# Patient Record
Sex: Female | Born: 1935
Health system: Southern US, Community
[De-identification: ages and names within clinical notes are randomized; demographics above are authoritative.]

## PROBLEM LIST (undated history)

## (undated) DIAGNOSIS — Z8619 Personal history of other infectious and parasitic diseases: Secondary | ICD-10-CM

## (undated) DIAGNOSIS — E785 Hyperlipidemia, unspecified: Secondary | ICD-10-CM

## (undated) DIAGNOSIS — C9110 Chronic lymphocytic leukemia of B-cell type not having achieved remission: Secondary | ICD-10-CM

## (undated) DIAGNOSIS — I10 Essential (primary) hypertension: Secondary | ICD-10-CM

## (undated) DIAGNOSIS — Z860100 Personal history of colon polyps, unspecified: Secondary | ICD-10-CM

## (undated) DIAGNOSIS — B0229 Other postherpetic nervous system involvement: Secondary | ICD-10-CM

## (undated) DIAGNOSIS — C73 Malignant neoplasm of thyroid gland: Secondary | ICD-10-CM

## (undated) DIAGNOSIS — B029 Zoster without complications: Secondary | ICD-10-CM

## (undated) HISTORY — DX: Hyperlipidemia, unspecified: E78.5

## (undated) HISTORY — PX: CHOLECYSTECTOMY: SHX55

## (undated) HISTORY — PX: HEMORRHOID SURGERY: SHX153

## (undated) HISTORY — PX: APPENDECTOMY: SHX54

## (undated) HISTORY — PX: ABDOMINAL HYSTERECTOMY: SHX81

## (undated) HISTORY — DX: Personal history of other infectious and parasitic diseases: Z86.19

## (undated) HISTORY — DX: Chronic lymphocytic leukemia of B-cell type not having achieved remission: C91.10

## (undated) HISTORY — PX: BREAST SURGERY: SHX581

## (undated) HISTORY — DX: Personal history of colonic polyps: Z86.010

## (undated) HISTORY — DX: Zoster without complications: B02.9

## (undated) HISTORY — DX: Essential (primary) hypertension: I10

## (undated) HISTORY — DX: Other postherpetic nervous system involvement: B02.29

## (undated) HISTORY — DX: Malignant neoplasm of thyroid gland: C73

## (undated) HISTORY — DX: Personal history of colon polyps, unspecified: Z86.0100

---

## 1996-12-28 DIAGNOSIS — C73 Malignant neoplasm of thyroid gland: Secondary | ICD-10-CM

## 1996-12-28 HISTORY — DX: Malignant neoplasm of thyroid gland: C73

## 1996-12-28 HISTORY — PX: TOTAL THYROIDECTOMY: SHX2547

## 2006-07-01 ENCOUNTER — Ambulatory Visit: Payer: Self-pay | Admitting: Gastroenterology

## 2006-10-11 ENCOUNTER — Ambulatory Visit: Payer: Self-pay | Admitting: Unknown Physician Specialty

## 2006-10-19 ENCOUNTER — Ambulatory Visit: Payer: Self-pay | Admitting: Unknown Physician Specialty

## 2008-05-14 ENCOUNTER — Observation Stay: Payer: Self-pay | Admitting: Internal Medicine

## 2008-05-14 ENCOUNTER — Other Ambulatory Visit: Payer: Self-pay

## 2008-05-18 ENCOUNTER — Ambulatory Visit: Payer: Self-pay | Admitting: Cardiovascular Disease

## 2008-05-18 HISTORY — PX: CARDIAC CATHETERIZATION: SHX172

## 2009-08-19 ENCOUNTER — Ambulatory Visit: Payer: Self-pay | Admitting: Gastroenterology

## 2009-11-18 LAB — HM COLONOSCOPY

## 2010-02-08 ENCOUNTER — Emergency Department: Payer: Self-pay | Admitting: Emergency Medicine

## 2013-05-18 ENCOUNTER — Ambulatory Visit (INDEPENDENT_AMBULATORY_CARE_PROVIDER_SITE_OTHER): Payer: Medicare Other | Admitting: General Surgery

## 2013-05-18 ENCOUNTER — Encounter: Payer: Self-pay | Admitting: General Surgery

## 2013-05-18 VITALS — BP 140/73 | HR 70 | Resp 14 | Ht 62.0 in | Wt 174.0 lb

## 2013-05-18 DIAGNOSIS — R928 Other abnormal and inconclusive findings on diagnostic imaging of breast: Secondary | ICD-10-CM

## 2013-05-18 NOTE — Progress Notes (Signed)
Patient ID: Lisa Figueroa, female   DOB: 1932/12/02, 79 y.o.   MRN: [id number]  Chief Complaint  Patient presents with  . Breast Problem    abnormal mammogram    HPI Lisa Figueroa is a 79 y.o. female.  Patient her today for breast evaluation referred by Dr Lacie Scotts.  Denies family history of breast cancer. Patient does self breast checks and gets regular mammograms. She has had prior cysts that were aspirated as well as excised several years ago. The most recent mammogram was done on 05/12/13 with a birad category 4.  HPI  Past Medical History  Diagnosis Date  . Cancer 1998    THYROID    Past Surgical History  Procedure Laterality Date  . Cholecystectomy    . Abdominal hysterectomy    . Hemorrhoid surgery      History reviewed. No pertinent family history.  Social History History  Substance Use Topics  . Smoking status: Never Smoker   . Smokeless tobacco: Not on file  . Alcohol Use: Yes     Comment: wine    Allergies  Allergen Reactions  . Prednisone     crys and can't sleep    Current Outpatient Prescriptions  Medication Sig Dispense Refill  . Cholecalciferol (HM VITAMIN D3) 2000 UNITS CAPS Take 1 capsule by mouth daily.      Marland Kitchen levothyroxine (SYNTHROID, LEVOTHROID) 125 MCG tablet Take 1 tablet by mouth daily.      Marland Kitchen losartan (COZAAR) 100 MG tablet Take 1 tablet by mouth daily.      . simvastatin (ZOCOR) 20 MG tablet Take 1 tablet by mouth daily.       No current facility-administered medications for this visit.    Review of Systems Review of Systems  Constitutional: Negative.   Respiratory: Negative.   Cardiovascular: Negative.     Blood pressure 140/73, pulse 70, resp. rate 14, height 5\' 2"  (1.575 m), weight 174 lb (78.926 kg).  Physical Exam Physical Exam  Constitutional: She appears well-developed and well-nourished.  Eyes: Conjunctivae are normal. No scleral icterus.  Neck: Trachea normal. No mass and no thyromegaly present.  Cardiovascular:  Normal rate, regular rhythm, normal heart sounds and normal pulses.   No murmur heard. Pulmonary/Chest: Effort normal and breath sounds normal. Right breast exhibits no inverted nipple, no mass, no nipple discharge, no skin change and no tenderness. Left breast exhibits no inverted nipple, no mass, no nipple discharge, no skin change and no tenderness. Breasts are symmetrical.  Abdominal: Soft. Normal appearance and bowel sounds are normal. There is no hepatosplenomegaly. There is no tenderness. No hernia.  Lymphadenopathy:    She has no cervical adenopathy.    She has no axillary adenopathy.    Data Reviewed Mammogram and ultrasound reviewed. Bilateral axillary nodes were identified with some of them showing mild increase prominence from before. None of the nodes are palpable. Breast tissue appears unremarkable. Assessment    Would like some additional opinions from other Radiologists and decision on a short term follow up or proceed with a biopsy.      Plan    Follow up to be announced within a week.         Greta Doom F 05/18/2013, 3:19 PM

## 2013-05-18 NOTE — Patient Instructions (Addendum)
The patient will be contacted by our office once Dr. Evette Cristal has gotten a chance to review findings with radiologists.

## 2013-05-28 ENCOUNTER — Ambulatory Visit: Payer: Self-pay | Admitting: Oncology

## 2013-05-31 ENCOUNTER — Ambulatory Visit: Payer: Self-pay | Admitting: Ophthalmology

## 2013-05-31 LAB — CREATININE, SERUM
Creatinine: 0.84 mg/dL (ref 0.60–1.30)
EGFR (African American): >60
EGFR (Non-African Amer.): >60

## 2013-06-01 ENCOUNTER — Ambulatory Visit (INDEPENDENT_AMBULATORY_CARE_PROVIDER_SITE_OTHER): Payer: Medicare Other | Admitting: General Surgery

## 2013-06-01 ENCOUNTER — Encounter: Payer: Self-pay | Admitting: General Surgery

## 2013-06-01 VITALS — BP 130/72 | HR 84 | Resp 12 | Ht 62.0 in | Wt 172.0 lb

## 2013-06-01 DIAGNOSIS — R2231 Localized swelling, mass and lump, right upper limb: Secondary | ICD-10-CM

## 2013-06-01 DIAGNOSIS — N63 Unspecified lump in unspecified breast: Secondary | ICD-10-CM

## 2013-06-01 DIAGNOSIS — R2230 Localized swelling, mass and lump, unspecified upper limb: Secondary | ICD-10-CM | POA: Insufficient documentation

## 2013-06-01 NOTE — Patient Instructions (Signed)
Pt will be advised when pathology available.

## 2013-06-01 NOTE — Progress Notes (Signed)
Patient ID: Gabrille Kilbride, female   DOB: 06-24-1932, 79 y.o.   MRN: [id number]  Chief Complaint  Patient presents with  . Other    FNA or core biopsy lymph node axilla    HPI Twanda Stakes is a 79 y.o. female who presents for a right breast biopsy.  HPI  Past Medical History  Diagnosis Date  . Cancer 1998    THYROID    Past Surgical History  Procedure Laterality Date  . Cholecystectomy    . Abdominal hysterectomy    . Hemorrhoid surgery    . Total thyroidectomy  1998  . Appendectomy    . Breast surgery      cyst excision    Family History  Problem Relation Age of Onset  . Cancer Brother     prostate and bone  . Cancer Maternal Aunt     breast  . Cancer Cousin     breast - maternal side    Social History History  Substance Use Topics  . Smoking status: Never Smoker   . Smokeless tobacco: Not on file  . Alcohol Use: Yes     Comment: wine    Allergies  Allergen Reactions  . Prednisone     crys and can't sleep    Current Outpatient Prescriptions  Medication Sig Dispense Refill  . Cholecalciferol (HM VITAMIN D3) 2000 UNITS CAPS Take 1 capsule by mouth daily.      Marland Kitchen levothyroxine (SYNTHROID, LEVOTHROID) 125 MCG tablet Take 1 tablet by mouth daily.      Marland Kitchen losartan (COZAAR) 100 MG tablet Take 1 tablet by mouth daily.      . simvastatin (ZOCOR) 20 MG tablet Take 1 tablet by mouth daily.       No current facility-administered medications for this visit.    Review of Systems Review of Systems  Constitutional: Negative.   Respiratory: Negative.   Cardiovascular: Negative.     Blood pressure 130/72, pulse 84, resp. rate 12, height 5\' 2"  (1.575 m), weight 172 lb (78.019 kg).  Physical Exam Physical Exam  Data Reviewed After further review of her mammogram and [place] deciced on biopsy of a node in right axillary tail area .  Assessment    Axillary adenopathy     Plan    Core biopsy completed.        SANKAR,SEEPLAPUTHUR G 06/01/2013, 8:15  PM

## 2013-06-06 ENCOUNTER — Telehealth: Payer: Self-pay | Admitting: *Deleted

## 2013-06-06 ENCOUNTER — Telehealth: Payer: Self-pay | Admitting: General Surgery

## 2013-06-06 DIAGNOSIS — C9110 Chronic lymphocytic leukemia of B-cell type not having achieved remission: Secondary | ICD-10-CM

## 2013-06-06 NOTE — Telephone Encounter (Signed)
Path report from rt axillary node suggested possible CLL. CBC done in March showed 49% lymphocytes. This report forwarded to Pathologist. Pt advised of the findings. She is agreeable to seen oncologist-her husband had seen Dr. Doylene Canning. Appt to be made with him.  Copies of path report, labs and Office notes to Dr. Doylene Canning and Dr. Lacie Scotts.

## 2013-06-06 NOTE — Telephone Encounter (Signed)
Patient has been scheduled for an appointment with Dr. Doylene Canning at the [facility]) on 06-09-13 at 9 am. She is aware of date, time, and instructions.

## 2013-06-07 ENCOUNTER — Encounter: Payer: Self-pay | Admitting: General Surgery

## 2013-06-08 LAB — PATHOLOGY

## 2013-06-09 ENCOUNTER — Ambulatory Visit: Payer: Self-pay | Admitting: Oncology

## 2013-06-09 LAB — COMPREHENSIVE METABOLIC PANEL
Alkaline Phosphatase: 72 U/L (ref 50–136)
Anion Gap: 7 (ref 7–16)
BUN: 14 mg/dL (ref 7–18)
Co2: 32 mmol/L (ref 21–32)
Creatinine: 0.74 mg/dL (ref 0.60–1.30)
EGFR (African American): >60
EGFR (Non-African Amer.): >60
Glucose: 90 mg/dL (ref 65–99)
Osmolality: 276 (ref 275–301)
SGOT(AST): 17 U/L (ref 15–37)
Sodium: 138 mmol/L (ref 136–145)

## 2013-06-09 LAB — CBC CANCER CENTER
Basophil #: 0.1 x10 3/mm (ref 0.0–0.1)
Eosinophil #: 0.1 x10 3/mm (ref 0.0–0.7)
Eosinophil %: 0.9 %
HCT: 41.6 % (ref 35.0–47.0)
Lymphocyte %: 54 %
MCHC: 32.4 g/dL (ref 32.0–36.0)
Monocyte #: 0.3 x10 3/mm (ref 0.2–0.9)
Neutrophil #: 4.4 x10 3/mm (ref 1.4–6.5)
Platelet: 178 x10 3/mm (ref 150–440)
RDW: 14.1 % (ref 11.5–14.5)

## 2013-06-27 ENCOUNTER — Inpatient Hospital Stay: Payer: Self-pay | Admitting: Student

## 2013-06-27 ENCOUNTER — Ambulatory Visit: Payer: Self-pay | Admitting: Oncology

## 2013-06-27 LAB — URINALYSIS, COMPLETE
Bacteria: NONE SEEN
Bilirubin,UR: NEGATIVE
Glucose,UR: NEGATIVE mg/dL
Ketone: NEGATIVE
Leukocyte Esterase: NEGATIVE
Nitrite: NEGATIVE
Ph: 6
Protein: 30
RBC,UR: 4 /HPF
Specific Gravity: 1.014
Squamous Epithelial: NONE SEEN
WBC UR: <1 /HPF

## 2013-06-27 LAB — COMPREHENSIVE METABOLIC PANEL
Alkaline Phosphatase: 83 U/L (ref 50–136)
Anion Gap: 5 — ABNORMAL LOW (ref 7–16)
Calcium, Total: 9.1 mg/dL (ref 8.5–10.1)
Co2: 29 mmol/L (ref 21–32)
Creatinine: 0.78 mg/dL (ref 0.60–1.30)
EGFR (African American): >60
EGFR (Non-African Amer.): >60
Glucose: 110 mg/dL — ABNORMAL HIGH (ref 65–99)
Osmolality: 269 (ref 275–301)
Potassium: 3.9 mmol/L (ref 3.5–5.1)
SGOT(AST): 27 U/L (ref 15–37)
Sodium: 134 mmol/L — ABNORMAL LOW (ref 136–145)
Total Protein: 7.5 g/dL (ref 6.4–8.2)

## 2013-06-27 LAB — TSH: Thyroid Stimulating Horm: 0.984 u[IU]/mL

## 2013-06-27 LAB — CBC
HCT: 42.1 %
HGB: 13.9 g/dL
MCH: 28.2 pg
MCHC: 32.9 g/dL
MCV: 86 fL
Platelet: 174 x10 3/mm 3
RBC: 4.92 X10 6/mm 3
RDW: 13.9 %
WBC: 22.8 x10 3/mm 3 — ABNORMAL HIGH

## 2013-06-27 LAB — PROTIME-INR
INR: 1
Prothrombin Time: 13.8 s

## 2013-06-28 LAB — CBC WITH DIFFERENTIAL/PLATELET
Basophil #: 0 10*3/uL (ref 0.0–0.1)
Eosinophil #: 0 10*3/uL (ref 0.0–0.7)
Eosinophil %: 0.5 %
HCT: 33.8 % — ABNORMAL LOW (ref 35.0–47.0)
Lymphocyte %: 39.6 %
MCH: 29.3 pg (ref 26.0–34.0)
MCHC: 34.1 g/dL (ref 32.0–36.0)
MCV: 86 fL (ref 80–100)
Monocyte #: 0.4 x10 3/mm (ref 0.2–0.9)
Monocyte %: 5.4 %
Platelet: 128 10*3/uL — ABNORMAL LOW (ref 150–440)
RBC: 3.93 10*6/uL (ref 3.80–5.20)
RDW: 14.1 % (ref 11.5–14.5)
WBC: 7.4 10*3/uL (ref 3.6–11.0)

## 2013-06-28 LAB — BASIC METABOLIC PANEL
Anion Gap: 3 — ABNORMAL LOW (ref 7–16)
BUN: 14 mg/dL (ref 7–18)
Chloride: 104 mmol/L (ref 98–107)
Co2: 31 mmol/L (ref 21–32)
Creatinine: 0.9 mg/dL (ref 0.60–1.30)
EGFR (Non-African Amer.): >60
Osmolality: 276 (ref 275–301)
Potassium: 4.4 mmol/L (ref 3.5–5.1)
Sodium: 138 mmol/L (ref 136–145)

## 2013-07-02 LAB — PLATELET COUNT: Platelet: 140 10*3/uL — ABNORMAL LOW (ref 150–440)

## 2013-07-04 ENCOUNTER — Encounter: Payer: Self-pay | Admitting: Internal Medicine

## 2013-07-04 ENCOUNTER — Ambulatory Visit: Payer: Self-pay | Admitting: Oncology

## 2013-07-10 LAB — URINALYSIS, COMPLETE
Bacteria: NONE SEEN
Bilirubin,UR: NEGATIVE
Glucose,UR: NEGATIVE mg/dL (ref 0–75)
Ketone: NEGATIVE
Leukocyte Esterase: NEGATIVE
Nitrite: NEGATIVE
Ph: 7 (ref 4.5–8.0)
Squamous Epithelial: 1

## 2013-07-28 ENCOUNTER — Encounter: Payer: Self-pay | Admitting: Internal Medicine

## 2013-09-05 ENCOUNTER — Ambulatory Visit: Payer: Self-pay | Admitting: Oncology

## 2013-09-07 ENCOUNTER — Ambulatory Visit: Payer: Self-pay | Admitting: Oncology

## 2013-09-07 LAB — CBC CANCER CENTER
Basophil #: 0.1 x10 3/mm (ref 0.0–0.1)
Basophil %: 0.8 %
Eosinophil #: 0.2 x10 3/mm (ref 0.0–0.7)
Eosinophil %: 1.3 %
HCT: 43.3 % (ref 35.0–47.0)
HGB: 14.2 g/dL (ref 12.0–16.0)
Lymphocyte #: 5.6 x10 3/mm — ABNORMAL HIGH (ref 1.0–3.6)
Lymphocyte %: 49.2 %
Monocyte #: 0.5 x10 3/mm (ref 0.2–0.9)
Neutrophil %: 44.3 %
Platelet: 221 x10 3/mm (ref 150–440)
WBC: 11.4 x10 3/mm — ABNORMAL HIGH (ref 3.6–11.0)

## 2013-09-07 LAB — COMPREHENSIVE METABOLIC PANEL
Albumin: 3.9 g/dL (ref 3.4–5.0)
Anion Gap: 7 (ref 7–16)
BUN: 9 mg/dL (ref 7–18)
Calcium, Total: 9.4 mg/dL (ref 8.5–10.1)
Creatinine: 0.78 mg/dL (ref 0.60–1.30)
EGFR (African American): >60
EGFR (Non-African Amer.): >60
Glucose: 83 mg/dL (ref 65–99)
Osmolality: 266 (ref 275–301)
Potassium: 3.9 mmol/L (ref 3.5–5.1)
SGOT(AST): 20 U/L (ref 15–37)
SGPT (ALT): 14 U/L (ref 12–78)
Sodium: 134 mmol/L — ABNORMAL LOW (ref 136–145)
Total Protein: 7.8 g/dL (ref 6.4–8.2)

## 2013-09-27 ENCOUNTER — Ambulatory Visit: Payer: Self-pay | Admitting: Oncology

## 2014-01-12 DIAGNOSIS — J209 Acute bronchitis, unspecified: Secondary | ICD-10-CM | POA: Diagnosis not present

## 2014-01-12 DIAGNOSIS — J449 Chronic obstructive pulmonary disease, unspecified: Secondary | ICD-10-CM | POA: Diagnosis not present

## 2014-01-12 DIAGNOSIS — J019 Acute sinusitis, unspecified: Secondary | ICD-10-CM | POA: Diagnosis not present

## 2014-01-19 DIAGNOSIS — E7800 Pure hypercholesterolemia, unspecified: Secondary | ICD-10-CM | POA: Diagnosis not present

## 2014-01-19 DIAGNOSIS — E785 Hyperlipidemia, unspecified: Secondary | ICD-10-CM | POA: Diagnosis not present

## 2014-01-19 DIAGNOSIS — E559 Vitamin D deficiency, unspecified: Secondary | ICD-10-CM | POA: Diagnosis not present

## 2014-01-19 DIAGNOSIS — R5381 Other malaise: Secondary | ICD-10-CM | POA: Diagnosis not present

## 2014-01-19 DIAGNOSIS — M810 Age-related osteoporosis without current pathological fracture: Secondary | ICD-10-CM | POA: Diagnosis not present

## 2014-01-19 DIAGNOSIS — I10 Essential (primary) hypertension: Secondary | ICD-10-CM | POA: Diagnosis not present

## 2014-01-19 DIAGNOSIS — J449 Chronic obstructive pulmonary disease, unspecified: Secondary | ICD-10-CM | POA: Diagnosis not present

## 2014-01-19 DIAGNOSIS — R5383 Other fatigue: Secondary | ICD-10-CM | POA: Diagnosis not present

## 2014-01-19 LAB — TSH: TSH: 2.79 u[IU]/mL (ref <=5.90)

## 2014-01-22 DIAGNOSIS — R918 Other nonspecific abnormal finding of lung field: Secondary | ICD-10-CM | POA: Diagnosis not present

## 2014-01-22 DIAGNOSIS — M949 Disorder of cartilage, unspecified: Secondary | ICD-10-CM | POA: Diagnosis not present

## 2014-01-22 DIAGNOSIS — M4000 Postural kyphosis, site unspecified: Secondary | ICD-10-CM | POA: Diagnosis not present

## 2014-01-22 DIAGNOSIS — M899 Disorder of bone, unspecified: Secondary | ICD-10-CM | POA: Diagnosis not present

## 2014-01-23 DIAGNOSIS — J209 Acute bronchitis, unspecified: Secondary | ICD-10-CM | POA: Diagnosis not present

## 2014-01-23 DIAGNOSIS — J449 Chronic obstructive pulmonary disease, unspecified: Secondary | ICD-10-CM | POA: Diagnosis not present

## 2014-01-30 DIAGNOSIS — I719 Aortic aneurysm of unspecified site, without rupture: Secondary | ICD-10-CM | POA: Diagnosis not present

## 2014-01-30 DIAGNOSIS — B029 Zoster without complications: Secondary | ICD-10-CM | POA: Diagnosis not present

## 2014-01-30 DIAGNOSIS — I739 Peripheral vascular disease, unspecified: Secondary | ICD-10-CM | POA: Diagnosis not present

## 2014-01-30 DIAGNOSIS — N289 Disorder of kidney and ureter, unspecified: Secondary | ICD-10-CM | POA: Diagnosis not present

## 2014-02-05 DIAGNOSIS — I719 Aortic aneurysm of unspecified site, without rupture: Secondary | ICD-10-CM | POA: Diagnosis not present

## 2014-02-05 DIAGNOSIS — B029 Zoster without complications: Secondary | ICD-10-CM | POA: Diagnosis not present

## 2014-02-05 DIAGNOSIS — I739 Peripheral vascular disease, unspecified: Secondary | ICD-10-CM | POA: Diagnosis not present

## 2014-02-05 DIAGNOSIS — E785 Hyperlipidemia, unspecified: Secondary | ICD-10-CM | POA: Diagnosis not present

## 2014-02-05 DIAGNOSIS — I10 Essential (primary) hypertension: Secondary | ICD-10-CM | POA: Diagnosis not present

## 2014-02-05 DIAGNOSIS — J449 Chronic obstructive pulmonary disease, unspecified: Secondary | ICD-10-CM | POA: Diagnosis not present

## 2014-02-07 ENCOUNTER — Inpatient Hospital Stay: Payer: Self-pay | Admitting: Internal Medicine

## 2014-02-07 DIAGNOSIS — B372 Candidiasis of skin and nail: Secondary | ICD-10-CM | POA: Diagnosis present

## 2014-02-07 DIAGNOSIS — E785 Hyperlipidemia, unspecified: Secondary | ICD-10-CM | POA: Diagnosis present

## 2014-02-07 DIAGNOSIS — E890 Postprocedural hypothyroidism: Secondary | ICD-10-CM | POA: Diagnosis present

## 2014-02-07 DIAGNOSIS — I10 Essential (primary) hypertension: Secondary | ICD-10-CM | POA: Diagnosis present

## 2014-02-07 DIAGNOSIS — C9110 Chronic lymphocytic leukemia of B-cell type not having achieved remission: Secondary | ICD-10-CM | POA: Diagnosis present

## 2014-02-07 DIAGNOSIS — B0229 Other postherpetic nervous system involvement: Secondary | ICD-10-CM | POA: Diagnosis present

## 2014-02-07 DIAGNOSIS — Z886 Allergy status to analgesic agent status: Secondary | ICD-10-CM | POA: Diagnosis not present

## 2014-02-07 DIAGNOSIS — L0390 Cellulitis, unspecified: Secondary | ICD-10-CM | POA: Diagnosis not present

## 2014-02-07 DIAGNOSIS — L02219 Cutaneous abscess of trunk, unspecified: Secondary | ICD-10-CM | POA: Diagnosis not present

## 2014-02-07 DIAGNOSIS — L0291 Cutaneous abscess, unspecified: Secondary | ICD-10-CM | POA: Diagnosis not present

## 2014-02-07 DIAGNOSIS — Z85850 Personal history of malignant neoplasm of thyroid: Secondary | ICD-10-CM | POA: Diagnosis not present

## 2014-02-07 DIAGNOSIS — B029 Zoster without complications: Secondary | ICD-10-CM | POA: Diagnosis not present

## 2014-02-07 DIAGNOSIS — E039 Hypothyroidism, unspecified: Secondary | ICD-10-CM | POA: Diagnosis not present

## 2014-02-07 DIAGNOSIS — K5900 Constipation, unspecified: Secondary | ICD-10-CM | POA: Diagnosis not present

## 2014-02-07 LAB — CBC WITH DIFFERENTIAL/PLATELET
Basophil: 1 %
COMMENT - H1-COM2: NORMAL
Comment - H1-Com1: NORMAL
Eosinophil: 2 %
HCT: 42.2 % (ref 35.0–47.0)
HGB: 14.5 g/dL (ref 12.0–16.0)
LYMPHS PCT: 28 %
MCH: 29.3 pg (ref 26.0–34.0)
MCHC: 34.2 g/dL (ref 32.0–36.0)
MCV: 86 fL (ref 80–100)
Monocytes: 7 %
PLATELETS: 159 10*3/uL (ref 150–440)
RBC: 4.93 10*6/uL (ref 3.80–5.20)
RDW: 13.6 % (ref 11.5–14.5)
Segmented Neutrophils: 57 %
VARIANT LYMPHOCYTE - H1-RLYMPH: 5 %
WBC: 10.7 10*3/uL (ref 3.6–11.0)

## 2014-02-07 LAB — COMPREHENSIVE METABOLIC PANEL
ALBUMIN: 3.5 g/dL (ref 3.4–5.0)
ALK PHOS: 94 U/L
ALT: 11 U/L — AB (ref 12–78)
Anion Gap: 5 — ABNORMAL LOW (ref 7–16)
BILIRUBIN TOTAL: 0.5 mg/dL (ref 0.2–1.0)
BUN: 13 mg/dL (ref 7–18)
CO2: 31 mmol/L (ref 21–32)
Calcium, Total: 9 mg/dL (ref 8.5–10.1)
Chloride: 93 mmol/L — ABNORMAL LOW (ref 98–107)
Creatinine: 0.88 mg/dL (ref 0.60–1.30)
EGFR (African American): >60
EGFR (Non-African Amer.): >60
Glucose: 97 mg/dL (ref 65–99)
Osmolality: 259 (ref 275–301)
Potassium: 4.1 mmol/L (ref 3.5–5.1)
SGOT(AST): 13 U/L — ABNORMAL LOW (ref 15–37)
SODIUM: 129 mmol/L — AB (ref 136–145)
TOTAL PROTEIN: 7.6 g/dL (ref 6.4–8.2)

## 2014-02-08 DIAGNOSIS — I10 Essential (primary) hypertension: Secondary | ICD-10-CM | POA: Diagnosis not present

## 2014-02-08 DIAGNOSIS — L0291 Cutaneous abscess, unspecified: Secondary | ICD-10-CM | POA: Diagnosis not present

## 2014-02-08 DIAGNOSIS — C9110 Chronic lymphocytic leukemia of B-cell type not having achieved remission: Secondary | ICD-10-CM | POA: Diagnosis not present

## 2014-02-08 DIAGNOSIS — B029 Zoster without complications: Secondary | ICD-10-CM | POA: Diagnosis not present

## 2014-02-08 DIAGNOSIS — L02219 Cutaneous abscess of trunk, unspecified: Secondary | ICD-10-CM | POA: Diagnosis not present

## 2014-02-08 LAB — CBC WITH DIFFERENTIAL/PLATELET
Basophil: 1 %
EOS PCT: 1 %
HCT: 41.3 % (ref 35.0–47.0)
HGB: 14 g/dL (ref 12.0–16.0)
LYMPHS PCT: 28 %
MCH: 29 pg (ref 26.0–34.0)
MCHC: 33.9 g/dL (ref 32.0–36.0)
MCV: 86 fL (ref 80–100)
Monocytes: 8 %
Platelet: 164 10*3/uL (ref 150–440)
RBC: 4.83 10*6/uL (ref 3.80–5.20)
RDW: 13.6 % (ref 11.5–14.5)
Segmented Neutrophils: 50 %
Variant Lymphocyte - H1-Rlymph: 12 %
WBC: 13.5 10*3/uL — ABNORMAL HIGH (ref 3.6–11.0)

## 2014-02-08 LAB — BASIC METABOLIC PANEL
Anion Gap: 6 — ABNORMAL LOW (ref 7–16)
BUN: 12 mg/dL (ref 4–21)
BUN: 7 mg/dL (ref 7–18)
CALCIUM: 8.7 mg/dL (ref 8.5–10.1)
CHLORIDE: 93 mmol/L — AB (ref 98–107)
Co2: 30 mmol/L (ref 21–32)
Creatinine: 0.69 mg/dL (ref 0.60–1.30)
Creatinine: 0.7 mg/dL (ref <=1.1)
EGFR (Non-African Amer.): >60
GLUCOSE: 89 mg/dL
Glucose: 75 mg/dL (ref 65–99)
Osmolality: 256 (ref 275–301)
Potassium: 3.9 mmol/L (ref 3.4–5.3)
Potassium: 4 mmol/L (ref 3.5–5.1)
SODIUM: 139 mmol/L (ref 137–147)
Sodium: 129 mmol/L — ABNORMAL LOW (ref 136–145)

## 2014-02-08 LAB — CBC AND DIFFERENTIAL
HEMATOCRIT: 41 % (ref 36–46)
Hemoglobin: 13.2 g/dL (ref 12.0–16.0)
Platelets: 175 10*3/uL (ref 150–399)
WBC: 10.8 10^3/mL

## 2014-02-08 LAB — HEPATIC FUNCTION PANEL
ALK PHOS: 67 U/L (ref 25–125)
ALT: 14 U/L (ref 7–35)
AST: 13 U/L (ref 13–35)
BILIRUBIN, TOTAL: 0.6 mg/dL

## 2014-02-09 DIAGNOSIS — L0291 Cutaneous abscess, unspecified: Secondary | ICD-10-CM | POA: Diagnosis not present

## 2014-02-09 DIAGNOSIS — I10 Essential (primary) hypertension: Secondary | ICD-10-CM | POA: Diagnosis not present

## 2014-02-09 DIAGNOSIS — C9110 Chronic lymphocytic leukemia of B-cell type not having achieved remission: Secondary | ICD-10-CM | POA: Diagnosis not present

## 2014-02-09 DIAGNOSIS — B029 Zoster without complications: Secondary | ICD-10-CM | POA: Diagnosis not present

## 2014-02-09 LAB — CBC WITH DIFFERENTIAL/PLATELET
COMMENT - H1-COM2: NORMAL
EOS PCT: 8 %
HCT: 42.2 % (ref 35.0–47.0)
HGB: 14 g/dL (ref 12.0–16.0)
LYMPHS PCT: 35 %
MCH: 28.5 pg (ref 26.0–34.0)
MCHC: 33.2 g/dL (ref 32.0–36.0)
MCV: 86 fL (ref 80–100)
Monocytes: 9 %
Platelet: 163 10*3/uL (ref 150–440)
RBC: 4.92 10*6/uL (ref 3.80–5.20)
RDW: 13.9 % (ref 11.5–14.5)
SEGMENTED NEUTROPHILS: 41 %
VARIANT LYMPHOCYTE - H1-RLYMPH: 7 %
WBC: 12.1 10*3/uL — AB (ref 3.6–11.0)

## 2014-02-09 LAB — BASIC METABOLIC PANEL
ANION GAP: 3 — AB (ref 7–16)
BUN: 11 mg/dL (ref 7–18)
Calcium, Total: 8.7 mg/dL (ref 8.5–10.1)
Chloride: 95 mmol/L — ABNORMAL LOW (ref 98–107)
Co2: 31 mmol/L (ref 21–32)
Creatinine: 0.81 mg/dL (ref 0.60–1.30)
EGFR (African American): >60
EGFR (Non-African Amer.): >60
Glucose: 82 mg/dL (ref 65–99)
OSMOLALITY: 257 (ref 275–301)
POTASSIUM: 3.8 mmol/L (ref 3.5–5.1)
SODIUM: 129 mmol/L — AB (ref 136–145)

## 2014-02-10 DIAGNOSIS — B029 Zoster without complications: Secondary | ICD-10-CM | POA: Diagnosis not present

## 2014-02-10 DIAGNOSIS — E039 Hypothyroidism, unspecified: Secondary | ICD-10-CM | POA: Diagnosis not present

## 2014-02-10 DIAGNOSIS — C9110 Chronic lymphocytic leukemia of B-cell type not having achieved remission: Secondary | ICD-10-CM | POA: Diagnosis not present

## 2014-02-10 DIAGNOSIS — L0291 Cutaneous abscess, unspecified: Secondary | ICD-10-CM | POA: Diagnosis not present

## 2014-02-10 DIAGNOSIS — I10 Essential (primary) hypertension: Secondary | ICD-10-CM | POA: Diagnosis not present

## 2014-02-11 DIAGNOSIS — I10 Essential (primary) hypertension: Secondary | ICD-10-CM | POA: Diagnosis not present

## 2014-02-11 DIAGNOSIS — E039 Hypothyroidism, unspecified: Secondary | ICD-10-CM | POA: Diagnosis not present

## 2014-02-11 DIAGNOSIS — L0291 Cutaneous abscess, unspecified: Secondary | ICD-10-CM | POA: Diagnosis not present

## 2014-02-11 DIAGNOSIS — C9110 Chronic lymphocytic leukemia of B-cell type not having achieved remission: Secondary | ICD-10-CM | POA: Diagnosis not present

## 2014-02-11 DIAGNOSIS — B029 Zoster without complications: Secondary | ICD-10-CM | POA: Diagnosis not present

## 2014-02-11 LAB — BASIC METABOLIC PANEL
Anion Gap: 5 — ABNORMAL LOW (ref 7–16)
BUN: 8 mg/dL (ref 7–18)
CALCIUM: 8.3 mg/dL — AB (ref 8.5–10.1)
CHLORIDE: 97 mmol/L — AB (ref 98–107)
Co2: 31 mmol/L (ref 21–32)
Creatinine: 0.73 mg/dL (ref 0.60–1.30)
EGFR (African American): >60
GLUCOSE: 83 mg/dL (ref 65–99)
OSMOLALITY: 264 (ref 275–301)
Potassium: 3.5 mmol/L (ref 3.5–5.1)
SODIUM: 133 mmol/L — AB (ref 136–145)

## 2014-02-12 DIAGNOSIS — L02219 Cutaneous abscess of trunk, unspecified: Secondary | ICD-10-CM | POA: Diagnosis not present

## 2014-02-12 DIAGNOSIS — L0291 Cutaneous abscess, unspecified: Secondary | ICD-10-CM | POA: Diagnosis not present

## 2014-02-12 DIAGNOSIS — I10 Essential (primary) hypertension: Secondary | ICD-10-CM | POA: Diagnosis not present

## 2014-02-12 DIAGNOSIS — C9110 Chronic lymphocytic leukemia of B-cell type not having achieved remission: Secondary | ICD-10-CM | POA: Diagnosis not present

## 2014-02-12 DIAGNOSIS — E039 Hypothyroidism, unspecified: Secondary | ICD-10-CM | POA: Diagnosis not present

## 2014-02-12 DIAGNOSIS — B029 Zoster without complications: Secondary | ICD-10-CM | POA: Diagnosis not present

## 2014-02-13 DIAGNOSIS — L0291 Cutaneous abscess, unspecified: Secondary | ICD-10-CM | POA: Diagnosis not present

## 2014-02-13 DIAGNOSIS — B029 Zoster without complications: Secondary | ICD-10-CM | POA: Diagnosis not present

## 2014-02-13 DIAGNOSIS — C9110 Chronic lymphocytic leukemia of B-cell type not having achieved remission: Secondary | ICD-10-CM | POA: Diagnosis not present

## 2014-02-13 DIAGNOSIS — I10 Essential (primary) hypertension: Secondary | ICD-10-CM | POA: Diagnosis not present

## 2014-02-13 DIAGNOSIS — E039 Hypothyroidism, unspecified: Secondary | ICD-10-CM | POA: Diagnosis not present

## 2014-02-14 DIAGNOSIS — B029 Zoster without complications: Secondary | ICD-10-CM | POA: Diagnosis not present

## 2014-02-14 DIAGNOSIS — C9110 Chronic lymphocytic leukemia of B-cell type not having achieved remission: Secondary | ICD-10-CM | POA: Diagnosis not present

## 2014-02-14 DIAGNOSIS — L0291 Cutaneous abscess, unspecified: Secondary | ICD-10-CM | POA: Diagnosis not present

## 2014-02-14 DIAGNOSIS — E039 Hypothyroidism, unspecified: Secondary | ICD-10-CM | POA: Diagnosis not present

## 2014-02-14 DIAGNOSIS — I10 Essential (primary) hypertension: Secondary | ICD-10-CM | POA: Diagnosis not present

## 2014-02-14 LAB — CBC WITH DIFFERENTIAL/PLATELET
Basophil: 2 %
Comment - H1-Com1: NORMAL
EOS PCT: 3 %
HCT: 40 % (ref 35.0–47.0)
HGB: 13.3 g/dL (ref 12.0–16.0)
Lymphocytes: 59 %
MCH: 29.2 pg (ref 26.0–34.0)
MCHC: 33.2 g/dL (ref 32.0–36.0)
MCV: 88 fL (ref 80–100)
Monocytes: 6 %
Platelet: 202 10*3/uL (ref 150–440)
RBC: 4.55 10*6/uL (ref 3.80–5.20)
RDW: 14.1 % (ref 11.5–14.5)
SEGMENTED NEUTROPHILS: 25 %
VARIANT LYMPHOCYTE - H1-RLYMPH: 5 %
WBC: 14.8 10*3/uL — ABNORMAL HIGH (ref 3.6–11.0)

## 2014-02-14 LAB — BASIC METABOLIC PANEL
Anion Gap: 3 — ABNORMAL LOW (ref 7–16)
BUN: 10 mg/dL (ref 7–18)
CHLORIDE: 98 mmol/L (ref 98–107)
CO2: 33 mmol/L — AB (ref 21–32)
Calcium, Total: 9.1 mg/dL (ref 8.5–10.1)
Creatinine: 0.84 mg/dL (ref 0.60–1.30)
EGFR (Non-African Amer.): >60
GLUCOSE: 84 mg/dL (ref 65–99)
Osmolality: 266 (ref 275–301)
Potassium: 4.1 mmol/L (ref 3.5–5.1)
Sodium: 134 mmol/L — ABNORMAL LOW (ref 136–145)

## 2014-02-16 DIAGNOSIS — B029 Zoster without complications: Secondary | ICD-10-CM | POA: Diagnosis not present

## 2014-02-16 DIAGNOSIS — E785 Hyperlipidemia, unspecified: Secondary | ICD-10-CM | POA: Diagnosis not present

## 2014-02-16 DIAGNOSIS — L03319 Cellulitis of trunk, unspecified: Secondary | ICD-10-CM | POA: Diagnosis not present

## 2014-02-16 DIAGNOSIS — L02219 Cutaneous abscess of trunk, unspecified: Secondary | ICD-10-CM | POA: Diagnosis not present

## 2014-02-16 DIAGNOSIS — I10 Essential (primary) hypertension: Secondary | ICD-10-CM | POA: Diagnosis not present

## 2014-02-16 DIAGNOSIS — C9110 Chronic lymphocytic leukemia of B-cell type not having achieved remission: Secondary | ICD-10-CM | POA: Diagnosis not present

## 2014-02-20 DIAGNOSIS — E785 Hyperlipidemia, unspecified: Secondary | ICD-10-CM | POA: Diagnosis not present

## 2014-02-20 DIAGNOSIS — B029 Zoster without complications: Secondary | ICD-10-CM | POA: Diagnosis not present

## 2014-02-20 DIAGNOSIS — L03319 Cellulitis of trunk, unspecified: Secondary | ICD-10-CM | POA: Diagnosis not present

## 2014-02-20 DIAGNOSIS — I10 Essential (primary) hypertension: Secondary | ICD-10-CM | POA: Diagnosis not present

## 2014-02-20 DIAGNOSIS — C9110 Chronic lymphocytic leukemia of B-cell type not having achieved remission: Secondary | ICD-10-CM | POA: Diagnosis not present

## 2014-02-20 DIAGNOSIS — L02219 Cutaneous abscess of trunk, unspecified: Secondary | ICD-10-CM | POA: Diagnosis not present

## 2014-02-23 DIAGNOSIS — E785 Hyperlipidemia, unspecified: Secondary | ICD-10-CM | POA: Diagnosis not present

## 2014-02-23 DIAGNOSIS — C9110 Chronic lymphocytic leukemia of B-cell type not having achieved remission: Secondary | ICD-10-CM | POA: Diagnosis not present

## 2014-02-23 DIAGNOSIS — B029 Zoster without complications: Secondary | ICD-10-CM | POA: Diagnosis not present

## 2014-02-23 DIAGNOSIS — M129 Arthropathy, unspecified: Secondary | ICD-10-CM | POA: Diagnosis not present

## 2014-02-23 DIAGNOSIS — L02219 Cutaneous abscess of trunk, unspecified: Secondary | ICD-10-CM | POA: Diagnosis not present

## 2014-02-23 DIAGNOSIS — J209 Acute bronchitis, unspecified: Secondary | ICD-10-CM | POA: Diagnosis not present

## 2014-02-23 DIAGNOSIS — I10 Essential (primary) hypertension: Secondary | ICD-10-CM | POA: Diagnosis not present

## 2014-02-23 DIAGNOSIS — M810 Age-related osteoporosis without current pathological fracture: Secondary | ICD-10-CM | POA: Diagnosis not present

## 2014-02-26 DIAGNOSIS — I10 Essential (primary) hypertension: Secondary | ICD-10-CM | POA: Diagnosis not present

## 2014-02-26 DIAGNOSIS — B029 Zoster without complications: Secondary | ICD-10-CM | POA: Diagnosis not present

## 2014-02-26 DIAGNOSIS — L02219 Cutaneous abscess of trunk, unspecified: Secondary | ICD-10-CM | POA: Diagnosis not present

## 2014-02-26 DIAGNOSIS — E785 Hyperlipidemia, unspecified: Secondary | ICD-10-CM | POA: Diagnosis not present

## 2014-02-26 DIAGNOSIS — C9110 Chronic lymphocytic leukemia of B-cell type not having achieved remission: Secondary | ICD-10-CM | POA: Diagnosis not present

## 2014-03-01 DIAGNOSIS — B029 Zoster without complications: Secondary | ICD-10-CM | POA: Diagnosis not present

## 2014-03-06 DIAGNOSIS — C9110 Chronic lymphocytic leukemia of B-cell type not having achieved remission: Secondary | ICD-10-CM | POA: Diagnosis not present

## 2014-03-06 DIAGNOSIS — I10 Essential (primary) hypertension: Secondary | ICD-10-CM | POA: Diagnosis not present

## 2014-03-06 DIAGNOSIS — B029 Zoster without complications: Secondary | ICD-10-CM | POA: Diagnosis not present

## 2014-03-06 DIAGNOSIS — E785 Hyperlipidemia, unspecified: Secondary | ICD-10-CM | POA: Diagnosis not present

## 2014-03-06 DIAGNOSIS — L02219 Cutaneous abscess of trunk, unspecified: Secondary | ICD-10-CM | POA: Diagnosis not present

## 2014-03-09 DIAGNOSIS — K5900 Constipation, unspecified: Secondary | ICD-10-CM | POA: Diagnosis not present

## 2014-03-09 DIAGNOSIS — Z0000 Encounter for general adult medical examination without abnormal findings: Secondary | ICD-10-CM | POA: Diagnosis not present

## 2014-03-09 DIAGNOSIS — F4321 Adjustment disorder with depressed mood: Secondary | ICD-10-CM | POA: Diagnosis not present

## 2014-03-09 DIAGNOSIS — M129 Arthropathy, unspecified: Secondary | ICD-10-CM | POA: Diagnosis not present

## 2014-03-09 DIAGNOSIS — B029 Zoster without complications: Secondary | ICD-10-CM | POA: Diagnosis not present

## 2014-03-09 DIAGNOSIS — M810 Age-related osteoporosis without current pathological fracture: Secondary | ICD-10-CM | POA: Diagnosis not present

## 2014-03-14 DIAGNOSIS — C9110 Chronic lymphocytic leukemia of B-cell type not having achieved remission: Secondary | ICD-10-CM | POA: Diagnosis not present

## 2014-03-14 DIAGNOSIS — I10 Essential (primary) hypertension: Secondary | ICD-10-CM | POA: Diagnosis not present

## 2014-03-14 DIAGNOSIS — E785 Hyperlipidemia, unspecified: Secondary | ICD-10-CM | POA: Diagnosis not present

## 2014-03-14 DIAGNOSIS — B029 Zoster without complications: Secondary | ICD-10-CM | POA: Diagnosis not present

## 2014-03-14 DIAGNOSIS — L02219 Cutaneous abscess of trunk, unspecified: Secondary | ICD-10-CM | POA: Diagnosis not present

## 2014-03-20 ENCOUNTER — Ambulatory Visit: Payer: Self-pay | Admitting: Oncology

## 2014-03-20 DIAGNOSIS — C9110 Chronic lymphocytic leukemia of B-cell type not having achieved remission: Secondary | ICD-10-CM | POA: Diagnosis not present

## 2014-03-20 DIAGNOSIS — Z85850 Personal history of malignant neoplasm of thyroid: Secondary | ICD-10-CM | POA: Diagnosis not present

## 2014-03-20 DIAGNOSIS — Z79899 Other long term (current) drug therapy: Secondary | ICD-10-CM | POA: Diagnosis not present

## 2014-03-20 DIAGNOSIS — R599 Enlarged lymph nodes, unspecified: Secondary | ICD-10-CM | POA: Diagnosis not present

## 2014-03-20 DIAGNOSIS — F411 Generalized anxiety disorder: Secondary | ICD-10-CM | POA: Diagnosis not present

## 2014-03-20 DIAGNOSIS — Z803 Family history of malignant neoplasm of breast: Secondary | ICD-10-CM | POA: Diagnosis not present

## 2014-03-20 DIAGNOSIS — B029 Zoster without complications: Secondary | ICD-10-CM | POA: Diagnosis not present

## 2014-03-21 DIAGNOSIS — C9110 Chronic lymphocytic leukemia of B-cell type not having achieved remission: Secondary | ICD-10-CM | POA: Diagnosis not present

## 2014-03-21 LAB — CBC CANCER CENTER
BASOS ABS: 0.1 x10 3/mm (ref 0.0–0.1)
Basophil %: 0.5 %
EOS PCT: 2.1 %
Eosinophil #: 0.3 x10 3/mm (ref 0.0–0.7)
HCT: 43 % (ref 35.0–47.0)
HGB: 13.6 g/dL (ref 12.0–16.0)
Lymphocyte #: 7.2 x10 3/mm — ABNORMAL HIGH (ref 1.0–3.6)
Lymphocyte %: 60.9 %
MCH: 28 pg (ref 26.0–34.0)
MCHC: 31.7 g/dL — ABNORMAL LOW (ref 32.0–36.0)
MCV: 88 fL (ref 80–100)
Monocyte #: 0.5 x10 3/mm (ref 0.2–0.9)
Monocyte %: 4.2 %
Neutrophil #: 3.8 x10 3/mm (ref 1.4–6.5)
Neutrophil %: 32.3 %
Platelet: 168 x10 3/mm (ref 150–440)
RBC: 4.87 10*6/uL (ref 3.80–5.20)
RDW: 15.4 % — ABNORMAL HIGH (ref 11.5–14.5)
WBC: 11.8 x10 3/mm — ABNORMAL HIGH (ref 3.6–11.0)

## 2014-03-21 LAB — COMPREHENSIVE METABOLIC PANEL
ALBUMIN: 3.9 g/dL (ref 3.4–5.0)
Alkaline Phosphatase: 70 U/L
Anion Gap: 5 — ABNORMAL LOW (ref 7–16)
BUN: 11 mg/dL (ref 7–18)
Bilirubin,Total: 0.5 mg/dL (ref 0.2–1.0)
Calcium, Total: 9.2 mg/dL (ref 8.5–10.1)
Chloride: 96 mmol/L — ABNORMAL LOW (ref 98–107)
Co2: 33 mmol/L — ABNORMAL HIGH (ref 21–32)
Creatinine: 0.92 mg/dL (ref 0.60–1.30)
GFR CALC NON AF AMER: 59 — AB
GLUCOSE: 81 mg/dL (ref 65–99)
Osmolality: 267 (ref 275–301)
Potassium: 4.2 mmol/L (ref 3.5–5.1)
SGOT(AST): 15 U/L (ref 15–37)
SGPT (ALT): 12 U/L (ref 12–78)
Sodium: 134 mmol/L — ABNORMAL LOW (ref 136–145)
Total Protein: 7.5 g/dL (ref 6.4–8.2)

## 2014-03-23 DIAGNOSIS — B029 Zoster without complications: Secondary | ICD-10-CM | POA: Diagnosis not present

## 2014-03-23 DIAGNOSIS — N318 Other neuromuscular dysfunction of bladder: Secondary | ICD-10-CM | POA: Diagnosis not present

## 2014-03-23 DIAGNOSIS — F4321 Adjustment disorder with depressed mood: Secondary | ICD-10-CM | POA: Diagnosis not present

## 2014-03-23 DIAGNOSIS — K5900 Constipation, unspecified: Secondary | ICD-10-CM | POA: Diagnosis not present

## 2014-03-23 DIAGNOSIS — I10 Essential (primary) hypertension: Secondary | ICD-10-CM | POA: Diagnosis not present

## 2014-03-26 DIAGNOSIS — I10 Essential (primary) hypertension: Secondary | ICD-10-CM | POA: Diagnosis not present

## 2014-03-26 DIAGNOSIS — E785 Hyperlipidemia, unspecified: Secondary | ICD-10-CM | POA: Diagnosis not present

## 2014-03-26 DIAGNOSIS — L02219 Cutaneous abscess of trunk, unspecified: Secondary | ICD-10-CM | POA: Diagnosis not present

## 2014-03-26 DIAGNOSIS — C9110 Chronic lymphocytic leukemia of B-cell type not having achieved remission: Secondary | ICD-10-CM | POA: Diagnosis not present

## 2014-03-26 DIAGNOSIS — B029 Zoster without complications: Secondary | ICD-10-CM | POA: Diagnosis not present

## 2014-03-28 ENCOUNTER — Ambulatory Visit: Payer: Self-pay | Admitting: Oncology

## 2014-03-28 DIAGNOSIS — F411 Generalized anxiety disorder: Secondary | ICD-10-CM | POA: Diagnosis not present

## 2014-03-28 DIAGNOSIS — R5383 Other fatigue: Secondary | ICD-10-CM | POA: Diagnosis not present

## 2014-03-28 DIAGNOSIS — B0229 Other postherpetic nervous system involvement: Secondary | ICD-10-CM | POA: Diagnosis not present

## 2014-03-28 DIAGNOSIS — Z803 Family history of malignant neoplasm of breast: Secondary | ICD-10-CM | POA: Diagnosis not present

## 2014-03-28 DIAGNOSIS — C9110 Chronic lymphocytic leukemia of B-cell type not having achieved remission: Secondary | ICD-10-CM | POA: Diagnosis not present

## 2014-03-28 DIAGNOSIS — R5381 Other malaise: Secondary | ICD-10-CM | POA: Diagnosis not present

## 2014-03-28 DIAGNOSIS — Z79899 Other long term (current) drug therapy: Secondary | ICD-10-CM | POA: Diagnosis not present

## 2014-03-28 DIAGNOSIS — Z85850 Personal history of malignant neoplasm of thyroid: Secondary | ICD-10-CM | POA: Diagnosis not present

## 2014-03-28 DIAGNOSIS — E0789 Other specified disorders of thyroid: Secondary | ICD-10-CM | POA: Diagnosis not present

## 2014-03-28 DIAGNOSIS — Z90710 Acquired absence of both cervix and uterus: Secondary | ICD-10-CM | POA: Diagnosis not present

## 2014-03-28 DIAGNOSIS — M2550 Pain in unspecified joint: Secondary | ICD-10-CM | POA: Diagnosis not present

## 2014-03-28 DIAGNOSIS — R071 Chest pain on breathing: Secondary | ICD-10-CM | POA: Diagnosis not present

## 2014-03-30 DIAGNOSIS — B029 Zoster without complications: Secondary | ICD-10-CM | POA: Diagnosis not present

## 2014-04-02 DIAGNOSIS — F4321 Adjustment disorder with depressed mood: Secondary | ICD-10-CM | POA: Diagnosis not present

## 2014-04-02 DIAGNOSIS — M129 Arthropathy, unspecified: Secondary | ICD-10-CM | POA: Diagnosis not present

## 2014-04-02 DIAGNOSIS — N644 Mastodynia: Secondary | ICD-10-CM | POA: Diagnosis not present

## 2014-04-02 DIAGNOSIS — B029 Zoster without complications: Secondary | ICD-10-CM | POA: Diagnosis not present

## 2014-04-05 DIAGNOSIS — B0229 Other postherpetic nervous system involvement: Secondary | ICD-10-CM | POA: Diagnosis not present

## 2014-04-13 DIAGNOSIS — E785 Hyperlipidemia, unspecified: Secondary | ICD-10-CM | POA: Diagnosis not present

## 2014-04-13 DIAGNOSIS — I10 Essential (primary) hypertension: Secondary | ICD-10-CM | POA: Diagnosis not present

## 2014-04-13 DIAGNOSIS — B029 Zoster without complications: Secondary | ICD-10-CM | POA: Diagnosis not present

## 2014-04-13 DIAGNOSIS — L03319 Cellulitis of trunk, unspecified: Secondary | ICD-10-CM | POA: Diagnosis not present

## 2014-04-13 DIAGNOSIS — C9110 Chronic lymphocytic leukemia of B-cell type not having achieved remission: Secondary | ICD-10-CM | POA: Diagnosis not present

## 2014-04-13 DIAGNOSIS — L02219 Cutaneous abscess of trunk, unspecified: Secondary | ICD-10-CM | POA: Diagnosis not present

## 2014-04-17 DIAGNOSIS — C9110 Chronic lymphocytic leukemia of B-cell type not having achieved remission: Secondary | ICD-10-CM | POA: Diagnosis not present

## 2014-04-17 DIAGNOSIS — B0229 Other postherpetic nervous system involvement: Secondary | ICD-10-CM | POA: Diagnosis not present

## 2014-04-17 LAB — COMPREHENSIVE METABOLIC PANEL WITH GFR
Albumin: 3.8 g/dL
Alkaline Phosphatase: 67 U/L
Anion Gap: 6 — ABNORMAL LOW
BUN: 12 mg/dL
Bilirubin,Total: 0.6 mg/dL
Calcium, Total: 9.3 mg/dL
Chloride: 101 mmol/L
Co2: 32 mmol/L
Creatinine: 0.73 mg/dL
EGFR (African American): >60
EGFR (Non-African Amer.): >60
Glucose: 89 mg/dL
Osmolality: 277
Potassium: 3.9 mmol/L
SGOT(AST): 13 U/L — ABNORMAL LOW
SGPT (ALT): 14 U/L
Sodium: 139 mmol/L
Total Protein: 7.3 g/dL

## 2014-04-17 LAB — CBC CANCER CENTER
Basophil #: 0.1 x10 3/mm (ref 0.0–0.1)
Basophil %: 1 %
Eosinophil #: 0.2 x10 3/mm (ref 0.0–0.7)
Eosinophil %: 1.5 %
HCT: 40.8 % (ref 35.0–47.0)
HGB: 13.2 g/dL (ref 12.0–16.0)
LYMPHS PCT: 57.4 %
Lymphocyte #: 6.2 x10 3/mm — ABNORMAL HIGH (ref 1.0–3.6)
MCH: 28.4 pg (ref 26.0–34.0)
MCHC: 32.3 g/dL (ref 32.0–36.0)
MCV: 88 fL (ref 80–100)
MONO ABS: 0.5 x10 3/mm (ref 0.2–0.9)
Monocyte %: 4.4 %
Neutrophil #: 3.9 x10 3/mm (ref 1.4–6.5)
Neutrophil %: 35.7 %
Platelet: 175 x10 3/mm (ref 150–440)
RBC: 4.65 10*6/uL (ref 3.80–5.20)
RDW: 15.2 % — ABNORMAL HIGH (ref 11.5–14.5)
WBC: 10.8 x10 3/mm (ref 3.6–11.0)

## 2014-04-20 DIAGNOSIS — L821 Other seborrheic keratosis: Secondary | ICD-10-CM | POA: Diagnosis not present

## 2014-04-20 DIAGNOSIS — D0430 Carcinoma in situ of skin of unspecified part of face: Secondary | ICD-10-CM | POA: Diagnosis not present

## 2014-04-20 DIAGNOSIS — D485 Neoplasm of uncertain behavior of skin: Secondary | ICD-10-CM | POA: Diagnosis not present

## 2014-04-20 DIAGNOSIS — D0439 Carcinoma in situ of skin of other parts of face: Secondary | ICD-10-CM | POA: Diagnosis not present

## 2014-04-27 ENCOUNTER — Ambulatory Visit: Payer: Self-pay | Admitting: Oncology

## 2014-05-14 DIAGNOSIS — Z1231 Encounter for screening mammogram for malignant neoplasm of breast: Secondary | ICD-10-CM | POA: Diagnosis not present

## 2014-05-24 DIAGNOSIS — D0439 Carcinoma in situ of skin of other parts of face: Secondary | ICD-10-CM | POA: Diagnosis not present

## 2014-05-24 DIAGNOSIS — D0430 Carcinoma in situ of skin of unspecified part of face: Secondary | ICD-10-CM | POA: Diagnosis not present

## 2014-05-24 DIAGNOSIS — C44320 Squamous cell carcinoma of skin of unspecified parts of face: Secondary | ICD-10-CM | POA: Diagnosis not present

## 2014-06-20 DIAGNOSIS — B029 Zoster without complications: Secondary | ICD-10-CM | POA: Diagnosis not present

## 2014-06-20 DIAGNOSIS — D485 Neoplasm of uncertain behavior of skin: Secondary | ICD-10-CM | POA: Diagnosis not present

## 2014-08-06 DIAGNOSIS — I10 Essential (primary) hypertension: Secondary | ICD-10-CM | POA: Diagnosis not present

## 2014-08-06 DIAGNOSIS — E785 Hyperlipidemia, unspecified: Secondary | ICD-10-CM | POA: Diagnosis not present

## 2014-08-06 DIAGNOSIS — I719 Aortic aneurysm of unspecified site, without rupture: Secondary | ICD-10-CM | POA: Diagnosis not present

## 2014-08-06 DIAGNOSIS — I7389 Other specified peripheral vascular diseases: Secondary | ICD-10-CM | POA: Diagnosis not present

## 2014-08-08 ENCOUNTER — Encounter: Payer: Self-pay | Admitting: Adult Health

## 2014-08-08 ENCOUNTER — Ambulatory Visit (INDEPENDENT_AMBULATORY_CARE_PROVIDER_SITE_OTHER): Payer: Medicare Other | Admitting: Adult Health

## 2014-08-08 VITALS — BP 126/83 | HR 66 | Temp 98.0°F | Resp 14 | Ht 61.5 in | Wt 153.5 lb

## 2014-08-08 DIAGNOSIS — Z23 Encounter for immunization: Secondary | ICD-10-CM | POA: Diagnosis not present

## 2014-08-08 NOTE — Progress Notes (Signed)
Patient ID: Lisa Figueroa, female   DOB: [birth year], 80 y.o.   MRN: [id number]    Subjective:    Patient ID: Lisa Figueroa, female    DOB: [birth year], 80 y.o.   MRN: [id number]  HPI  Pt is a pleasant 80 y/o female who presents to clinic to establish care. Previously followed by Dr. Moises Blood. Will request records. She also has a hx of thyroid cancer s/p thyroidectomy    Current Outpatient Prescriptions on File Prior to Visit  Medication Sig Dispense Refill  . Cholecalciferol (HM VITAMIN D3) 2000 UNITS CAPS Take 1 capsule by mouth daily.      Marland Kitchen levothyroxine (SYNTHROID, LEVOTHROID) 125 MCG tablet Take 1 tablet by mouth daily.      Marland Kitchen losartan (COZAAR) 100 MG tablet Take 1 tablet by mouth daily.      . simvastatin (ZOCOR) 20 MG tablet Take 1 tablet by mouth daily.       No current facility-administered medications on file prior to visit.     Review of Systems  Constitutional: Negative.   HENT: Negative.   Eyes: Negative.   Respiratory: Negative.   Cardiovascular: Negative.   Gastrointestinal: Negative.   Endocrine: Negative.   Genitourinary: Negative.   Musculoskeletal: Negative.   Skin: Negative.   Allergic/Immunologic: Negative.   Neurological: Negative.   Hematological: Negative.   Psychiatric/Behavioral: Negative.        Objective:  BP 126/83  Pulse 66  Temp(Src) 98 F (36.7 C) (Oral)  Resp 14  Ht 5' 1.5" (1.562 m)  Wt 153 lb 8 oz (69.627 kg)  BMI 28.54 kg/m2  SpO2 94%   Physical Exam  Constitutional: She is oriented to person, place, and time. No distress.  HENT:  Head: Normocephalic and atraumatic.  Eyes: Conjunctivae and EOM are normal.  Neck: Normal range of motion. Neck supple.  Cardiovascular: Normal rate, regular rhythm, normal heart sounds and intact distal pulses.  Exam reveals no gallop and no friction rub.   No murmur heard. Pulmonary/Chest: Effort normal and breath sounds normal. No respiratory distress. She has no wheezes. She has no rales.    Musculoskeletal: Normal range of motion.  Neurological: She is alert and oriented to person, place, and time. She has normal reflexes. Coordination normal.  Skin: Skin is warm and dry.  Psychiatric: She has a normal mood and affect. Her behavior is normal. Judgment and thought content normal.      Assessment & Plan:   1. Need for prophylactic vaccination against Streptococcus pneumoniae (pneumococcus) Prevnar given in clinic today.

## 2014-08-08 NOTE — Addendum Note (Signed)
Addended by: Wynonia Lawman E on: 08/08/2014 12:40 PM   Modules accepted: Orders

## 2014-08-08 NOTE — Patient Instructions (Signed)
   Thank you for choosing [place] [facility] for your health care needs.  We will request your records from Dr. Moises Blood and Dr. Oliva Bustard.  You received your pneumonia vaccine today.

## 2014-08-08 NOTE — Progress Notes (Signed)
Pre visit review using our clinic review tool, if applicable. No additional management support is needed unless otherwise documented below in the visit note. 

## 2014-08-10 ENCOUNTER — Ambulatory Visit: Payer: Self-pay | Admitting: Adult Health

## 2014-09-27 DIAGNOSIS — Z23 Encounter for immunization: Secondary | ICD-10-CM | POA: Diagnosis not present

## 2014-10-03 ENCOUNTER — Ambulatory Visit: Payer: Self-pay | Admitting: Oncology

## 2014-10-03 DIAGNOSIS — N858 Other specified noninflammatory disorders of uterus: Secondary | ICD-10-CM | POA: Diagnosis not present

## 2014-10-03 DIAGNOSIS — C8590 Non-Hodgkin lymphoma, unspecified, unspecified site: Secondary | ICD-10-CM | POA: Diagnosis not present

## 2014-10-03 DIAGNOSIS — R948 Abnormal results of function studies of other organs and systems: Secondary | ICD-10-CM | POA: Diagnosis not present

## 2014-10-03 DIAGNOSIS — C9110 Chronic lymphocytic leukemia of B-cell type not having achieved remission: Secondary | ICD-10-CM | POA: Diagnosis not present

## 2014-10-03 DIAGNOSIS — R599 Enlarged lymph nodes, unspecified: Secondary | ICD-10-CM | POA: Diagnosis not present

## 2014-10-03 DIAGNOSIS — R590 Localized enlarged lymph nodes: Secondary | ICD-10-CM | POA: Diagnosis not present

## 2014-10-09 ENCOUNTER — Ambulatory Visit: Payer: Self-pay | Admitting: Oncology

## 2014-10-09 DIAGNOSIS — Z803 Family history of malignant neoplasm of breast: Secondary | ICD-10-CM | POA: Diagnosis not present

## 2014-10-09 DIAGNOSIS — Z79899 Other long term (current) drug therapy: Secondary | ICD-10-CM | POA: Diagnosis not present

## 2014-10-09 DIAGNOSIS — Z90710 Acquired absence of both cervix and uterus: Secondary | ICD-10-CM | POA: Diagnosis not present

## 2014-10-09 DIAGNOSIS — C9110 Chronic lymphocytic leukemia of B-cell type not having achieved remission: Secondary | ICD-10-CM | POA: Diagnosis not present

## 2014-10-09 DIAGNOSIS — N289 Disorder of kidney and ureter, unspecified: Secondary | ICD-10-CM | POA: Diagnosis not present

## 2014-10-09 DIAGNOSIS — R531 Weakness: Secondary | ICD-10-CM | POA: Diagnosis not present

## 2014-10-09 DIAGNOSIS — F419 Anxiety disorder, unspecified: Secondary | ICD-10-CM | POA: Diagnosis not present

## 2014-10-09 DIAGNOSIS — I7090 Unspecified atherosclerosis: Secondary | ICD-10-CM | POA: Diagnosis not present

## 2014-10-09 DIAGNOSIS — Z9089 Acquired absence of other organs: Secondary | ICD-10-CM | POA: Diagnosis not present

## 2014-10-09 DIAGNOSIS — Z85850 Personal history of malignant neoplasm of thyroid: Secondary | ICD-10-CM | POA: Diagnosis not present

## 2014-10-09 DIAGNOSIS — B0229 Other postherpetic nervous system involvement: Secondary | ICD-10-CM | POA: Diagnosis not present

## 2014-10-09 DIAGNOSIS — D1771 Benign lipomatous neoplasm of kidney: Secondary | ICD-10-CM | POA: Diagnosis not present

## 2014-10-09 DIAGNOSIS — I77819 Aortic ectasia, unspecified site: Secondary | ICD-10-CM | POA: Diagnosis not present

## 2014-10-09 DIAGNOSIS — R599 Enlarged lymph nodes, unspecified: Secondary | ICD-10-CM | POA: Diagnosis not present

## 2014-10-09 DIAGNOSIS — N644 Mastodynia: Secondary | ICD-10-CM | POA: Diagnosis not present

## 2014-10-09 DIAGNOSIS — B029 Zoster without complications: Secondary | ICD-10-CM | POA: Diagnosis not present

## 2014-10-09 LAB — COMPREHENSIVE METABOLIC PANEL
ALBUMIN: 3.8 g/dL (ref 3.4–5.0)
ALT: 19 U/L
Alkaline Phosphatase: 65 U/L
Anion Gap: 6 — ABNORMAL LOW (ref 7–16)
BILIRUBIN TOTAL: 0.3 mg/dL (ref 0.2–1.0)
BUN: 14 mg/dL (ref 7–18)
CHLORIDE: 100 mmol/L (ref 98–107)
Calcium, Total: 8.7 mg/dL (ref 8.5–10.1)
Co2: 34 mmol/L — ABNORMAL HIGH (ref 21–32)
Creatinine: 0.8 mg/dL (ref 0.60–1.30)
EGFR (African American): >60
Glucose: 90 mg/dL (ref 65–99)
Osmolality: 279 (ref 275–301)
Potassium: 4.4 mmol/L (ref 3.5–5.1)
SGOT(AST): 20 U/L (ref 15–37)
SODIUM: 140 mmol/L (ref 136–145)
TOTAL PROTEIN: 7.5 g/dL (ref 6.4–8.2)

## 2014-10-09 LAB — CBC CANCER CENTER
BASOS ABS: 0.2 x10 3/mm — AB (ref 0.0–0.1)
BASOS PCT: 0.9 %
EOS PCT: 1 %
Eosinophil #: 0.2 x10 3/mm (ref 0.0–0.7)
HCT: 43.7 % (ref 35.0–47.0)
HGB: 14.2 g/dL (ref 12.0–16.0)
LYMPHS ABS: 12.8 x10 3/mm — AB (ref 1.0–3.6)
Lymphocyte %: 68.7 %
MCH: 28.4 pg (ref 26.0–34.0)
MCHC: 32.4 g/dL (ref 32.0–36.0)
MCV: 88 fL (ref 80–100)
Monocyte #: 0.7 x10 3/mm (ref 0.2–0.9)
Monocyte %: 3.9 %
NEUTROS PCT: 25.5 %
Neutrophil #: 4.7 x10 3/mm (ref 1.4–6.5)
PLATELETS: 204 x10 3/mm (ref 150–440)
RBC: 4.99 10*6/uL (ref 3.80–5.20)
RDW: 14 % (ref 11.5–14.5)
WBC: 18.6 x10 3/mm — AB (ref 3.6–11.0)

## 2014-10-09 LAB — LACTATE DEHYDROGENASE: LDH: 188 U/L (ref 81–246)

## 2014-10-28 ENCOUNTER — Ambulatory Visit: Payer: Self-pay | Admitting: Oncology

## 2014-10-29 ENCOUNTER — Encounter: Payer: Self-pay | Admitting: Adult Health

## 2014-11-15 ENCOUNTER — Ambulatory Visit (INDEPENDENT_AMBULATORY_CARE_PROVIDER_SITE_OTHER): Payer: Medicare Other | Admitting: Internal Medicine

## 2014-11-15 ENCOUNTER — Encounter: Payer: Self-pay | Admitting: Internal Medicine

## 2014-11-15 VITALS — BP 148/86 | HR 61 | Temp 97.6°F | Resp 16 | Ht 61.5 in | Wt 160.0 lb

## 2014-11-15 DIAGNOSIS — E559 Vitamin D deficiency, unspecified: Secondary | ICD-10-CM

## 2014-11-15 DIAGNOSIS — I714 Abdominal aortic aneurysm, without rupture, unspecified: Secondary | ICD-10-CM

## 2014-11-15 DIAGNOSIS — C9110 Chronic lymphocytic leukemia of B-cell type not having achieved remission: Secondary | ICD-10-CM

## 2014-11-15 DIAGNOSIS — I25810 Atherosclerosis of coronary artery bypass graft(s) without angina pectoris: Secondary | ICD-10-CM

## 2014-11-15 DIAGNOSIS — E890 Postprocedural hypothyroidism: Secondary | ICD-10-CM

## 2014-11-15 DIAGNOSIS — B0222 Postherpetic trigeminal neuralgia: Secondary | ICD-10-CM

## 2014-11-15 DIAGNOSIS — E785 Hyperlipidemia, unspecified: Secondary | ICD-10-CM | POA: Diagnosis not present

## 2014-11-15 DIAGNOSIS — M810 Age-related osteoporosis without current pathological fracture: Secondary | ICD-10-CM

## 2014-11-15 DIAGNOSIS — Z85850 Personal history of malignant neoplasm of thyroid: Secondary | ICD-10-CM | POA: Diagnosis not present

## 2014-11-15 DIAGNOSIS — I10 Essential (primary) hypertension: Secondary | ICD-10-CM

## 2014-11-15 DIAGNOSIS — Z9889 Other specified postprocedural states: Secondary | ICD-10-CM

## 2014-11-15 LAB — COMPREHENSIVE METABOLIC PANEL
ALK PHOS: 50 U/L (ref 39–117)
ALT: 12 U/L (ref 0–35)
AST: 18 U/L (ref 0–37)
Albumin: 4.3 g/dL (ref 3.5–5.2)
BUN: 16 mg/dL (ref 6–23)
CALCIUM: 9.2 mg/dL (ref 8.4–10.5)
CO2: 30 mEq/L (ref 19–32)
CREATININE: 0.7 mg/dL (ref 0.4–1.2)
Chloride: 98 mEq/L (ref 96–112)
GFR: 81.49 mL/min (ref >60.00)
Glucose, Bld: 82 mg/dL (ref 70–99)
Potassium: 4.9 mEq/L (ref 3.5–5.1)
Sodium: 135 mEq/L (ref 135–145)
Total Bilirubin: 0.7 mg/dL (ref 0.2–1.2)
Total Protein: 7.4 g/dL (ref 6.0–8.3)

## 2014-11-15 LAB — CBC WITH DIFFERENTIAL/PLATELET
BASOS PCT: 0.2 % (ref 0.0–3.0)
Basophils Absolute: 0 10*3/uL (ref 0.0–0.1)
EOS ABS: 0.2 10*3/uL (ref 0.0–0.7)
Eosinophils Relative: 1 % (ref 0.0–5.0)
HEMATOCRIT: 44.3 % (ref 36.0–46.0)
HEMOGLOBIN: 14.5 g/dL (ref 12.0–15.0)
Lymphocytes Relative: 72 % — ABNORMAL HIGH (ref 12.0–46.0)
Lymphs Abs: 11.5 10*3/uL — ABNORMAL HIGH (ref 0.7–4.0)
MCHC: 32.7 g/dL (ref 30.0–36.0)
MCV: 87.3 fl (ref 78.0–100.0)
MONO ABS: 0.4 10*3/uL (ref 0.1–1.0)
Monocytes Relative: 2.6 % — ABNORMAL LOW (ref 3.0–12.0)
NEUTROS ABS: 3.9 10*3/uL (ref 1.4–7.7)
NEUTROS PCT: 24.2 % — AB (ref 43.0–77.0)
Platelets: 194 10*3/uL (ref 150.0–400.0)
RBC: 5.08 Mil/uL (ref 3.87–5.11)
RDW: 14.1 % (ref 11.5–15.5)
WBC: 15.9 10*3/uL — AB (ref 4.0–10.5)

## 2014-11-15 LAB — LIPID PANEL
CHOLESTEROL: 167 mg/dL (ref 0–200)
HDL: 55.2 mg/dL (ref >39.00)
LDL CALC: 86 mg/dL (ref 0–99)
NonHDL: 111.8
Total CHOL/HDL Ratio: 3
Triglycerides: 130 mg/dL (ref 0.0–149.0)
VLDL: 26 mg/dL (ref 0.0–40.0)

## 2014-11-15 LAB — TSH: TSH: 3.43 u[IU]/mL (ref 0.35–4.50)

## 2014-11-15 LAB — VITAMIN D 25 HYDROXY (VIT D DEFICIENCY, FRACTURES): VITD: 37.03 ng/mL (ref 30.00–100.00)

## 2014-11-15 MED ORDER — TRAMADOL HCL 50 MG PO TABS: 50.0000 mg | ORAL_TABLET | Freq: Two times a day (BID) | ORAL | Status: DC

## 2014-11-15 NOTE — Progress Notes (Signed)
Pre-visit discussion using our clinic review tool. No additional management support is needed unless otherwise documented below in the visit note.  

## 2014-11-15 NOTE — Assessment & Plan Note (Addendum)
Per patient Lisa Figueroa, was found by  Dr Fletcher Anon during prior workup for heat disease with  heart catheterization, now followed by Dr Doroteo Bradford .  Requesting to be referred  back to Dr Fletcher Anon mention of anuerys,  Imaging stuf There are no available records except what i can review on sunrise,  no aortic ultrasound or contrasted abd Ct,  And no cardiac catheterization  Is available for review.  Will order ultrasound.  Marland Kitchen

## 2014-11-15 NOTE — Patient Instructions (Signed)
It was very nice meeting  You  I will refill you medicaitons once I see your labs  You will follow up with Lorane Gell in 6 months for your Medicare  wellness exam   Referral to Dr Fletcher Anon is in process to follow up on your aneurysm

## 2014-11-15 NOTE — Progress Notes (Signed)
Patient ID: MACKENZIE GROOM, female   DOB: Apr 17, 1932, 80 y.o.   MRN: [id number]   Patient Active Problem List   Diagnosis Date Noted  . Osteoporosis 11/18/2014  . Post-herpetic trigeminal neuralgia 11/15/2014  . Abdominal aortic aneurysm 11/15/2014  . Need for prophylactic vaccination against Streptococcus pneumoniae (pneumococcus) 08/08/2014  . Axillary lump 06/01/2013  . Abnormal mammogram 05/18/2013    Subjective:  CC:   Chief Complaint  Patient presents with  . Follow-up    from new patient visit with Jairo Ben NP.    HPI:   EMALY BOSCHERT is a 80 y.o. female who presents for follow up on multiple issues including post herpetic neuralgia, iatrogenic hypothyroidism secondary to thyroidectomy, and CLL.   She has been having persistent shingles pain since January thoracic area on the left  Complicated by cellulitis requiring hospitalization for  5 days at [facility]    History of thyroid cancer diagnosed in 1998, and in  2014  Had  axillary LN positive for CLL  removed by [facility]  .      Managed by [facility].  Annual PET scans have been done but are cost prohibitive.  CBC was normal in April    Past Medical History  Diagnosis Date  . Thyroid cancer 1998  . History of colon polyps   . History of shingles   . Post herpetic neuralgia     Past Surgical History  Procedure Laterality Date  . Cholecystectomy    . Abdominal hysterectomy    . Hemorrhoid surgery    . Total thyroidectomy  1998  . Appendectomy    . Breast surgery Right     cyst excision       The following portions of the patient's history were reviewed and updated as appropriate: Allergies, current medications, and problem list.    Review of Systems:   Patient denies headache, fevers, malaise, unintentional weight loss, skin rash, eye pain, sinus congestion and sinus pain, sore throat, dysphagia,  hemoptysis , cough, dyspnea, wheezing, chest pain, palpitations, orthopnea, edema, abdominal pain, nausea,  melena, diarrhea, constipation, flank pain, dysuria, hematuria, urinary  Frequency, nocturia, numbness, tingling, seizures,  Focal weakness, Loss of consciousness,  Tremor, insomnia, depression, anxiety, and suicidal ideation.     History   Social History  . Marital Status: Married    Spouse Name: N/A    Number of Children: N/A  . Years of Education: N/A   Occupational History  . Not on file.   Social History Main Topics  . Smoking status: Former Smoker -- 1.00 packs/day for 20 years    Quit date: 08/08/1984  . Smokeless tobacco: Not on file  . Alcohol Use: Yes     Comment: wine glass daily  . Drug Use: No  . Sexual Activity: Not on file   Other Topics Concern  . Not on file   Social History Narrative    Objective:  Filed Vitals:   11/15/14 0910  BP: 148/86  Pulse: 61  Temp: 97.6 F (36.4 C)  Resp: 16     General appearance: alert, cooperative and appears stated age Ears: normal TM's and external ear canals both ears Throat: lips, mucosa, and tongue normal; teeth and gums normal Neck: no adenopathy, no carotid bruit, supple, symmetrical, trachea midline and thyroid not enlarged, symmetric, no tenderness/mass/nodules Back: symmetric, no curvature. ROM normal. No CVA tenderness. Lungs: clear to auscultation bilaterally Heart: regular rate and rhythm, S1, S2 normal, no murmur, click, rub or gallop Abdomen:  soft, non-tender; bowel sounds normal; no masses,  no organomegaly Pulses: 2+ and symmetric Skin: Skin color, texture, turgor normal. No rashes or lesions Lymph nodes: Cervical, supraclavicular, and axillary nodes normal.  Assessment and Plan:  Abdominal aortic aneurysm Per patient AA, was found by  Dr Fletcher Anon during prior workup for heat disease with  heart catheterization, now followed by Dr Doroteo Bradford .  Requesting to be referred  back to Dr Fletcher Anon mention of anuerys,  Imaging stuf There are no available records except what i can review on sunrise,  no aortic  ultrasound or contrasted abd Ct,  And no cardiac catheterization  Is available for review.  Will order ultrasound.  .    Post-herpetic trigeminal neuralgia Managed with gabapentin and prn tramadol.   S/P thyroidectomy secondary to thyroid CA 1998 . TSH is not suppressed,  And synthroid dose will be increased.   Lab Results  Component Value Date   TSH 3.43 11/15/2014     Postsurgical hypothyroidism I have increased her levothyroxine because her TSH is not suppressed,  Which should be the goal in patients with history of thyroid cancer s/p thyroidectomy . Repeat TSH in 8 week4  Lab Results  Component Value Date   TSH 3.43 11/15/2014     Osteoporosis Review of old records indicate prior pelvic fractures with no clear history of therapy.  Given her age and concurrent history of thyoird CA and CLL.  It is not clear if bisphosphonates or denosumab are safe to use . Would  discuss with Dr Howell Rucks and Dr Oliva Bustard, and if life expectancy is 10 years and  Treatment not contraindicated , would get DEXA scan .   CLL (chronic lymphocytic leukemia) According to patient her CBC was normal in April.  She has mild leukocytosis currently. I will repeat in 8 weeks with her TSH  Lab Results  Component Value Date   WBC 15.9* 11/15/2014   HGB 14.5 11/15/2014   HCT 44.3 11/15/2014   MCV 87.3 11/15/2014   PLT 194.0 11/15/2014    A total of 40 minutes was spent with patient more than half of which was spent in counseling patient on the above mentioned issues , reviewing and explaining recent labs and imaging studies done, and coordination of care.   Updated Medication List Outpatient Encounter Prescriptions as of 11/15/2014  Medication Sig  . acetaminophen (TYLENOL) 500 MG tablet Take 500 mg by mouth daily.  . Cholecalciferol (HM VITAMIN D3) 2000 UNITS CAPS Take 1 capsule by mouth daily.  Marland Kitchen docusate sodium (COLACE) 100 MG capsule Take 100 mg by mouth 2 (two) times daily.  Marland Kitchen gabapentin (NEURONTIN)  300 MG capsule Take 300 mg by mouth 2 (two) times daily.   Marland Kitchen levothyroxine (SYNTHROID, LEVOTHROID) 137 MCG tablet Take 1 tablet (137 mcg total) by mouth daily.  Marland Kitchen losartan (COZAAR) 100 MG tablet Take 1 tablet by mouth daily.  . simvastatin (ZOCOR) 20 MG tablet Take 1 tablet by mouth daily.  . traMADol (ULTRAM) 50 MG tablet Take 1 tablet (50 mg total) by mouth 2 (two) times daily.  . [DISCONTINUED] levothyroxine (SYNTHROID, LEVOTHROID) 125 MCG tablet Take 1 tablet by mouth daily.  . [DISCONTINUED] traMADol (ULTRAM) 50 MG tablet Take by mouth every 6 (six) hours as needed.

## 2014-11-16 ENCOUNTER — Telehealth: Payer: Self-pay | Admitting: Family Medicine

## 2014-11-16 NOTE — Telephone Encounter (Signed)
emmi mailed  °

## 2014-11-18 DIAGNOSIS — E890 Postprocedural hypothyroidism: Secondary | ICD-10-CM | POA: Insufficient documentation

## 2014-11-18 DIAGNOSIS — Z9889 Other specified postprocedural states: Secondary | ICD-10-CM | POA: Insufficient documentation

## 2014-11-18 DIAGNOSIS — M810 Age-related osteoporosis without current pathological fracture: Secondary | ICD-10-CM | POA: Insufficient documentation

## 2014-11-18 DIAGNOSIS — C9110 Chronic lymphocytic leukemia of B-cell type not having achieved remission: Secondary | ICD-10-CM | POA: Insufficient documentation

## 2014-11-18 MED ORDER — LEVOTHYROXINE SODIUM 137 MCG PO TABS: 125.0000 ug | ORAL_TABLET | Freq: Every day | ORAL | Status: DC

## 2014-11-18 NOTE — Assessment & Plan Note (Signed)
secondary to thyroid CA 1998 . TSH is not suppressed,  And synthroid dose will be increased.   Lab Results  Component Value Date   TSH 3.43 11/15/2014

## 2014-11-18 NOTE — Assessment & Plan Note (Signed)
Managed with gabapentin and prn tramadol.

## 2014-11-18 NOTE — Assessment & Plan Note (Signed)
Review of old records indicate prior pelvic fractures with no clear history of therapy.  Given her age and concurrent history of thyoird CA and CLL.  It is not clear if bisphosphonates or denosumab are safe to use . Would  discuss with Dr Howell Rucks and Dr Oliva Bustard, and if life expectancy is 10 years and  Treatment not contraindicated , would get DEXA scan .

## 2014-11-18 NOTE — Assessment & Plan Note (Addendum)
I have increased her levothyroxine because her TSH is not suppressed,  Which should be the goal in patients with history of thyroid cancer s/p thyroidectomy . Repeat TSH in 8 week4  Lab Results  Component Value Date   TSH 3.43 11/15/2014

## 2014-11-18 NOTE — Assessment & Plan Note (Signed)
According to patient her CBC was normal in April.  She has mild leukocytosis currently. I will repeat in 8 weeks with her TSH  Lab Results  Component Value Date   WBC 15.9* 11/15/2014   HGB 14.5 11/15/2014   HCT 44.3 11/15/2014   MCV 87.3 11/15/2014   PLT 194.0 11/15/2014

## 2014-11-19 ENCOUNTER — Encounter: Payer: Self-pay | Admitting: Cardiovascular Disease

## 2014-11-19 ENCOUNTER — Other Ambulatory Visit: Payer: Self-pay | Admitting: *Deleted

## 2014-11-19 ENCOUNTER — Ambulatory Visit (INDEPENDENT_AMBULATORY_CARE_PROVIDER_SITE_OTHER): Payer: PRIVATE HEALTH INSURANCE | Admitting: Cardiovascular Disease

## 2014-11-19 VITALS — BP 138/90 | HR 68 | Ht 62.0 in | Wt 161.2 lb

## 2014-11-19 DIAGNOSIS — I714 Abdominal aortic aneurysm, without rupture, unspecified: Secondary | ICD-10-CM

## 2014-11-19 DIAGNOSIS — E785 Hyperlipidemia, unspecified: Secondary | ICD-10-CM

## 2014-11-19 DIAGNOSIS — I10 Essential (primary) hypertension: Secondary | ICD-10-CM | POA: Diagnosis not present

## 2014-11-19 MED ORDER — GABAPENTIN 300 MG PO CAPS: 300.0000 mg | ORAL_CAPSULE | Freq: Two times a day (BID) | ORAL | Status: DC

## 2014-11-19 MED ORDER — SIMVASTATIN 20 MG PO TABS: 20.0000 mg | ORAL_TABLET | Freq: Every day | ORAL | Status: DC

## 2014-11-19 MED ORDER — LOSARTAN POTASSIUM 100 MG PO TABS: 100.0000 mg | ORAL_TABLET | Freq: Every day | ORAL | Status: DC

## 2014-11-19 NOTE — Assessment & Plan Note (Signed)
Lab Results  Component Value Date   CHOL 167 11/15/2014   HDL 55.20 11/15/2014   LDLCALC 86 11/15/2014   TRIG 130.0 11/15/2014   CHOLHDL 3 11/15/2014   She is currently on simvastatin 20 mg once daily with acceptable lipid profile.

## 2014-11-19 NOTE — Telephone Encounter (Signed)
Spoke to pt, needing refills. Rx sent to pharmacy by escript

## 2014-11-19 NOTE — Assessment & Plan Note (Signed)
She is asymptomatic. This has not been checked in over a year. I requested a abdominal aortic ultrasound for evaluation. Previous records were also requested.

## 2014-11-19 NOTE — Progress Notes (Signed)
Primary care physician: Dr. Derrel Nip  HPI  Lisa Figueroa is a 80 y.o. female who was referred to establish cardiovascular care regarding abdominal aortic aneurysm. She has chronic medical conditions that include post herpetic neuralgia, iatrogenic hypothyroidism secondary to thyroidectomy, and CLL.  History of thyroid cancer diagnosed in 1998, and in 2014 Had axillary LN positive for CLL removed by [facility] .  She was seen by me in the past at [facility]. The records are not available and these were requested. From her description, I performed cardiac catheterization which showed nonobstructive coronary artery disease. However, she was found to have an abdominal aortic aneurysm. This has not been checked in over one year. She denies any chest pain, shortness of breath or abdominal pain. She is not a smoker and has no family history of coronary artery disease or aneurysms.  Allergies  Allergen Reactions  . Prednisone     crys and can't sleep     Current Outpatient Prescriptions on File Prior to Visit  Medication Sig Dispense Refill  . acetaminophen (TYLENOL) 500 MG tablet Take 500 mg by mouth daily.    . Cholecalciferol (HM VITAMIN D3) 2000 UNITS CAPS Take 1 capsule by mouth daily.    Marland Kitchen docusate sodium (COLACE) 100 MG capsule Take 100 mg by mouth 2 (two) times daily.    Marland Kitchen levothyroxine (SYNTHROID, LEVOTHROID) 137 MCG tablet Take 1 tablet (137 mcg total) by mouth daily. 90 tablet 1  . traMADol (ULTRAM) 50 MG tablet Take 1 tablet (50 mg total) by mouth 2 (two) times daily. 90 tablet 1   No current facility-administered medications on file prior to visit.     Past Medical History  Diagnosis Date  . Thyroid cancer 1998  . History of colon polyps   . History of shingles   . Post herpetic neuralgia   . AAA (abdominal aortic aneurysm)   . Hypertension   . Hyperlipidemia   . Thyroid cancer      Past Surgical History  Procedure Laterality Date  .  Cholecystectomy    . Abdominal hysterectomy    . Hemorrhoid surgery    . Total thyroidectomy  1998  . Appendectomy    . Breast surgery Right     cyst excision  . Cardiac catheterization       Family History  Problem Relation Age of Onset  . Cancer Brother     prostate and bone  . Cancer Maternal Aunt     breast  . Cancer Cousin     breast - maternal side  . Heart disease Mother   . Heart disease Father      History   Social History  . Marital Status: Married    Spouse Name: N/A    Number of Children: N/A  . Years of Education: N/A   Occupational History  . Not on file.   Social History Main Topics  . Smoking status: Former Smoker -- 1.00 packs/day for 20 years    Quit date: 08/08/1984  . Smokeless tobacco: Not on file  . Alcohol Use: Yes     Comment: wine glass daily  . Drug Use: No  . Sexual Activity: Not on file   Other Topics Concern  . Not on file   Social History Narrative     ROS A 10 point review of system was performed. It is negative other than that mentioned in the history of present illness.   PHYSICAL EXAM   BP 138/90 mmHg  Pulse 68  Ht 5\' 2"  (1.575 m)  Wt 161 lb 4 oz (73.143 kg)  BMI 29.49 kg/m2 Constitutional: She is oriented to person, place, and time. She appears well-developed and well-nourished. No distress.  HENT: No nasal discharge.  Head: Normocephalic and atraumatic.  Eyes: Pupils are equal and round. No discharge.  Neck: Normal range of motion. Neck supple. No JVD present. No thyromegaly present.  Cardiovascular: Normal rate, regular rhythm, normal heart sounds. Exam reveals no gallop and no friction rub. No murmur heard.  Pulmonary/Chest: Effort normal and breath sounds normal. No stridor. No respiratory distress. She has no wheezes. She has no rales. She exhibits no tenderness.  Abdominal: Soft. Bowel sounds are normal. She exhibits no distension. There is no tenderness. There is no rebound and no guarding.    Musculoskeletal: Normal range of motion. She exhibits no edema and no tenderness.  Neurological: She is alert and oriented to person, place, and time. Coordination normal.  Skin: Skin is warm and dry. No rash noted. She is not diaphoretic. No erythema. No pallor.  Psychiatric: She has a normal mood and affect. Her behavior is normal. Judgment and thought content normal.     XUX:YBFXO  Rhythm   -RSR(V1) -nondiagnostic.   BORDERLINE RHYTHM   ASSESSMENT AND PLAN

## 2014-11-19 NOTE — Patient Instructions (Signed)
Your physician has requested that you have an abdominal aorta duplex. During this test, an ultrasound is used to evaluate the aorta. Allow 30 minutes for this exam. Do not eat after midnight the day before and avoid carbonated beverages  Your physician wants you to follow-up in: 6 months with Dr. Fletcher Anon. You will receive a reminder letter in the mail two months in advance. If you don't receive a letter, please call our office to schedule the follow-up appointment.

## 2014-11-19 NOTE — Assessment & Plan Note (Signed)
Blood pressure is reasonably controlled on losartan.

## 2014-11-28 ENCOUNTER — Telehealth: Payer: Self-pay | Admitting: Certified Registered Nurse Anesthetist

## 2014-11-28 ENCOUNTER — Telehealth: Payer: Self-pay | Admitting: *Deleted

## 2014-11-28 NOTE — Telephone Encounter (Signed)
Pt had called 11/27/14 and spoke to CAN for elevated BP 145/89, HR 60. She was advised to continue to check it and call with elevated readings. Followed up with pt to see if she needed appt, declined. Will follow advice given. No acute symptoms.

## 2014-11-28 NOTE — Telephone Encounter (Signed)
See other telephone note, spoke to patient.

## 2014-11-28 NOTE — Telephone Encounter (Signed)
Pt will continue to check and call us if BP is above 140/90.

## 2014-11-28 NOTE — Telephone Encounter (Signed)
PLEASE NOTE: All timestamps contained within this report are represented as [place] Standard Time. CONFIDENTIALTY NOTICE: This fax transmission is intended only for the addressee. It contains information that is legally privileged, confidential or otherwise protected from use or disclosure. If you are not the intended recipient, you are strictly prohibited from reviewing, disclosing, copying using or disseminating any of this information or taking any action in reliance on or regarding this information. If you have received this fax in error, please notify us immediately by telephone so that we can arrange for its return to [place]. Phone: [phone], Toll-Free: [phone], Fax: [phone] Page: 1 of 2 Call Id: [id number] [place] Patient Name: Lisa Figueroa Gender: Female DOB: Sep 11, 1932 Age: 80 Y 2 M 18 D Return Phone Number: [phone] (Primary) Address: [address] City/State/Zip: [place] [address] Client [facility] Client Site [facility], [place] Type Call Call Type Triage / Clinical Relationship To Patient Self Return Phone Number [phone] (Primary) Chief Complaint Blood Pressure High Initial Comment Caller states her BP has been running high for a few days. 145/89 PreDisposition Home Care Nurse Assessment Nurse: Markus Daft, RN, Sherre Poot Date/Time Eilene Ghazi Time): 11/27/2014 8:46:02 AM Confirm and document reason for call. If symptomatic, describe symptoms. ---Caller states her BP has been running high for a few days. BP 145/89, 60's bpm this AM. Denies s/s. Changed thyroid pills one wk. ago increasing the dose as it was underactive. Has the patient traveled out of the country within the last 30 days? ---Not Applicable Does the patient require triage? ---Yes Related visit to  physician within the last 2 weeks? ---No Does the PT have any chronic conditions? (i.e. diabetes, asthma, etc.) ---Yes List chronic conditions. ---hypothyroid; HTN; high cholesterol; Gabapentin; tramadol - shingles Guidelines Guideline Title Affirmed Question Affirmed Notes Nurse Date/Time (Eastern Time) High Blood Pressure [1] BP # 140/90 AND [2] taking BP medications last seen by MD at this practice a week ago Markus Daft, [place], [facility] 11/27/2014 8:48:25 AM Disp. Time Eilene Ghazi Time) Disposition Final User 11/27/2014 8:54:50 AM See PCP within 2 Weeks Yes Markus Daft, RN, Kenton Kingfisher Understands: Yes Disagree/Comply: Comply PLEASE NOTE: All timestamps contained within this report are represented as [place] Standard Time. CONFIDENTIALTY NOTICE: This fax transmission is intended only for the addressee. It contains information that is legally privileged, confidential or otherwise protected from use or disclosure. If you are not the intended recipient, you are strictly prohibited from reviewing, disclosing, copying using or disseminating any of this information or taking any action in reliance on or regarding this information. If you have received this fax in error, please notify us immediately by telephone so that we can arrange for its return to [place]. Phone: [phone], Toll-Free: [phone], Fax: [phone] Page: 2 of 2 Call Id: [id number] Care Advice Given Per Guideline SEE PCP WITHIN 2 WEEKS: You need an evaluation for this ongoing problem within the next 2 weeks. Call your doctor during regular office hours and make an appointment. REASSURANCE: * Your blood pressure is elevated but you have told me that you are not having any symptoms. * You should see your doctor and have your blood pressure checked within 2 weeks. * You might need to have an adjustment in your medication(s). HYPERTENSION MEDICATIONS: * Untreated hypertension may cause damage to the heart, brain, kidneys, and eyes. * It is  important to take prescribed  medications as directed. * The goal of blood pressure treatment for most patients with hypertension is to keep the blood pressure under 140/90. CONT. KEEP OF DIARY OF BP's and time of day and how she feels. LIFESTYLE MODIFICATIONS - The following things can help you reduce your blood pressure: * EAT HEALTHY: Eat a diet rich in fresh fruits and vegetables, dietary fiber, non-animal protein (e.g., soy), and low-fat dairy products. Avoid foods with a high content of saturated fat or cholesterol. * DECREASE SODIUM INTAKE: Aim to eat less than 1,500 mg of sodium each day. Unfortunately 75% of the salt in the average person's diet is in pre-processed foods. * EXERCISE, BE MORE PHYSICALLY ACTIVE: Do 30-60 minutes of moderate intensity exercise four to seven times a week. Examples include aerobic activities like brisk walking, cycling, and swimming. * REDUCE STRESS: Find activities that help reduce your stress. Examples might include meditation, yoga, or even a restful walk in a park. CALL BACK IF: * Weakness or numbness of the face, arm or leg on one side of the body occurs * Difficulty walking, difficulty talking, or severe headache occurs * Chest pain or difficulty breathing occurs * Your blood pressure is over 160/100 * You become worse. CARE ADVICE given per High Blood Pressure (Adult) guideline. Comments User: Mayford Knife, RN Date/Time Eilene Ghazi Time): 11/27/2014 8:53:34 AM Caller advised to call office back for appt sooner - within 2 wks.

## 2014-11-29 ENCOUNTER — Encounter: Payer: Self-pay | Admitting: Cardiovascular Disease

## 2014-12-04 DIAGNOSIS — Z1283 Encounter for screening for malignant neoplasm of skin: Secondary | ICD-10-CM | POA: Diagnosis not present

## 2014-12-04 DIAGNOSIS — D485 Neoplasm of uncertain behavior of skin: Secondary | ICD-10-CM | POA: Diagnosis not present

## 2014-12-04 DIAGNOSIS — D225 Melanocytic nevi of trunk: Secondary | ICD-10-CM | POA: Diagnosis not present

## 2014-12-04 DIAGNOSIS — Z872 Personal history of diseases of the skin and subcutaneous tissue: Secondary | ICD-10-CM | POA: Diagnosis not present

## 2014-12-04 DIAGNOSIS — L570 Actinic keratosis: Secondary | ICD-10-CM | POA: Diagnosis not present

## 2014-12-04 DIAGNOSIS — Z08 Encounter for follow-up examination after completed treatment for malignant neoplasm: Secondary | ICD-10-CM | POA: Diagnosis not present

## 2014-12-04 DIAGNOSIS — Z85828 Personal history of other malignant neoplasm of skin: Secondary | ICD-10-CM | POA: Diagnosis not present

## 2014-12-04 DIAGNOSIS — L728 Other follicular cysts of the skin and subcutaneous tissue: Secondary | ICD-10-CM | POA: Diagnosis not present

## 2014-12-10 DIAGNOSIS — H2513 Age-related nuclear cataract, bilateral: Secondary | ICD-10-CM | POA: Diagnosis not present

## 2014-12-18 ENCOUNTER — Ambulatory Visit (INDEPENDENT_AMBULATORY_CARE_PROVIDER_SITE_OTHER): Payer: Medicare Other | Admitting: Cardiology

## 2014-12-18 DIAGNOSIS — I714 Abdominal aortic aneurysm, without rupture, unspecified: Secondary | ICD-10-CM

## 2014-12-24 ENCOUNTER — Other Ambulatory Visit: Payer: Self-pay | Admitting: *Deleted

## 2014-12-24 MED ORDER — GABAPENTIN 300 MG PO CAPS: 300.0000 mg | ORAL_CAPSULE | Freq: Two times a day (BID) | ORAL | Status: DC

## 2014-12-24 NOTE — Telephone Encounter (Signed)
Requesting 90 day supply.

## 2015-01-11 DIAGNOSIS — B309 Viral conjunctivitis, unspecified: Secondary | ICD-10-CM | POA: Diagnosis not present

## 2015-01-21 ENCOUNTER — Other Ambulatory Visit: Payer: PRIVATE HEALTH INSURANCE

## 2015-01-24 DIAGNOSIS — I872 Venous insufficiency (chronic) (peripheral): Secondary | ICD-10-CM | POA: Diagnosis not present

## 2015-01-24 DIAGNOSIS — M79609 Pain in unspecified limb: Secondary | ICD-10-CM | POA: Diagnosis not present

## 2015-01-24 DIAGNOSIS — M7989 Other specified soft tissue disorders: Secondary | ICD-10-CM | POA: Diagnosis not present

## 2015-01-24 DIAGNOSIS — I8310 Varicose veins of unspecified lower extremity with inflammation: Secondary | ICD-10-CM | POA: Diagnosis not present

## 2015-01-24 DIAGNOSIS — E669 Obesity, unspecified: Secondary | ICD-10-CM | POA: Diagnosis not present

## 2015-01-25 DIAGNOSIS — H04123 Dry eye syndrome of bilateral lacrimal glands: Secondary | ICD-10-CM | POA: Diagnosis not present

## 2015-01-28 ENCOUNTER — Other Ambulatory Visit (INDEPENDENT_AMBULATORY_CARE_PROVIDER_SITE_OTHER): Payer: Medicare Other

## 2015-01-28 ENCOUNTER — Telehealth: Payer: Self-pay | Admitting: *Deleted

## 2015-01-28 ENCOUNTER — Other Ambulatory Visit: Payer: Self-pay | Admitting: Internal Medicine

## 2015-01-28 DIAGNOSIS — E0789 Other specified disorders of thyroid: Secondary | ICD-10-CM | POA: Diagnosis not present

## 2015-01-28 DIAGNOSIS — C9110 Chronic lymphocytic leukemia of B-cell type not having achieved remission: Secondary | ICD-10-CM | POA: Diagnosis not present

## 2015-01-28 DIAGNOSIS — E890 Postprocedural hypothyroidism: Secondary | ICD-10-CM

## 2015-01-28 LAB — CBC WITH DIFFERENTIAL/PLATELET
Basophils Absolute: 0 10*3/uL (ref 0.0–0.1)
Basophils Relative: 0.3 % (ref 0.0–3.0)
Eosinophils Absolute: 0.1 10*3/uL (ref 0.0–0.7)
Eosinophils Relative: 0.9 % (ref 0.0–5.0)
HEMATOCRIT: 42.9 % (ref 36.0–46.0)
Hemoglobin: 14.4 g/dL (ref 12.0–15.0)
Lymphs Abs: 6.8 10*3/uL — ABNORMAL HIGH (ref 0.7–4.0)
MCHC: 33.5 g/dL (ref 30.0–36.0)
MCV: 85.5 fl (ref 78.0–100.0)
Monocytes Absolute: 0.4 10*3/uL (ref 0.1–1.0)
Monocytes Relative: 3.5 % (ref 3.0–12.0)
NEUTROS ABS: 4.5 10*3/uL (ref 1.4–7.7)
Neutrophils Relative %: 38.2 % — ABNORMAL LOW (ref 43.0–77.0)
Platelets: 181 10*3/uL (ref 150.0–400.0)
RBC: 5.01 Mil/uL (ref 3.87–5.11)
RDW: 13.9 % (ref 11.5–15.5)
WBC: 11.9 10*3/uL — ABNORMAL HIGH (ref 4.0–10.5)

## 2015-01-28 NOTE — Addendum Note (Signed)
Addended by: Karlene Einstein D on: 01/28/2015 11:27 AM   Modules accepted: Orders

## 2015-01-28 NOTE — Telephone Encounter (Signed)
Pt said she was told to come for a tsh?

## 2015-02-05 MED ORDER — LEVOTHYROXINE SODIUM 150 MCG PO TABS: 150.0000 ug | ORAL_TABLET | Freq: Every day | ORAL | Status: DC

## 2015-02-05 NOTE — Assessment & Plan Note (Signed)
I am increasing dose for the second time, goal is tsh suppression given history of thyroid cancer.

## 2015-02-05 NOTE — Addendum Note (Signed)
Addended by: Crecencio Mc on: 02/05/2015 08:24 AM   Modules accepted: Orders

## 2015-02-08 ENCOUNTER — Telehealth: Payer: Self-pay | Admitting: *Deleted

## 2015-02-08 NOTE — Telephone Encounter (Signed)
Error

## 2015-02-09 LAB — T4 AND TSH
T4, Total: 8.6 ug/dL (ref 4.5–12.0)
TSH: 1.17 u[IU]/mL (ref 0.450–4.500)

## 2015-02-14 ENCOUNTER — Emergency Department: Payer: Self-pay | Admitting: Internal Medicine

## 2015-02-14 DIAGNOSIS — S3993XA Unspecified injury of pelvis, initial encounter: Secondary | ICD-10-CM | POA: Diagnosis not present

## 2015-02-14 DIAGNOSIS — S62009A Unspecified fracture of navicular [scaphoid] bone of unspecified wrist, initial encounter for closed fracture: Secondary | ICD-10-CM | POA: Diagnosis not present

## 2015-02-14 DIAGNOSIS — M25552 Pain in left hip: Secondary | ICD-10-CM | POA: Diagnosis not present

## 2015-02-14 DIAGNOSIS — S52615A Nondisplaced fracture of left ulna styloid process, initial encounter for closed fracture: Secondary | ICD-10-CM | POA: Diagnosis not present

## 2015-02-14 DIAGNOSIS — S52502A Unspecified fracture of the lower end of left radius, initial encounter for closed fracture: Secondary | ICD-10-CM | POA: Diagnosis not present

## 2015-02-14 DIAGNOSIS — Z87311 Personal history of (healed) other pathological fracture: Secondary | ICD-10-CM | POA: Diagnosis not present

## 2015-02-14 DIAGNOSIS — S32502A Unspecified fracture of left pubis, initial encounter for closed fracture: Secondary | ICD-10-CM | POA: Diagnosis not present

## 2015-02-14 DIAGNOSIS — S62002A Unspecified fracture of navicular [scaphoid] bone of left wrist, initial encounter for closed fracture: Secondary | ICD-10-CM | POA: Diagnosis not present

## 2015-02-14 DIAGNOSIS — M25551 Pain in right hip: Secondary | ICD-10-CM | POA: Diagnosis not present

## 2015-02-18 ENCOUNTER — Ambulatory Visit: Payer: Self-pay | Admitting: Orthopedic Surgery

## 2015-02-18 DIAGNOSIS — S52532A Colles' fracture of left radius, initial encounter for closed fracture: Secondary | ICD-10-CM | POA: Diagnosis not present

## 2015-02-18 DIAGNOSIS — M1612 Unilateral primary osteoarthritis, left hip: Secondary | ICD-10-CM | POA: Diagnosis not present

## 2015-02-18 DIAGNOSIS — S32599S Other specified fracture of unspecified pubis, sequela: Secondary | ICD-10-CM | POA: Diagnosis not present

## 2015-02-18 DIAGNOSIS — S32512A Fracture of superior rim of left pubis, initial encounter for closed fracture: Secondary | ICD-10-CM | POA: Diagnosis not present

## 2015-02-18 DIAGNOSIS — M25552 Pain in left hip: Secondary | ICD-10-CM | POA: Diagnosis not present

## 2015-02-19 ENCOUNTER — Ambulatory Visit: Payer: Self-pay | Admitting: Orthopedic Surgery

## 2015-02-19 DIAGNOSIS — Z9049 Acquired absence of other specified parts of digestive tract: Secondary | ICD-10-CM | POA: Diagnosis not present

## 2015-02-19 DIAGNOSIS — Z01818 Encounter for other preprocedural examination: Secondary | ICD-10-CM | POA: Diagnosis not present

## 2015-02-19 DIAGNOSIS — E079 Disorder of thyroid, unspecified: Secondary | ICD-10-CM | POA: Diagnosis not present

## 2015-02-19 DIAGNOSIS — Z888 Allergy status to other drugs, medicaments and biological substances status: Secondary | ICD-10-CM | POA: Diagnosis not present

## 2015-02-19 DIAGNOSIS — S52502A Unspecified fracture of the lower end of left radius, initial encounter for closed fracture: Secondary | ICD-10-CM | POA: Diagnosis not present

## 2015-02-19 DIAGNOSIS — Z79899 Other long term (current) drug therapy: Secondary | ICD-10-CM | POA: Diagnosis not present

## 2015-02-19 DIAGNOSIS — S52612A Displaced fracture of left ulna styloid process, initial encounter for closed fracture: Secondary | ICD-10-CM | POA: Diagnosis not present

## 2015-02-19 DIAGNOSIS — Z9889 Other specified postprocedural states: Secondary | ICD-10-CM | POA: Diagnosis not present

## 2015-02-19 DIAGNOSIS — Z79891 Long term (current) use of opiate analgesic: Secondary | ICD-10-CM | POA: Diagnosis not present

## 2015-02-19 DIAGNOSIS — S62002A Unspecified fracture of navicular [scaphoid] bone of left wrist, initial encounter for closed fracture: Secondary | ICD-10-CM | POA: Diagnosis not present

## 2015-02-19 DIAGNOSIS — Z885 Allergy status to narcotic agent status: Secondary | ICD-10-CM | POA: Diagnosis not present

## 2015-02-19 DIAGNOSIS — Z90710 Acquired absence of both cervix and uterus: Secondary | ICD-10-CM | POA: Diagnosis not present

## 2015-02-19 DIAGNOSIS — S52572A Other intraarticular fracture of lower end of left radius, initial encounter for closed fracture: Secondary | ICD-10-CM | POA: Diagnosis not present

## 2015-02-19 DIAGNOSIS — E785 Hyperlipidemia, unspecified: Secondary | ICD-10-CM | POA: Diagnosis not present

## 2015-02-19 DIAGNOSIS — I10 Essential (primary) hypertension: Secondary | ICD-10-CM | POA: Diagnosis not present

## 2015-02-23 DIAGNOSIS — S62102D Fracture of unspecified carpal bone, left wrist, subsequent encounter for fracture with routine healing: Secondary | ICD-10-CM | POA: Diagnosis not present

## 2015-02-23 DIAGNOSIS — W19XXXD Unspecified fall, subsequent encounter: Secondary | ICD-10-CM | POA: Diagnosis not present

## 2015-02-23 DIAGNOSIS — Z87891 Personal history of nicotine dependence: Secondary | ICD-10-CM | POA: Diagnosis not present

## 2015-02-23 DIAGNOSIS — S329XXD Fracture of unspecified parts of lumbosacral spine and pelvis, subsequent encounter for fracture with routine healing: Secondary | ICD-10-CM | POA: Diagnosis not present

## 2015-02-25 DIAGNOSIS — S329XXD Fracture of unspecified parts of lumbosacral spine and pelvis, subsequent encounter for fracture with routine healing: Secondary | ICD-10-CM | POA: Diagnosis not present

## 2015-02-25 DIAGNOSIS — Z87891 Personal history of nicotine dependence: Secondary | ICD-10-CM | POA: Diagnosis not present

## 2015-02-25 DIAGNOSIS — S62102D Fracture of unspecified carpal bone, left wrist, subsequent encounter for fracture with routine healing: Secondary | ICD-10-CM | POA: Diagnosis not present

## 2015-02-25 DIAGNOSIS — W19XXXD Unspecified fall, subsequent encounter: Secondary | ICD-10-CM | POA: Diagnosis not present

## 2015-02-27 DIAGNOSIS — W19XXXD Unspecified fall, subsequent encounter: Secondary | ICD-10-CM | POA: Diagnosis not present

## 2015-02-27 DIAGNOSIS — S329XXD Fracture of unspecified parts of lumbosacral spine and pelvis, subsequent encounter for fracture with routine healing: Secondary | ICD-10-CM | POA: Diagnosis not present

## 2015-02-27 DIAGNOSIS — S62102D Fracture of unspecified carpal bone, left wrist, subsequent encounter for fracture with routine healing: Secondary | ICD-10-CM | POA: Diagnosis not present

## 2015-02-27 DIAGNOSIS — Z87891 Personal history of nicotine dependence: Secondary | ICD-10-CM | POA: Diagnosis not present

## 2015-03-01 DIAGNOSIS — S62102D Fracture of unspecified carpal bone, left wrist, subsequent encounter for fracture with routine healing: Secondary | ICD-10-CM | POA: Diagnosis not present

## 2015-03-01 DIAGNOSIS — S329XXD Fracture of unspecified parts of lumbosacral spine and pelvis, subsequent encounter for fracture with routine healing: Secondary | ICD-10-CM | POA: Diagnosis not present

## 2015-03-01 DIAGNOSIS — W19XXXD Unspecified fall, subsequent encounter: Secondary | ICD-10-CM | POA: Diagnosis not present

## 2015-03-01 DIAGNOSIS — Z87891 Personal history of nicotine dependence: Secondary | ICD-10-CM | POA: Diagnosis not present

## 2015-03-04 DIAGNOSIS — W19XXXD Unspecified fall, subsequent encounter: Secondary | ICD-10-CM | POA: Diagnosis not present

## 2015-03-04 DIAGNOSIS — Z87891 Personal history of nicotine dependence: Secondary | ICD-10-CM | POA: Diagnosis not present

## 2015-03-04 DIAGNOSIS — S62102D Fracture of unspecified carpal bone, left wrist, subsequent encounter for fracture with routine healing: Secondary | ICD-10-CM | POA: Diagnosis not present

## 2015-03-04 DIAGNOSIS — S329XXD Fracture of unspecified parts of lumbosacral spine and pelvis, subsequent encounter for fracture with routine healing: Secondary | ICD-10-CM | POA: Diagnosis not present

## 2015-03-06 DIAGNOSIS — Z87891 Personal history of nicotine dependence: Secondary | ICD-10-CM | POA: Diagnosis not present

## 2015-03-06 DIAGNOSIS — Z967 Presence of other bone and tendon implants: Secondary | ICD-10-CM | POA: Diagnosis not present

## 2015-03-06 DIAGNOSIS — S329XXD Fracture of unspecified parts of lumbosacral spine and pelvis, subsequent encounter for fracture with routine healing: Secondary | ICD-10-CM | POA: Diagnosis not present

## 2015-03-06 DIAGNOSIS — S62102D Fracture of unspecified carpal bone, left wrist, subsequent encounter for fracture with routine healing: Secondary | ICD-10-CM | POA: Diagnosis not present

## 2015-03-06 DIAGNOSIS — W19XXXD Unspecified fall, subsequent encounter: Secondary | ICD-10-CM | POA: Diagnosis not present

## 2015-03-06 DIAGNOSIS — Z8781 Personal history of (healed) traumatic fracture: Secondary | ICD-10-CM | POA: Diagnosis not present

## 2015-03-08 DIAGNOSIS — S62102D Fracture of unspecified carpal bone, left wrist, subsequent encounter for fracture with routine healing: Secondary | ICD-10-CM | POA: Diagnosis not present

## 2015-03-08 DIAGNOSIS — W19XXXD Unspecified fall, subsequent encounter: Secondary | ICD-10-CM | POA: Diagnosis not present

## 2015-03-08 DIAGNOSIS — S329XXD Fracture of unspecified parts of lumbosacral spine and pelvis, subsequent encounter for fracture with routine healing: Secondary | ICD-10-CM | POA: Diagnosis not present

## 2015-03-08 DIAGNOSIS — Z87891 Personal history of nicotine dependence: Secondary | ICD-10-CM | POA: Diagnosis not present

## 2015-03-11 ENCOUNTER — Telehealth: Payer: Self-pay | Admitting: Nurse Practitioner

## 2015-03-11 DIAGNOSIS — S329XXD Fracture of unspecified parts of lumbosacral spine and pelvis, subsequent encounter for fracture with routine healing: Secondary | ICD-10-CM | POA: Diagnosis not present

## 2015-03-11 DIAGNOSIS — W19XXXD Unspecified fall, subsequent encounter: Secondary | ICD-10-CM | POA: Diagnosis not present

## 2015-03-11 DIAGNOSIS — S62102D Fracture of unspecified carpal bone, left wrist, subsequent encounter for fracture with routine healing: Secondary | ICD-10-CM | POA: Diagnosis not present

## 2015-03-11 DIAGNOSIS — Z87891 Personal history of nicotine dependence: Secondary | ICD-10-CM | POA: Diagnosis not present

## 2015-03-11 NOTE — Telephone Encounter (Signed)
Patient would like to know if she could do her lab work and wellness exam at the the same time,

## 2015-03-13 DIAGNOSIS — H02006 Unspecified entropion of left eye, unspecified eyelid: Secondary | ICD-10-CM | POA: Diagnosis not present

## 2015-03-15 DIAGNOSIS — S329XXD Fracture of unspecified parts of lumbosacral spine and pelvis, subsequent encounter for fracture with routine healing: Secondary | ICD-10-CM | POA: Diagnosis not present

## 2015-03-15 DIAGNOSIS — Z87891 Personal history of nicotine dependence: Secondary | ICD-10-CM | POA: Diagnosis not present

## 2015-03-15 DIAGNOSIS — W19XXXD Unspecified fall, subsequent encounter: Secondary | ICD-10-CM | POA: Diagnosis not present

## 2015-03-15 DIAGNOSIS — S62102D Fracture of unspecified carpal bone, left wrist, subsequent encounter for fracture with routine healing: Secondary | ICD-10-CM | POA: Diagnosis not present

## 2015-03-18 DIAGNOSIS — W19XXXD Unspecified fall, subsequent encounter: Secondary | ICD-10-CM | POA: Diagnosis not present

## 2015-03-18 DIAGNOSIS — S329XXD Fracture of unspecified parts of lumbosacral spine and pelvis, subsequent encounter for fracture with routine healing: Secondary | ICD-10-CM | POA: Diagnosis not present

## 2015-03-18 DIAGNOSIS — S62102D Fracture of unspecified carpal bone, left wrist, subsequent encounter for fracture with routine healing: Secondary | ICD-10-CM | POA: Diagnosis not present

## 2015-03-18 DIAGNOSIS — Z87891 Personal history of nicotine dependence: Secondary | ICD-10-CM | POA: Diagnosis not present

## 2015-03-19 ENCOUNTER — Other Ambulatory Visit (INDEPENDENT_AMBULATORY_CARE_PROVIDER_SITE_OTHER): Payer: Medicare Other

## 2015-03-19 DIAGNOSIS — E0789 Other specified disorders of thyroid: Secondary | ICD-10-CM | POA: Diagnosis not present

## 2015-03-19 DIAGNOSIS — E890 Postprocedural hypothyroidism: Secondary | ICD-10-CM | POA: Diagnosis not present

## 2015-03-20 LAB — T4 AND TSH
T4, Total: 12 ug/dL (ref 4.5–12.0)
TSH: 0.713 u[IU]/mL (ref 0.450–4.500)

## 2015-03-21 DIAGNOSIS — H02006 Unspecified entropion of left eye, unspecified eyelid: Secondary | ICD-10-CM | POA: Diagnosis not present

## 2015-03-22 DIAGNOSIS — Z87891 Personal history of nicotine dependence: Secondary | ICD-10-CM | POA: Diagnosis not present

## 2015-03-22 DIAGNOSIS — S62102D Fracture of unspecified carpal bone, left wrist, subsequent encounter for fracture with routine healing: Secondary | ICD-10-CM | POA: Diagnosis not present

## 2015-03-22 DIAGNOSIS — W19XXXD Unspecified fall, subsequent encounter: Secondary | ICD-10-CM | POA: Diagnosis not present

## 2015-03-22 DIAGNOSIS — S329XXD Fracture of unspecified parts of lumbosacral spine and pelvis, subsequent encounter for fracture with routine healing: Secondary | ICD-10-CM | POA: Diagnosis not present

## 2015-03-25 ENCOUNTER — Other Ambulatory Visit: Payer: Self-pay | Admitting: Internal Medicine

## 2015-03-25 ENCOUNTER — Encounter: Payer: Self-pay | Admitting: *Deleted

## 2015-03-25 DIAGNOSIS — E559 Vitamin D deficiency, unspecified: Secondary | ICD-10-CM

## 2015-03-25 DIAGNOSIS — S329XXD Fracture of unspecified parts of lumbosacral spine and pelvis, subsequent encounter for fracture with routine healing: Secondary | ICD-10-CM | POA: Diagnosis not present

## 2015-03-25 DIAGNOSIS — C9110 Chronic lymphocytic leukemia of B-cell type not having achieved remission: Secondary | ICD-10-CM

## 2015-03-25 DIAGNOSIS — Z87891 Personal history of nicotine dependence: Secondary | ICD-10-CM | POA: Diagnosis not present

## 2015-03-25 DIAGNOSIS — W19XXXD Unspecified fall, subsequent encounter: Secondary | ICD-10-CM | POA: Diagnosis not present

## 2015-03-25 DIAGNOSIS — S62102D Fracture of unspecified carpal bone, left wrist, subsequent encounter for fracture with routine healing: Secondary | ICD-10-CM | POA: Diagnosis not present

## 2015-03-25 DIAGNOSIS — E890 Postprocedural hypothyroidism: Secondary | ICD-10-CM

## 2015-03-25 DIAGNOSIS — E785 Hyperlipidemia, unspecified: Secondary | ICD-10-CM

## 2015-03-25 MED ORDER — LEVOTHYROXINE SODIUM 175 MCG PO TABS: 175.0000 ug | ORAL_TABLET | Freq: Every day | ORAL | Status: DC

## 2015-03-29 DIAGNOSIS — S329XXD Fracture of unspecified parts of lumbosacral spine and pelvis, subsequent encounter for fracture with routine healing: Secondary | ICD-10-CM | POA: Diagnosis not present

## 2015-03-29 DIAGNOSIS — W19XXXD Unspecified fall, subsequent encounter: Secondary | ICD-10-CM | POA: Diagnosis not present

## 2015-03-29 DIAGNOSIS — S62102D Fracture of unspecified carpal bone, left wrist, subsequent encounter for fracture with routine healing: Secondary | ICD-10-CM | POA: Diagnosis not present

## 2015-03-29 DIAGNOSIS — Z87891 Personal history of nicotine dependence: Secondary | ICD-10-CM | POA: Diagnosis not present

## 2015-04-01 ENCOUNTER — Other Ambulatory Visit: Payer: Self-pay | Admitting: Internal Medicine

## 2015-04-03 DIAGNOSIS — Z87891 Personal history of nicotine dependence: Secondary | ICD-10-CM | POA: Diagnosis not present

## 2015-04-03 DIAGNOSIS — W19XXXD Unspecified fall, subsequent encounter: Secondary | ICD-10-CM | POA: Diagnosis not present

## 2015-04-03 DIAGNOSIS — M84350D Stress fracture, pelvis, subsequent encounter for fracture with routine healing: Secondary | ICD-10-CM | POA: Diagnosis not present

## 2015-04-03 DIAGNOSIS — Z8781 Personal history of (healed) traumatic fracture: Secondary | ICD-10-CM | POA: Diagnosis not present

## 2015-04-03 DIAGNOSIS — S329XXD Fracture of unspecified parts of lumbosacral spine and pelvis, subsequent encounter for fracture with routine healing: Secondary | ICD-10-CM | POA: Diagnosis not present

## 2015-04-03 DIAGNOSIS — Z967 Presence of other bone and tendon implants: Secondary | ICD-10-CM | POA: Diagnosis not present

## 2015-04-03 DIAGNOSIS — S62102D Fracture of unspecified carpal bone, left wrist, subsequent encounter for fracture with routine healing: Secondary | ICD-10-CM | POA: Diagnosis not present

## 2015-04-05 DIAGNOSIS — S329XXD Fracture of unspecified parts of lumbosacral spine and pelvis, subsequent encounter for fracture with routine healing: Secondary | ICD-10-CM | POA: Diagnosis not present

## 2015-04-05 DIAGNOSIS — W19XXXD Unspecified fall, subsequent encounter: Secondary | ICD-10-CM | POA: Diagnosis not present

## 2015-04-05 DIAGNOSIS — Z87891 Personal history of nicotine dependence: Secondary | ICD-10-CM | POA: Diagnosis not present

## 2015-04-05 DIAGNOSIS — S62102D Fracture of unspecified carpal bone, left wrist, subsequent encounter for fracture with routine healing: Secondary | ICD-10-CM | POA: Diagnosis not present

## 2015-04-08 DIAGNOSIS — W19XXXD Unspecified fall, subsequent encounter: Secondary | ICD-10-CM | POA: Diagnosis not present

## 2015-04-08 DIAGNOSIS — S62102D Fracture of unspecified carpal bone, left wrist, subsequent encounter for fracture with routine healing: Secondary | ICD-10-CM | POA: Diagnosis not present

## 2015-04-08 DIAGNOSIS — Z87891 Personal history of nicotine dependence: Secondary | ICD-10-CM | POA: Diagnosis not present

## 2015-04-08 DIAGNOSIS — S329XXD Fracture of unspecified parts of lumbosacral spine and pelvis, subsequent encounter for fracture with routine healing: Secondary | ICD-10-CM | POA: Diagnosis not present

## 2015-04-10 DIAGNOSIS — Z87891 Personal history of nicotine dependence: Secondary | ICD-10-CM | POA: Diagnosis not present

## 2015-04-10 DIAGNOSIS — S62102D Fracture of unspecified carpal bone, left wrist, subsequent encounter for fracture with routine healing: Secondary | ICD-10-CM | POA: Diagnosis not present

## 2015-04-10 DIAGNOSIS — W19XXXD Unspecified fall, subsequent encounter: Secondary | ICD-10-CM | POA: Diagnosis not present

## 2015-04-10 DIAGNOSIS — S329XXD Fracture of unspecified parts of lumbosacral spine and pelvis, subsequent encounter for fracture with routine healing: Secondary | ICD-10-CM | POA: Diagnosis not present

## 2015-04-12 DIAGNOSIS — S62102D Fracture of unspecified carpal bone, left wrist, subsequent encounter for fracture with routine healing: Secondary | ICD-10-CM | POA: Diagnosis not present

## 2015-04-12 DIAGNOSIS — Z87891 Personal history of nicotine dependence: Secondary | ICD-10-CM | POA: Diagnosis not present

## 2015-04-12 DIAGNOSIS — S329XXD Fracture of unspecified parts of lumbosacral spine and pelvis, subsequent encounter for fracture with routine healing: Secondary | ICD-10-CM | POA: Diagnosis not present

## 2015-04-12 DIAGNOSIS — W19XXXD Unspecified fall, subsequent encounter: Secondary | ICD-10-CM | POA: Diagnosis not present

## 2015-04-15 DIAGNOSIS — Z87891 Personal history of nicotine dependence: Secondary | ICD-10-CM | POA: Diagnosis not present

## 2015-04-15 DIAGNOSIS — S62102D Fracture of unspecified carpal bone, left wrist, subsequent encounter for fracture with routine healing: Secondary | ICD-10-CM | POA: Diagnosis not present

## 2015-04-15 DIAGNOSIS — W19XXXD Unspecified fall, subsequent encounter: Secondary | ICD-10-CM | POA: Diagnosis not present

## 2015-04-15 DIAGNOSIS — S329XXD Fracture of unspecified parts of lumbosacral spine and pelvis, subsequent encounter for fracture with routine healing: Secondary | ICD-10-CM | POA: Diagnosis not present

## 2015-04-17 DIAGNOSIS — S329XXD Fracture of unspecified parts of lumbosacral spine and pelvis, subsequent encounter for fracture with routine healing: Secondary | ICD-10-CM | POA: Diagnosis not present

## 2015-04-17 DIAGNOSIS — Z87891 Personal history of nicotine dependence: Secondary | ICD-10-CM | POA: Diagnosis not present

## 2015-04-17 DIAGNOSIS — W19XXXD Unspecified fall, subsequent encounter: Secondary | ICD-10-CM | POA: Diagnosis not present

## 2015-04-17 DIAGNOSIS — S62102D Fracture of unspecified carpal bone, left wrist, subsequent encounter for fracture with routine healing: Secondary | ICD-10-CM | POA: Diagnosis not present

## 2015-04-19 DIAGNOSIS — S62102D Fracture of unspecified carpal bone, left wrist, subsequent encounter for fracture with routine healing: Secondary | ICD-10-CM | POA: Diagnosis not present

## 2015-04-19 DIAGNOSIS — S329XXD Fracture of unspecified parts of lumbosacral spine and pelvis, subsequent encounter for fracture with routine healing: Secondary | ICD-10-CM | POA: Diagnosis not present

## 2015-04-19 DIAGNOSIS — Z87891 Personal history of nicotine dependence: Secondary | ICD-10-CM | POA: Diagnosis not present

## 2015-04-19 DIAGNOSIS — W19XXXD Unspecified fall, subsequent encounter: Secondary | ICD-10-CM | POA: Diagnosis not present

## 2015-04-19 NOTE — Discharge Summary (Signed)
PATIENT NAME:  Lisa Figueroa, Lisa Figueroa MR#:  [id number] DATE OF BIRTH:  15-Mar-1932  DATE OF ADMISSION:  06/27/2013 DATE OF DISCHARGE:    CONSULTANTS: Dr. Rudene Christians from orthopedics and PT.    CHIEF COMPLAINT: Status post fall and pelvic pain.   PRIMARY CARE PHYSICIAN: Dr. Brunetta Genera.   PRIMARY ONCOLOGIST: Dr. Oliva Bustard.  DISCHARGE DIAGNOSES:  1.  Fall and left-sided superior and inferior pubic rami fractures.  2.  Hypothyroidism.  3.  Hypertension. 4.  Hyperlipidemia.  5.  Chronic lymphocytic leukemia from axillary lymph node biopsy. PET scan pending as an outpatient. 6.  Constipation.  7.  History of thyroid cancer, status post total thyroidectomy.   DISCHARGE MEDICATIONS: Synthroid 125 mcg daily, losartan 100 mg daily, vitamin D 2000 international units daily, simvastatin 20 mg daily, acetaminophen 650 mg 3 times a day, tramadol 50 mg 1 tab every 4 hours as needed for pain, MiraLax 17 grams once a day as needed for constipation and senna 1 tab 2 times a day.   DIET: Low-sodium, low-fat, low-cholesterol.   ACTIVITY: As tolerated.   FOLLOWUP: Please follow with PCP within 1 to 2 weeks.   DISPOSITION: She will be getting discharged to rehab facility.   SIGNIFICANT LABS AND IMAGING: CT of the pelvis without contrast showed mildly comminuted fracture of the superior and inferior left pubic rami and nondisplaced left sacral ala fracture. Left knee complete x-rays showing no acute bony abnormalities of the knee. Left femoral fracture x-ray showed no acute bony abnormalities of the left femur. Chest portable 1 view shows no evidence of acute posttraumatic injury.   HISTORY OF PRESENT ILLNESS AND HOSPITAL COURSE: For full details of H and P, please see the dictation on 07/01 by Dr. Tressia Miners, but briefly this is a pleasant 81 year old good functioning female with history of lymphoproliferative disorder, hypertension, hypothyroidism, hyperlipidemia, status post a fall complaining of left leg pain and also  pelvic pain. She was admitted to the hospitalist service. She is active and walks without a cane or a walker. She was found with a pelvic fracture as stated above and admitted to the hospitalist service for pain management and PT. The patient was also seen by Dr. Rudene Christians from orthopedics. This is a nonsurgical fracture and her pain medications were adjusted to control her symptoms. It usually takes 4 to 6 weeks before the fractures are healed adequately per Dr. Rudene Christians. She has been working with PT. Her vitals have remained stable. She did have some constipation and needed a bowel regimen. Her blood pressure is stable. She is to follow with Dr. Oliva Bustard as an outpatient for her CLL. She has been ambulating progressively more with PT and at this time she will be discharged to rehab.   TOTAL TIME SPENT: 32 minutes.  ____________________________ Vivien Presto, MD sa:aw D: 07/03/2013 13:35:55 ET T: 07/03/2013 13:58:47 ET JOB#: [id number]  cc: Vivien Presto, MD, <Dictator> Meindert A. Brunetta Genera, MD Martie Lee. Oliva Bustard, MD Vivien Presto MD ELECTRONICALLY SIGNED 07/24/2013 14:33

## 2015-04-19 NOTE — Consult Note (Signed)
Brief Consult Note: Diagnosis: pelvic fracture.   Orders entered.   Comments: stable pelvic fracture, WBAT.  Electronic Signatures: Laurene Footman (MD)  (Signed 02-Jul-14 08:03)  Authored: Brief Consult Note   Last Updated: 02-Jul-14 08:03 by Laurene Footman (MD)

## 2015-04-19 NOTE — H&P (Signed)
PATIENT NAME:  Lisa Figueroa, Lisa Figueroa MR#:  [id number] DATE OF BIRTH:  1932-05-17  DATE OF ADMISSION:  06/27/2013  ADMITTING PHYSICIAN: Gladstone Lighter, MD  PRIMARY CARE PHYSICIAN: Lisa Market, MD  PRIMARY ONCOLOGIST: Lisa Gleason, MD  CHIEF COMPLAINT: Fall and left leg and pelvic pain.   HISTORY OF PRESENT ILLNESS: Lisa Figueroa is a pleasant 81 year old Caucasian female with past medical history significant for possible CLL and in the process of being worked up for that, underlying lymphoproliferative disorder, hypertension, hypothyroidism and hyperlipidemia who presents to the hospital after she had a fall and complaining of left leg and also pelvic pain. The patient is very active and functional at baseline. She has been walking without any cane or walker. She is working part-time at [employer] and went to work today. She was getting down the stairs and had a mechanical fall from 3 steps above onto the ground falling on her left side. She had extreme left leg pain as soon as she fell and was brought to the hospital. The x-rays revealed that she has left-sided superior and inferior pubic rami fractures.  She is being admitted for the same, pain management and possible rehab placement. She denies any fevers, chills or recent weakness. No chest pain, nausea, vomiting or dyspnea. She has been having double vision for the past several months now and has been seeing Dr. George Ina for the same. She believes that this is a purely mechanical fall. She did not notice the steps below her and tripped and fell.  PAST MEDICAL HISTORY: 1.  History of thyroid cancer, status post total thyroidectomy followed by hypothyroidism secondary to the same.  2.  Hypertension.  3.  Hyperlipidemia.  4.  Lymphoproliferative disorder being worked up by Dr. Oliva Bustard and possible diagnosis of CLL given at this time.   PAST SURGICAL HISTORY: 1.  Total thyroidectomy.  2.  Cholecystectomy.  3.  Hemorrhoidectomy.  4.   Appendectomy.  5.  Hysterectomy.   ALLERGIES: INTOLERANT TO PREDNISONE.   CURRENT HOME MEDICATIONS:  1.  Losartan 100 mg p.o. daily. 2.  Synthroid 125 mcg p.o. daily.  3.  Simvastatin 20 mg p.o. daily.  4.  Vitamin D3 2000 international units p.o. daily.   SOCIAL HISTORY: Lives at home by herself. No history of any smoking.  Occasional alcohol use, drinks wine. Again very functional and active at baseline and currently works part-time at [employer].  FAMILY HISTORY: Significant for history of cancer in the family. Both parents had heart disease and died in their 48s.  Brother with metastatic prostate cancer and another sister with possible brain cancer.   REVIEW OF SYSTEMS:   CONSTITUTIONAL: No fever, fatigue or weakness.  EYES: Positive for double vision, also uses reading glasses. No glaucoma or cataracts.  ENT: No tinnitus, ear pain, hearing loss, epistaxis or discharge.  RESPIRATORY: No cough, wheeze, hemoptysis or COPD. CARDIOVASCULAR: No chest pain, orthopnea, edema, arrhythmia, palpitations or syncope.  GASTROINTESTINAL: No nausea, vomiting, diarrhea, abdominal pain, hematemesis or melena.  GENITOURINARY: No dysuria, hematuria, renal calculus, frequency or incontinence. Positive for nocturia.  ENDOCRINE: No polyuria. Positive for nocturia and also positive for hypothyroidism. No heat or cold intolerance.  HEMATOLOGY: No anemia, easy bruising or bleeding.  SKIN: No acne, rash or lesions.  MUSCULOSKELETAL: Positive for left leg pain, left knee pain and also at the pelvic region pain. No joint tenderness or effusion otherwise. NEUROLOGIC: No numbness, weakness, CVA, TIA or seizures. PSYCHOLOGICAL:  No anxiety, insomnia, depression.   PHYSICAL  EXAMINATION: VITAL SIGNS: Temperature 98.7 degrees Fahrenheit, pulse 65, respirations 18, blood pressure 103/68 and, pulse ox 97% on 2 liters oxygen.  GENERAL: Well-built, well-nourished female lying in bed, not in any acute distress.   HEENT: Normocephalic, atraumatic. Pupils equal, round and reacting to light. Anicteric sclerae. Extraocular movements intact. Oropharynx clear without erythema, mass or exudates.  NECK: Supple. No thyromegaly, JVD or carotid bruits. No lymphadenopathy.  LUNGS: Moving air bilaterally. No wheeze or crackles.  No use of accessory muscles for breathing.  HEART:  S1 and S2 regular rate and rhythm. There is a 3/6 systolic murmur in the mitral area. No rubs or gallops.  ABDOMEN: Soft, nontender, nondistended. No hepatosplenomegaly. Normal bowel sounds.  EXTREMITIES: No pedal edema. No clubbing or cyanosis.  The left leg is externally rotated and guarded secondary to pain, especially in the pelvic region. She has 2+ dorsalis pedis pulses palpable bilaterally.  SKIN: No acne, rash or lesions. Seems well hydrated.  NEUROLOGICAL: Cranial nerves are intact. Strength is 5 out of 5 in all 4 extremities, except exam on the left hip is limited secondary to pain. Sensation is intact and deep tendon reflexes are 2+ in right lower extremity and also both upper extremities. PSYCHOLOGIC:  The patient is awake, alert and oriented x 3.   LABORATORY DATA: WBC 22.8, hemoglobin 13.9, hematocrit 42.1 and platelet count 174.   Sodium 134, potassium 3.9, chloride 100, bicarb 29, BUN 12, creatinine 0.78, glucose 110 and calcium of 9.1.   ALT 24, AST 27, alk phos 83, total bilirubin 0.7 and albumin of 4.1. INR 1.0. TSH within normal limits at 0.984.  Urinalysis negative for any infection.   Pelvic x-ray showing fractures of superior and inferior pubic rami on the left. Left femoral x-ray showing no acute bony abnormality. Left knee x-ray showing degenerative changes, age-appropriate. No abnormality. Chest x-ray is showing no evidence of any injury. No pneumothorax or pulmonary contusion or pleural effusion seen. CT of the pelvis without contrast showing mildly comminuted fracture of the superior and inferior left pubic rami.   Nondisplaced left sacral ala fracture also noted.   EKG: Normal sinus rhythm. No acute ST-T wave abnormalities.   ASSESSMENT AND PLAN: A 81 year old female with past medical history of thyroid cancer status post total thyroidectomy, hypertension and hyperlipidemia admitted after a fall and pubic ramus fracture. 1.  Fall and left side superior and inferior pubic rami fractures secondary to mechanical fall. Will admit to ortho floor.  Pain management with morphine and Percocet has been ordered. She will need ortho consult, likely nonsurgical and conservative management recommended. Will get physical therapy consult and likely rehab placement.  2.  Hypothyroidism secondary to her thyroid surgery. Continue Synthroid supplement. TSH is within normal limits.  3.  Leukocytosis, likely stress reaction. No source of any infection identified. Will hold off on antibiotics and recheck in the morning.  4.  Hyperlipidemia. Continue her simvastatin.  5.  Hypertension. Continue losartan at lower dose. 6.  Chronic lymphocytic leukemia based on her lymph node biopsy. Follows with Dr. Oliva Bustard. PET scan could not be done tomorrow because of her recent fractures. Probably has to be scheduled as an outpatient.  7.  Gastrointestinal and deep vein thrombosis prophylaxis with Protonix and also sub-Q heparin.    CODE STATUS: FULL CODE.   TIME SPENT ON ADMISSION: 50 minutes. ____________________________ Gladstone Lighter, MD rk:sb D: 06/27/2013 15:30:02 ET T: 06/27/2013 16:10:21 ET JOB#: [id number]  cc: Gladstone Lighter, MD, <Dictator> Janak K. Choksi, MD  Meindert A. Brunetta Genera, MD Gladstone Lighter MD ELECTRONICALLY SIGNED 07/24/2013 13:23

## 2015-04-19 NOTE — Consult Note (Signed)
PATIENT NAME:  Lisa Figueroa, Lisa Figueroa MR#:  [id number] DATE OF BIRTH:  [birth year]  DATE OF CONSULTATION:  06/29/2013  CONSULTING PHYSICIAN:  Laurene Footman, MD  REASON FOR CONSULTATION: Pelvic fracture.   HISTORY OF PRESENT ILLNESS: The patient is a 81 year old woman who was working at the [employer].  She stumbled over a concrete step, fell hard and was unable to get up after this. She was brought to the Emergency Room where x-rays and CT scan showed pelvic fractures, ischial and pubic rami, with slight displacement. 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 She is fairly active for her age, is generally very independent.   PHYSICAL EXAMINATION: On exam, she did not have pain with log rolling.  She is neurovascularly intact distally. She does have pain over the left anterior pelvis. There is no swelling or bruising at present.  CT and x-rays were reviewed, and they show minimally displaced rami fractures.   CLINICAL IMPRESSION: Pelvic fracture, stable fracture pattern.   RECOMMENDATION: Weight-bearing as tolerated.  I  explained to her that it usually takes 4 to 6 weeks before her fractures are healed adequately to resume independent living. She understands that, and we will try to keep pain under control and work with physical therapy. We will have her follow up with me in one month after discharge.   ____________________________ Laurene Footman, MD mjm:cb D: 06/29/2013 18:32:04 ET T: 06/29/2013 20:34:33 ET JOB#: [id number]  cc: Laurene Footman, MD, <Dictator> Laurene Footman MD ELECTRONICALLY SIGNED 06/29/2013 23:57

## 2015-04-20 NOTE — H&P (Signed)
PATIENT NAME:  Lisa Figueroa, Lisa Figueroa MR#:  [id number] DATE OF BIRTH:  25-Apr-1932  DATE OF ADMISSION:  02/07/2014  PRIMARY CARE PHYSICIAN: Meindert A. Brunetta Genera, MD  REFERRING EMERGENCY ROOM PHYSICIAN: [place] Owens Shark, MD   CHIEF COMPLAINT: Rash and pain.  HISTORY OF PRESENT ILLNESS: The patient is a 81 year old female with past medical history of hypertension, hypothyroidism and chronic lymphocytic leukemia, who was diagnosed with shingles approximately 2 weeks ago. She was given Valtrex by her primary care physician and finished taking that medicine, but the rash in the left thoracic area is getting worse and red. Also, the patient is experiencing severe pain in that area. The patient is concerned about the worsening of the rash and came into the ER. The patient was given IV clindamycin 900 mg, and hospitalist team is called to admit the patient. During my examination, the patient is resting comfortably. Her sister is at bedside. Denies any chest pain or shortness of breath. Denies any abdominal pain, nausea, vomiting, diarrhea. No other complaints. Denies any fevers.   PAST MEDICAL HISTORY:  1. History of thyroid cancer status post total thyroidectomy, followed by hypothyroidism. 2. Hypertension.  3. Hyperlipidemia.  4. Lymphoproliferative disorder, possibly CLL.   PAST SURGICAL HISTORY:  1. Total thyroidectomy from thyroid cancer. 2. Hemorrhoidectomy.  3. Appendectomy.  4. Hysterectomy.  5. Cholecystectomy.   ALLERGIES: PREDNISONE AND PERCOCET.  HOME MEDICATIONS: 1. Losartan 100 mg p.o. once daily. 2. Synthroid 125 mcg p.o. once daily. 3. Simvastatin 20 mg p.o. daily.  4. Vitamin D3 2000 international units p.o. daily. 5. Tramadol 50 mg 1 tablet p.o. q.4 hours as needed for pain.   PSYCHOSOCIAL HISTORY: Lives at home by herself. Denies any history of smoking, alcohol or illicit drug usage.   FAMILY HISTORY: Both parents had heart disease and died in their 4s. Father had  hypertension. Brother with metastatic prostate cancer, another sister with probable brain cancer.   REVIEW OF SYSTEMS: CONSTITUTIONAL: Denies any weight loss or weight gain. Complaining of fatigue and weakness.  EYES: Denies blurry vision or double vision.  ENT: Denies epistaxis or discharge. RESPIRATION: Denies cough, COPD.  CARDIOVASCULAR: No chest pain, palpitations.  GASTROINTESTINAL: Denies nausea, vomiting, diarrhea. No hematemesis, no abdominal pain.  GENITOURINARY: No dysuria or hematuria.  GYNECOLOGIC AND BREASTS: Denies breast mass or vaginal discharge.  ENDOCRINE: Denies polyuria, nocturia. Has hypothyroidism after a total thyroidectomy. HEMATOLOGIC AND LYMPHATIC: No anemia, easy bruising or bleeding.  INTEGUMENTARY: No acne, no rash. Positive herpes zoster lesions on the left thoracic dermatome for the past 2 weeks. Skin is warm to touch, normal turgor.   MUSCULOSKELETAL: No joint effusion, tenderness or erythema. PSYCHIATRIC: Normal mood and affect.   LABORATORY AND IMAGING STUDIES: CHEM-8 is normal except sodium is 129, chloride is 99, anion gap at 5. LFTs normal except AST and ALT which are below normal. CBC is normal.   ASSESSMENT AND PLAN: A 81 year old with a 2-week history of shingles on the left inframammary area, rash.   1. Superimposed cellulitis on the herpes zoster on left thoracic dermatome. The patient has received Valtrex as prescribed by primary care physician. Will provide her meropenem for the superficial cellulitis and pain management with Toradol IV.  2. Chronic lymphocytic leukemia. Outpatient followup with oncology as recommended.  3. Hypertension. Will resume her home medications.  4. Hypothyroidism. Continue Synthroid.  5. Will provide gastrointestinal and deep vein thrombosis prophylaxis.  The plan of care was discussed in detail with the patient. She is aware of the  plan.  ____________________________ Nicholes Mango, MD ag:lb D: 02/07/2014 05:06:09  ET T: 02/07/2014 05:46:08 ET JOB#: [id number]  cc: Nicholes Mango, MD, <Dictator> Meindert A. Brunetta Genera, MD Nicholes Mango MD ELECTRONICALLY SIGNED 02/22/2014 18:43

## 2015-04-20 NOTE — Consult Note (Signed)
PATIENT NAME:  Lisa Figueroa, Lisa Figueroa MR#:  [id number] DATE OF BIRTH:  Feb 25, 1932  DATE OF CONSULTATION:  02/07/2014  REFERRING PHYSICIAN:  Dr. Margaretmary Eddy.  CONSULTING PHYSICIAN:  Cheral Marker. Ola Spurr, MD  REASON FOR CONSULTATION: Shingles and cellulitis.   HISTORY OF PRESENT ILLNESS: This is a very pleasant 81 year old female with a history of hypertension, hyperthyroidism and CLL who was admitted with severe pain from shingles and overlying cellulitis. She was diagnosed approximately two weeks ago and was given Valtrex by  her primary care physician. However, the pain has become much worse. She was noted to have probable overlying cellulitis and was admitted through the Emergency Room.   Currently she has been given a dose of clindamycin in the ER and is on meropenem. She reports the pain is quite severe, not being touched by tramadol or Tylenol. She has had no fevers, chills, or night sweats.   PAST MEDICAL HISTORY:  1.  Thyroid cancer status post thyroidectomy.  2.  Hypertension.  3.  Hyperlipidemia.  4.  Lymphoproliferative disorder.   PAST SURGICAL HISTORY:  1.  Total thyroidectomy.  2.  Hemorrhoidectomy.  3.  Appendectomy.  4.  Hysterectomy.  5.  Cholecystectomy.   ALLERGIES: PREDNISONE AND PERCOCET.   HOME MEDICATIONS: Losartan, Synthroid, simvastatin, D3, tramadol.   CURRENT ANTIBIOTICS: Include meropenem and she received a dose of clindamycin. She is also on tramadol and morphine for pain.   SOCIAL HISTORY: The patient lives alone. She does not smoke.   FAMILY HISTORY: Positive for heart disease in her parents. Brother with metastatic prostate cancer.   REVIEW OF SYSTEMS: Eleven systems reviewed and negative except as per HPI.   PHYSICAL EXAMINATION:  VITAL SIGNS: T-max is 98.7. Pulse is 73. Blood pressure 161/60, pulse ox 94%.  GENERAL: Pleasant, interactive, in no distress, lying in bed but does appear in pain with any movement.  HEENT: Pupils equal, round, and reactive to  light and accommodation.  OROPHARYNX: Clear with no thrush.  NECK: Supple.  HEART: Regular.  LUNGS: Clear.  ABDOMEN: Soft, nontender.  SKIN: A severe dermatomal rash wrapping around from the left under her breast. There are  necrotic areas as well overlying blistered skin. There does appear to be a cellulitic component. There is also some satellite lesions which could suggest a fungal infection.   LABORATORY DATA: Labs are reviewed. White count 10.7, hemoglobin 14.5, platelets 159, sodium low at 129. Otherwise, CMETs normal.   IMPRESSION: A 81 year old relatively healthy female but with underlying lymphoproliferative disorder who follows with Dr. Oliva Bustard admitted with severe pain from her zoster as well as overlying cellulitis.   RECOMMENDATIONS:  1.  Restart Valtrex.  2.  Changed meropenem to Ancef.  3.  Add some fluconazole for possible overlying fungal infection. It would be difficult for her to tolerate topical antifungals.  4.  Aggressive pain control. I have added gabapentin 300 mg three times a day. We can also have her take pain meds as needed as well. We could also had amitriptyline if needed.   Thank you for the consult. I will be glad to follow with you.   ____________________________ Cheral Marker. Ola Spurr, MD dpf:np D: 02/07/2014 15:03:07 ET T: 02/07/2014 15:27:25 ET JOB#: [id number]  cc: Cheral Marker. Ola Spurr, MD, <Dictator> Jonita Hirota Ola Spurr MD ELECTRONICALLY SIGNED 02/07/2014 20:52

## 2015-04-20 NOTE — Discharge Summary (Signed)
PATIENT NAME:  Lisa Figueroa, Lisa Figueroa MR#:  [id number] DATE OF BIRTH:  07-29-1932  DATE OF ADMISSION:  02/07/2014 DATE OF DISCHARGE:  02/14/2014  REASON FOR ADMISSION: Rash of the back and under the breast.   DISCHARGE DIAGNOSES:  1.  Herpes zoster with secondary other infection with Candida and also on top of that cellulitis. 2.  History of chronic lymphocytic leukemia. 3.  Hypertension.  4.  Hypothyroidism.   DISPOSITION: Home.   DISCHARGE MEDICATIONS: 1.  Calciferol 200 units once a day. 2.  Losartan 100 mg once a day. 3.  Simvastatin 20 mg once a day. 4.  Levothyroxine 125 mcg once a day. 5.  Tylenol as needed for fever or pain. 6.  Tramadol 50 mg every 4 hours as needed for pain. 7.  Senna as needed for constipation.  8.  Valacyclovir. The patient took 1 every 8 hours to be stopped the day of discharge. 9.  Oxycodone 5 mg every 4 hours for the next 8 days.  10.  Gabapentin 300 mg 3 times daily. 11.  Fluconazole 100 mg once a day for 3 days.  12.  Amlodipine 5 mg once a day. 13.  Amoxicillin 875 mg every 12 hours for the next 5 days. 14.  Bacitracin 500 mg applied to effected areas.  DISPOSITION: To home.  DISCHARGE INSTRUCTIONS: Follow up with Dr. Brunetta Genera.  HISTORY OF PRESENT ILLNESS: This is a very nice 81 year old female who history of hypertension, thyroid cancer, lymphoproliferative disorder, and hyperlipidemia who comes to the Emergency Department with 2 week diagnosis of shingles that started developing, overlying cellulitis, and on top of that candida infection. The patient was started on Valtrex by her primary care physician and Gabapentin for control of her post-herpetic neuralgia. The patient developed cellulitis for what she presented to the Emergency Room. In the Emergency Room, the patient was treated. She was started on Unasyn. Valtrex was restarted. Fluconazole was added. She was started on Meropenem and then Dr. Ola Spurr changed it to Ancef. The patient remained in  the hospital to control the pain. She had some other lesions not likely to be herpetic, mostly cellulitic, but after the patient had all the lesions crusted very well. She was taken off droplet precautions. The patient actually did well during this hospitalization and her hospitalization was prolonged mostly for issues secondary to pain and some due to be a contagious disease for immunosuppresed patients.Marland Kitchen  TIME SPENT: About 45 minutes.  ____________________________ [place] Sink, MD rsg:sb D: 02/18/2014 23:38:15 ET T: 02/19/2014 07:42:31 ET JOB#: [id number]  cc: Ashton Sink, MD, <Dictator> Meindert A. Brunetta Genera, MD Roselie Awkward America Brown MD ELECTRONICALLY SIGNED 02/24/2014 11:03

## 2015-04-21 DIAGNOSIS — W19XXXD Unspecified fall, subsequent encounter: Secondary | ICD-10-CM | POA: Diagnosis not present

## 2015-04-21 DIAGNOSIS — S62102D Fracture of unspecified carpal bone, left wrist, subsequent encounter for fracture with routine healing: Secondary | ICD-10-CM | POA: Diagnosis not present

## 2015-04-21 DIAGNOSIS — S329XXD Fracture of unspecified parts of lumbosacral spine and pelvis, subsequent encounter for fracture with routine healing: Secondary | ICD-10-CM | POA: Diagnosis not present

## 2015-04-22 DIAGNOSIS — S62102D Fracture of unspecified carpal bone, left wrist, subsequent encounter for fracture with routine healing: Secondary | ICD-10-CM | POA: Diagnosis not present

## 2015-04-22 DIAGNOSIS — S329XXD Fracture of unspecified parts of lumbosacral spine and pelvis, subsequent encounter for fracture with routine healing: Secondary | ICD-10-CM | POA: Diagnosis not present

## 2015-04-22 DIAGNOSIS — Z87891 Personal history of nicotine dependence: Secondary | ICD-10-CM | POA: Diagnosis not present

## 2015-04-22 DIAGNOSIS — W19XXXD Unspecified fall, subsequent encounter: Secondary | ICD-10-CM | POA: Diagnosis not present

## 2015-04-23 DIAGNOSIS — W19XXXD Unspecified fall, subsequent encounter: Secondary | ICD-10-CM | POA: Diagnosis not present

## 2015-04-23 DIAGNOSIS — Z87891 Personal history of nicotine dependence: Secondary | ICD-10-CM | POA: Diagnosis not present

## 2015-04-23 DIAGNOSIS — S62102D Fracture of unspecified carpal bone, left wrist, subsequent encounter for fracture with routine healing: Secondary | ICD-10-CM | POA: Diagnosis not present

## 2015-04-23 DIAGNOSIS — S329XXD Fracture of unspecified parts of lumbosacral spine and pelvis, subsequent encounter for fracture with routine healing: Secondary | ICD-10-CM | POA: Diagnosis not present

## 2015-04-24 ENCOUNTER — Other Ambulatory Visit: Payer: Self-pay | Admitting: *Deleted

## 2015-04-24 DIAGNOSIS — C9110 Chronic lymphocytic leukemia of B-cell type not having achieved remission: Secondary | ICD-10-CM

## 2015-04-26 ENCOUNTER — Other Ambulatory Visit: Payer: Self-pay | Admitting: *Deleted

## 2015-04-28 NOTE — Op Note (Signed)
PATIENT NAME:  Lisa Figueroa, Lisa Figueroa MR#:  [id number] DATE OF BIRTH:  1932-01-21  DATE OF PROCEDURE:  02/19/2015  PREOPERATIVE DIAGNOSIS: Comminuted intra-articular left distal radius fracture.   POSTOPERATIVE DIAGNOSIS: Comminuted intra-articular left distal radius fracture.   PROCEDURE: ORIF, left wrist.  ANESTHESIA: General.   SURGEON: Laurene Footman, MD.   DESCRIPTION OF PROCEDURE: The patient was brought to the operating room and, after adequate anesthesia was obtained, the left arm was prepped and draped in the usual sterile fashion with a tourniquet applied to the upper arm. The patient identification and timeout procedures were completed, Finger-trap traction was applied to the distal radius to help to restore length. Next, the tourniquet was raised to 250 mmHg. A volar approach was made centered over the FCR tendon. The tendon was retracted radially protecting the radial artery and veins. The pronator was elevated off the distal fragment and a distal first approach was made with a short narrow DVR plate applied to the volar distal fragment. Next, three 10-mm screws were placed for the plate fixation to the shaft and it gave anatomic restoration. The tourniquet was let down and the wound closed with 3-0 Vicryl subcutaneously, 4-0 nylon for the skin. Xeroform, 4 x 4's, Webril and a volar splint were applied followed by an Ace wrap.  TOURNIQUET TIME: 32 minutes at 250 mmHg.   COMPLICATIONS: None.   SPECIMEN: None.  IMPLANT: [employer] DVR plate, screw, and pegs.    ____________________________ Laurene Footman, MD mjm:jh D: 02/20/2015 08:25:54 ET T: 02/20/2015 11:44:20 ET JOB#: [id number]  cc: Laurene Footman, MD, <Dictator> Laurene Footman MD ELECTRONICALLY SIGNED 02/20/2015 12:32

## 2015-05-01 ENCOUNTER — Encounter: Payer: Self-pay | Admitting: Oncology

## 2015-05-01 ENCOUNTER — Encounter: Payer: PRIVATE HEALTH INSURANCE | Admitting: Nurse Practitioner

## 2015-05-01 ENCOUNTER — Inpatient Hospital Stay: Payer: Medicare Other | Attending: Oncology

## 2015-05-01 ENCOUNTER — Other Ambulatory Visit: Payer: Self-pay | Admitting: *Deleted

## 2015-05-01 ENCOUNTER — Inpatient Hospital Stay (HOSPITAL_BASED_OUTPATIENT_CLINIC_OR_DEPARTMENT_OTHER): Payer: Medicare Other | Admitting: Oncology

## 2015-05-01 VITALS — BP 124/91 | HR 76 | Temp 95.3°F | Resp 8 | Ht 61.0 in | Wt 162.0 lb

## 2015-05-01 DIAGNOSIS — Z803 Family history of malignant neoplasm of breast: Secondary | ICD-10-CM

## 2015-05-01 DIAGNOSIS — C9110 Chronic lymphocytic leukemia of B-cell type not having achieved remission: Secondary | ICD-10-CM | POA: Diagnosis not present

## 2015-05-01 DIAGNOSIS — Z1231 Encounter for screening mammogram for malignant neoplasm of breast: Secondary | ICD-10-CM

## 2015-05-01 DIAGNOSIS — E785 Hyperlipidemia, unspecified: Secondary | ICD-10-CM

## 2015-05-01 DIAGNOSIS — E890 Postprocedural hypothyroidism: Secondary | ICD-10-CM | POA: Diagnosis not present

## 2015-05-01 DIAGNOSIS — Z86010 Personal history of colonic polyps: Secondary | ICD-10-CM | POA: Insufficient documentation

## 2015-05-01 DIAGNOSIS — Z8042 Family history of malignant neoplasm of prostate: Secondary | ICD-10-CM | POA: Diagnosis not present

## 2015-05-01 DIAGNOSIS — Z808 Family history of malignant neoplasm of other organs or systems: Secondary | ICD-10-CM | POA: Insufficient documentation

## 2015-05-01 DIAGNOSIS — I10 Essential (primary) hypertension: Secondary | ICD-10-CM | POA: Diagnosis not present

## 2015-05-01 DIAGNOSIS — Z79899 Other long term (current) drug therapy: Secondary | ICD-10-CM

## 2015-05-01 DIAGNOSIS — B0229 Other postherpetic nervous system involvement: Secondary | ICD-10-CM

## 2015-05-01 DIAGNOSIS — Z85850 Personal history of malignant neoplasm of thyroid: Secondary | ICD-10-CM | POA: Diagnosis not present

## 2015-05-01 DIAGNOSIS — Z0000 Encounter for general adult medical examination without abnormal findings: Secondary | ICD-10-CM

## 2015-05-01 DIAGNOSIS — Z9049 Acquired absence of other specified parts of digestive tract: Secondary | ICD-10-CM

## 2015-05-01 DIAGNOSIS — Z87891 Personal history of nicotine dependence: Secondary | ICD-10-CM | POA: Insufficient documentation

## 2015-05-01 LAB — CBC WITH DIFFERENTIAL/PLATELET
Basophils Absolute: 0.1 10*3/uL (ref 0–0.1)
Basophils Relative: 1 %
EOS ABS: 0.1 10*3/uL (ref 0–0.7)
Eosinophils Relative: 1 %
HCT: 44.1 % (ref 35.0–47.0)
HEMOGLOBIN: 14.3 g/dL (ref 12.0–16.0)
LYMPHS ABS: 11.1 10*3/uL — AB (ref 1.0–3.6)
Lymphocytes Relative: 71 %
MCH: 27.8 pg (ref 26.0–34.0)
MCHC: 32.4 g/dL (ref 32.0–36.0)
MCV: 85.7 fL (ref 80.0–100.0)
Monocytes Absolute: 0.3 10*3/uL (ref 0.2–0.9)
Monocytes Relative: 2 %
NEUTROS ABS: 3.9 10*3/uL (ref 1.4–6.5)
Platelets: 177 10*3/uL (ref 150–440)
RBC: 5.14 MIL/uL (ref 3.80–5.20)
RDW: 14.2 % (ref 11.5–14.5)
WBC: 15.6 10*3/uL — ABNORMAL HIGH (ref 3.6–11.0)

## 2015-05-01 LAB — COMPREHENSIVE METABOLIC PANEL
ALT: 12 U/L — ABNORMAL LOW (ref 14–54)
AST: 17 U/L (ref 15–41)
Albumin: 4.2 g/dL (ref 3.5–5.0)
Alkaline Phosphatase: 70 U/L (ref 38–126)
Anion gap: 7 (ref 5–15)
BUN: 15 mg/dL (ref 6–20)
CO2: 31 mmol/L (ref 22–32)
Calcium: 9.1 mg/dL (ref 8.9–10.3)
Chloride: 99 mmol/L — ABNORMAL LOW (ref 101–111)
Creatinine, Ser: 0.61 mg/dL (ref 0.44–1.00)
GFR calc Af Amer: >60 mL/min (ref >60)
Glucose, Bld: 109 mg/dL — ABNORMAL HIGH (ref 65–99)
Potassium: 4.1 mmol/L (ref 3.5–5.1)
SODIUM: 137 mmol/L (ref 135–145)
TOTAL PROTEIN: 7.5 g/dL (ref 6.5–8.1)
Total Bilirubin: 0.6 mg/dL (ref 0.3–1.2)

## 2015-05-01 LAB — LACTATE DEHYDROGENASE: LDH: 121 U/L (ref 98–192)

## 2015-05-03 ENCOUNTER — Encounter: Payer: PRIVATE HEALTH INSURANCE | Admitting: Nurse Practitioner

## 2015-05-03 DIAGNOSIS — M84350D Stress fracture, pelvis, subsequent encounter for fracture with routine healing: Secondary | ICD-10-CM | POA: Diagnosis not present

## 2015-05-08 ENCOUNTER — Encounter: Payer: Self-pay | Admitting: Nurse Practitioner

## 2015-05-08 ENCOUNTER — Ambulatory Visit (INDEPENDENT_AMBULATORY_CARE_PROVIDER_SITE_OTHER): Payer: Medicare Other | Admitting: Nurse Practitioner

## 2015-05-08 VITALS — BP 110/80 | HR 60 | Temp 98.0°F | Resp 14 | Ht 61.75 in | Wt 163.5 lb

## 2015-05-08 DIAGNOSIS — Z0000 Encounter for general adult medical examination without abnormal findings: Secondary | ICD-10-CM | POA: Diagnosis not present

## 2015-05-08 DIAGNOSIS — Z131 Encounter for screening for diabetes mellitus: Secondary | ICD-10-CM | POA: Diagnosis not present

## 2015-05-08 DIAGNOSIS — E890 Postprocedural hypothyroidism: Secondary | ICD-10-CM

## 2015-05-08 DIAGNOSIS — E785 Hyperlipidemia, unspecified: Secondary | ICD-10-CM | POA: Diagnosis not present

## 2015-05-08 MED ORDER — GABAPENTIN 300 MG PO CAPS: 300.0000 mg | ORAL_CAPSULE | Freq: Two times a day (BID) | ORAL | Status: DC

## 2015-05-08 MED ORDER — LEVOTHYROXINE SODIUM 175 MCG PO TABS: 175.0000 ug | ORAL_TABLET | Freq: Every day | ORAL | Status: DC

## 2015-05-08 NOTE — Progress Notes (Signed)
Subjective:    Patient ID: Lisa Figueroa, female    DOB: 1932-03-17, 81 y.o.   MRN: [id number]  HPI  Lisa Figueroa is an 81 yo female here for her annual wellness exam.   Health Screenings  Mammogram- May 23rd scheduled Pap Smear- N/A Colonoscopy- N/A Bone Density- Pt refuses  Glaucoma- Eye exam this month Hearing- Bilateral hearing aids Hemoglobin A1C- Needs updated Cholesterol- Needs updated  Social  Alcohol intake- Wine 1 glass most days  Smoking history- Former smoker 1985  Smokers in home- None Illicit drug use- N/A  Exercise- No formal  Diet- Eats at home most of the time  Sexually Active- N/A Multiple Partners- N/A  Safety  Patient feesl safe at home- Yes Patient does have smoke detectors at home- Yes Patient does wear sunscreen or protective clothing when in direct sunlight- No sunscreen  Patient does wear seat belt when driving or riding with others- Yes  Activities of Daily Living Patient can do their own household chores. Denies needing assistance with: driving, feeding themselves, getting from bed to chair, getting to the toilet, bathing/showering, dressing, managing money, climbing flight of stairs, or preparing meals.   Depression Screen Patient denies losing interest in daily life, feeling hopeless, or crying easily over simple problems.  Fall Screen Patient has fallen in the last year.  Does not recall what happened, reports not losing consciousness, broke left wrist, and pelvis. Went to ER, Dr. Rudene Christians was ortho Dr. Roma Kayser PT and doing well.   Memory Screen Patient denies problems with memory, misplacing items, and is able to balance checkbook/bank accounts.  Patient is alert, normal appearance, oriented to person/place/and time. Correctly identified the president of the [place], recall of 3 objects, and performing simple calculations.  Patient displays appropriate judgement and can read correct time from watch face.   Immunizations The following  Immunizations are up to date: Influenza, pneumonia, and tetanus.   Other Providers Patient Care Team: [name], NP as PCP - General (Nurse Practitioner) [name], MD as Consulting Physician (General Surgery)  Dr. Linton Flemings at [facility] on [address], [place] Dr. Rudene Christians- Orthopedics   Review of Systems No ROS Annual Wellness visit     Objective:   Physical Exam BP 110/80 mmHg  Pulse 60  Temp(Src) 98 F (36.7 C) (Oral)  Resp 14  Ht 5' 1.75" (1.568 m)  Wt 163 lb 8 oz (74.163 kg)  BMI 30.16 kg/m2  SpO2 95% No physical exam Annual Wellness visit     Assessment & Plan:  This is a routine wellness examination for this patient.  I reviewed all health maintenance protocols for the next 5-10 years including bone density.  Needed referrals placed. Age and diagnosis appropriate screening labs were ordered. Her immunization history was reviewed and up to date.   Her current medications and allergies were reviewed and needed refills of her chronic medications were ordered if needed.  The plan for yearly health maintenance was discussed.     MEDICARE ATTESTATION I have personally reviewed:  The patient's medical and social history. The use of alcohol, tobacco and illicit drugs. The current medications and supplements. The patient's function ability including ADLs, fall risks, home safety risks, cognitive, and hearing and visual impairment.   Diet and physical activities. Evaluation for depression and mood disorders.    The patient's weight, height, BMI and visual acuity have been recorded in the chart. I have made referrals, counseled, and provided education  to the patient based on review of the above.

## 2015-05-08 NOTE — Progress Notes (Signed)
Pre visit review using our clinic review tool, if applicable. No additional management support is needed unless otherwise documented below in the visit note. 

## 2015-05-08 NOTE — Patient Instructions (Signed)

## 2015-05-12 ENCOUNTER — Encounter: Payer: Self-pay | Admitting: Oncology

## 2015-05-12 NOTE — Progress Notes (Signed)
[facility] @ [facility] [phone]  Fax:([phone]) [place] OB: [birth year]  MR#: [id number]  QMV#:[id number]  Patient Care Team: [name], NP as PCP - General (Nurse Practitioner) [name], MD as Consulting Physician (General Surgery)  CHIEF COMPLAINT:  Chief Complaint  Patient presents with  . Follow-up  . Leukemia    Oncology History    1. Monoclonal population of lymphocytes.  Lymphoproliferative disease. 81 year old lady who had an abnormal mammogram mid axillary lymphadenopathy core biopsies which he suggested a lymphoproliferative disease positive staining for CD5 and CD20.     CLL (chronic lymphocytic leukemia)   11/18/2014 Initial Diagnosis CLL (chronic lymphocytic leukemia)    No flowsheet data found.  INTERVAL HISTORY: 81 year old lady came today further follow-up regarding chronic lymphocytic leukemia.  Patient continues to a postherpetic neuralgia.  Palpable lymphadenopathy persists.  Here for further follow-up and treatment consideration.  No chills.  No fever.  Has not lost any weight.  REVIEW OF SYSTEMS:   GENERAL:  Feels good.  Active.  No fevers, sweats or weight loss. PERFORMANCE STATUS (ECOG):  01 HEENT:  No visual changes, runny nose, sore throat, mouth sores or tenderness. Lungs: No shortness of breath or cough.  No hemoptysis. Cardiac:  No chest pain, palpitations, orthopnea, or PND. GI:  No nausea, vomiting, diarrhea, constipation, melena or hematochezia. GU:  No urgency, frequency, dysuria, or hematuria. Musculoskeletal:  No back pain.  No joint pain.  No muscle tenderness. Extremities:  No pain or swelling. Skin:  No rashes or skin changes. Neuro:  No headache, numbness or weakness, balance or coordination issues. Endocrine:  No diabetes, thyroid issues, hot flashes or night sweats. Psych:  No mood changes, depression or anxiety. Pain:  No focal pain. Review of systems:  All other systems reviewed and  found to be negative.  As per HPI. Otherwise, a complete review of systems is negatve.  PAST MEDICAL HISTORY: Past Medical History  Diagnosis Date  . Thyroid cancer 1998  . History of colon polyps   . History of shingles   . Post herpetic neuralgia   . AAA (abdominal aortic aneurysm)   . Hypertension   . Hyperlipidemia   . Thyroid cancer   . CLL (chronic lymphocytic leukemia)     PAST SURGICAL HISTORY: Past Surgical History  Procedure Laterality Date  . Cholecystectomy    . Abdominal hysterectomy    . Hemorrhoid surgery    . Total thyroidectomy  1998  . Appendectomy    . Breast surgery Right     cyst excision  . Cardiac catheterization  05/18/2008    At [facility]: mild 30% stenosis in LCX. normal EF.      FAMILY HISTORY Family History  Problem Relation Age of Onset  . Cancer Brother     prostate and bone  . Cancer Maternal Aunt     breast  . Cancer Cousin     breast - maternal side  . Heart disease Mother   . Heart disease Father    Significant History/PMH:   thyroid cancer:    core biopsy lymph node axilla:    Appendectomy:    Hemorrhoidectomy:    Hysterectomy:    Cholecystectomy:    Thyroidectomy:   Smoking History: Smoking History Never Smoked.  PFSH: Comments: aunt  had a breast cancer  Comments: Does not smoke.  Does not drink.  Additional Past Medical and Surgical History: As mentioned above   GYNECOLOGIC HISTORY:  No  LMP recorded. Patient has had a hysterectomy.     ADVANCED DIRECTIVES:    HEALTH MAINTENANCE: History  Substance Use Topics  . Smoking status: Former Smoker -- 1.00 packs/day for 20 years    Quit date: 08/08/1984  . Smokeless tobacco: Not on file  . Alcohol Use: 0.0 oz/week    0 Standard drinks or equivalent per week     Comment: wine glass daily     Colonoscopy:  PAP:  Bone density:  Lipid panel:  Allergies  Allergen Reactions  . Penicillin G Swelling  . Prednisone Other (See Comments)    crys and can't  sleep crys and can't sleep    Current Outpatient Prescriptions  Medication Sig Dispense Refill  . acetaminophen (TYLENOL) 500 MG tablet Take 500 mg by mouth daily.    . Cholecalciferol (HM VITAMIN D3) 2000 UNITS CAPS Take 1 capsule by mouth daily.    Marland Kitchen losartan (COZAAR) 100 MG tablet TAKE ONE (1) TABLET EACH DAY 30 tablet 5  . simvastatin (ZOCOR) 20 MG tablet TAKE ONE (1) TABLET EACH DAY 30 tablet 5  . gabapentin (NEURONTIN) 300 MG capsule Take 1 capsule (300 mg total) by mouth 2 (two) times daily. 180 capsule 1  . levothyroxine (SYNTHROID, LEVOTHROID) 175 MCG tablet Take 1 tablet (175 mcg total) by mouth daily. 90 tablet 0   No current facility-administered medications for this visit.    OBJECTIVE:  Filed Vitals:   05/01/15 1151  BP: 124/91  Pulse: 76  Temp: 95.3 F (35.2 C)  Resp: 8     Body mass index is 30.63 kg/(m^2).    ECOG FS:1 - Symptomatic but completely ambulatory  PHYSICAL EXAM: GENERAL:  Well developed, well nourished, sitting comfortably in the exam room in no acute distress. MENTAL STATUS:  Alert and oriented to person, place and time. HEAD:  Normocephalic, atraumatic, face symmetric, no Cushingoid features. EYES:   Pupils equal round and reactive to light and accomodation.  No conjunctivitis or scleral icterus. ENT:  Oropharynx clear without lesion.  Tongue normal. Mucous membranes moist.  RESPIRATORY:  Clear to auscultation without rales, wheezes or rhonchi. CARDIOVASCULAR:  Regular rate and rhythm without murmur, rub or gallop. BREAST:  Right breast without masses, skin changes or nipple discharge.  Left breast without masses, skin changes or nipple discharge. ABDOMEN:  Soft, non-tender, with active bowel sounds, and no hepatosplenomegaly.  No masses. BACK:  No CVA tenderness.  No tenderness on percussion of the back or rib cage. SKIN:  No rashes, ulcers or lesions. EXTREMITIES: No edema, no skin discoloration or tenderness.  No palpable cords. LYMPH NODES:  Multiple small palpable axillary lymph node on the both sides unchanged NEUROLOGICAL: Unremarkable. PSYCH:  Appropriate.   LAB RESULTS:  Appointment on 05/01/2015  Component Date Value Ref Range Status  . WBC 05/01/2015 15.6* 3.6 - 11.0 K/uL Final  . RBC 05/01/2015 5.14  3.80 - 5.20 MIL/uL Final  . Hemoglobin 05/01/2015 14.3  12.0 - 16.0 g/dL Final  . HCT 05/01/2015 44.1  35.0 - 47.0 % Final  . MCV 05/01/2015 85.7  80.0 - 100.0 fL Final  . MCH 05/01/2015 27.8  26.0 - 34.0 pg Final  . MCHC 05/01/2015 32.4  32.0 - 36.0 g/dL Final  . RDW 05/01/2015 14.2  11.5 - 14.5 % Final  . Platelets 05/01/2015 177  150 - 440 K/uL Final  . Neutrophils Relative % 05/01/2015 25%   Final  . Neutro Abs 05/01/2015 3.9  1.4 - 6.5 K/uL Final  .  Lymphocytes Relative 05/01/2015 71%   Final  . Lymphs Abs 05/01/2015 11.1* 1.0 - 3.6 K/uL Final  . Monocytes Relative 05/01/2015 2%   Final  . Monocytes Absolute 05/01/2015 0.3  0.2 - 0.9 K/uL Final  . Eosinophils Relative 05/01/2015 1%   Final  . Eosinophils Absolute 05/01/2015 0.1  0 - 0.7 K/uL Final  . Basophils Relative 05/01/2015 1%   Final  . Basophils Absolute 05/01/2015 0.1  0 - 0.1 K/uL Final  . Sodium 05/01/2015 137  135 - 145 mmol/L Final  . Potassium 05/01/2015 4.1  3.5 - 5.1 mmol/L Final  . Chloride 05/01/2015 99* 101 - 111 mmol/L Final  . CO2 05/01/2015 31  22 - 32 mmol/L Final  . Glucose, Bld 05/01/2015 109* 65 - 99 mg/dL Final  . BUN 05/01/2015 15  6 - 20 mg/dL Final  . Creatinine, Ser 05/01/2015 0.61  0.44 - 1.00 mg/dL Final  . Calcium 05/01/2015 9.1  8.9 - 10.3 mg/dL Final  . Total Protein 05/01/2015 7.5  6.5 - 8.1 g/dL Final  . Albumin 05/01/2015 4.2  3.5 - 5.0 g/dL Final  . AST 05/01/2015 17  15 - 41 U/L Final  . ALT 05/01/2015 12* 14 - 54 U/L Final  . Alkaline Phosphatase 05/01/2015 70  38 - 126 U/L Final  . Total Bilirubin 05/01/2015 0.6  0.3 - 1.2 mg/dL Final  . GFR calc non Af Amer 05/01/2015 >60  >60 mL/min Final  . GFR calc Af Amer  05/01/2015 >60  >60 mL/min Final   Comment: (NOTE) The eGFR has been calculated using the CKD EPI equation. This calculation has not been validated in all clinical situations. eGFR's persistently <90 mL/min signify possible Chronic Kidney Disease.   . Anion gap 05/01/2015 7  5 - 15 Final  . LDH 05/01/2015 121  98 - 192 U/L Final    No results found for: LABCA2 No results found for: CA199 No results found for: CEA No results found for: PSA No results found for: CA125   STUDIES: No results found.  ASSESSMENT: Monoclonal population of lymphocytes and palpable lymph node suggestive of chronic lymphocytic leukemia.  Hemoglobin white count stable  MEDICAL DECISION MAKING:  All lab data has been reviewed.  We are still dealing with stage 0 chronic lymphocytic leukemia and no further intervention has been planned.  Continue observation has been discussed with the patient and she is in agreement with it  Patient expressed understanding and was in agreement with this plan. She also understands that She can call clinic at any time with any questions, concerns, or complaints.    No matching staging information was found for the patient.  Forest Gleason, MD   05/12/2015 6:35 PM

## 2015-05-14 ENCOUNTER — Other Ambulatory Visit: Payer: Self-pay | Admitting: Nurse Practitioner

## 2015-05-14 ENCOUNTER — Telehealth: Payer: Self-pay | Admitting: *Deleted

## 2015-05-14 ENCOUNTER — Other Ambulatory Visit (INDEPENDENT_AMBULATORY_CARE_PROVIDER_SITE_OTHER): Payer: Medicare Other

## 2015-05-14 DIAGNOSIS — E785 Hyperlipidemia, unspecified: Secondary | ICD-10-CM

## 2015-05-14 DIAGNOSIS — Z131 Encounter for screening for diabetes mellitus: Secondary | ICD-10-CM | POA: Diagnosis not present

## 2015-05-14 DIAGNOSIS — E559 Vitamin D deficiency, unspecified: Secondary | ICD-10-CM

## 2015-05-14 DIAGNOSIS — E890 Postprocedural hypothyroidism: Secondary | ICD-10-CM | POA: Diagnosis not present

## 2015-05-14 DIAGNOSIS — C9110 Chronic lymphocytic leukemia of B-cell type not having achieved remission: Secondary | ICD-10-CM

## 2015-05-14 LAB — COMPREHENSIVE METABOLIC PANEL
ALBUMIN: 4.4 g/dL (ref 3.5–5.2)
ALK PHOS: 74 U/L (ref 39–117)
ALT: 9 U/L (ref 0–35)
AST: 16 U/L (ref 0–37)
BUN: 12 mg/dL (ref 6–23)
CHLORIDE: 98 meq/L (ref 96–112)
CO2: 35 mEq/L — ABNORMAL HIGH (ref 19–32)
Calcium: 9.4 mg/dL (ref 8.4–10.5)
Creatinine, Ser: 0.64 mg/dL (ref 0.40–1.20)
GFR: 94.74 mL/min (ref >60.00)
GLUCOSE: 80 mg/dL (ref 70–99)
POTASSIUM: 4.3 meq/L (ref 3.5–5.1)
Sodium: 135 mEq/L (ref 135–145)
TOTAL PROTEIN: 7.1 g/dL (ref 6.0–8.3)
Total Bilirubin: 0.6 mg/dL (ref 0.2–1.2)

## 2015-05-14 LAB — CBC WITH DIFFERENTIAL/PLATELET
Basophils Absolute: 0.1 10*3/uL (ref 0.0–0.1)
Basophils Relative: 0.3 % (ref 0.0–3.0)
EOS PCT: 0.9 % (ref 0.0–5.0)
Eosinophils Absolute: 0.2 10*3/uL (ref 0.0–0.7)
HCT: 44.8 % (ref 36.0–46.0)
Hemoglobin: 14.8 g/dL (ref 12.0–15.0)
Lymphocytes Relative: 75.7 % — ABNORMAL HIGH (ref 12.0–46.0)
Lymphs Abs: 14.8 10*3/uL — ABNORMAL HIGH (ref 0.7–4.0)
MCHC: 32.9 g/dL (ref 30.0–36.0)
MCV: 84.7 fl (ref 78.0–100.0)
MONO ABS: 0.3 10*3/uL (ref 0.1–1.0)
MONOS PCT: 1.6 % — AB (ref 3.0–12.0)
NEUTROS PCT: 21.5 % — AB (ref 43.0–77.0)
Neutro Abs: 4.2 10*3/uL (ref 1.4–7.7)
PLATELETS: 189 10*3/uL (ref 150.0–400.0)
RBC: 5.29 Mil/uL — ABNORMAL HIGH (ref 3.87–5.11)
RDW: 14.5 % (ref 11.5–15.5)

## 2015-05-14 LAB — LIPID PANEL
CHOLESTEROL: 171 mg/dL (ref 0–200)
HDL: 52 mg/dL (ref >39.00)
LDL Cholesterol: 99 mg/dL (ref 0–99)
NONHDL: 119
Total CHOL/HDL Ratio: 3
Triglycerides: 100 mg/dL (ref 0.0–149.0)
VLDL: 20 mg/dL (ref 0.0–40.0)

## 2015-05-14 LAB — VITAMIN D 25 HYDROXY (VIT D DEFICIENCY, FRACTURES): VITD: 40.95 ng/mL (ref 30.00–100.00)

## 2015-05-14 LAB — HEMOGLOBIN A1C: HEMOGLOBIN A1C: 5.2 % (ref 4.6–6.5)

## 2015-05-14 NOTE — Telephone Encounter (Signed)
Patient states she is feeling fine, but will call back if there is any change.  Lab appointment made for 05/20/15.

## 2015-05-14 NOTE — Telephone Encounter (Signed)
Pt has CLL and CBC has worsened since her last visit with Dr. Jeb Levering. Will forward results when they are sent in the system. Will repeat next week.

## 2015-05-14 NOTE — Telephone Encounter (Signed)
CRITICAL  WBC: 19.5

## 2015-05-14 NOTE — Telephone Encounter (Signed)
Please call patient and see how she is. She had a very high WBC count, which could mean infection. Is she having any fever, cough, skin infections? If so she will need to be seen ASAP and started on antibiotics.

## 2015-05-15 ENCOUNTER — Telehealth: Payer: Self-pay

## 2015-05-15 ENCOUNTER — Telehealth: Payer: Self-pay | Admitting: Nurse Practitioner

## 2015-05-15 ENCOUNTER — Other Ambulatory Visit: Payer: Self-pay | Admitting: Nurse Practitioner

## 2015-05-15 LAB — T4 AND TSH
T4, Total: 11.2 ug/dL (ref 4.5–12.0)
TSH: 0.058 u[IU]/mL — AB (ref 0.450–4.500)

## 2015-05-15 NOTE — Telephone Encounter (Signed)
See result not on 05/15/15 for further information.

## 2015-05-15 NOTE — Progress Notes (Signed)
Called patient, she verbalized understanding

## 2015-05-15 NOTE — Telephone Encounter (Signed)
Pharmacy called and stated and ABX was to called in for patient that patient saw NP on 05/14/15 and was advised she has a UTI. Please advise.

## 2015-05-15 NOTE — Telephone Encounter (Signed)
Lisa Figueroa is working on this since she called multiple lines.

## 2015-05-15 NOTE — Progress Notes (Signed)
Spoke with the patient on the phone.  Verbalized understanding that she didn't need an antibiotic at this time.

## 2015-05-15 NOTE — Telephone Encounter (Signed)
The patient called and stated she was under the impression she was suppose to have an antibiotic called in due to a urine specimen she gave yesterday.  She states she has called the pharmacy, and no medication was called in yesterday.

## 2015-05-20 ENCOUNTER — Other Ambulatory Visit: Payer: Medicare Other

## 2015-05-20 ENCOUNTER — Telehealth: Payer: Self-pay | Admitting: Nurse Practitioner

## 2015-05-20 DIAGNOSIS — Z1231 Encounter for screening mammogram for malignant neoplasm of breast: Secondary | ICD-10-CM | POA: Diagnosis not present

## 2015-05-20 NOTE — Telephone Encounter (Signed)
Pt returned call.  Spoke with Morey Hummingbird to verify whether she needed to follow up with pt on lab results.  Morey Hummingbird said no as we faxed results to Dr Jeb Levering for his review as her Oncologist.

## 2015-05-22 DIAGNOSIS — Z0000 Encounter for general adult medical examination without abnormal findings: Secondary | ICD-10-CM | POA: Insufficient documentation

## 2015-05-22 DIAGNOSIS — H2512 Age-related nuclear cataract, left eye: Secondary | ICD-10-CM | POA: Diagnosis not present

## 2015-05-22 LAB — HM MAMMOGRAPHY: HM Mammogram: NORMAL

## 2015-05-22 NOTE — Assessment & Plan Note (Signed)
AWV subsequent today. Will obtain updated Lipid panel and A1c. Pt does not wish to have bone density scan, eye exam scheduled. Yearly health maintenance given in AVS and discussed with patient.

## 2015-05-24 DIAGNOSIS — R590 Localized enlarged lymph nodes: Secondary | ICD-10-CM | POA: Diagnosis not present

## 2015-05-24 DIAGNOSIS — R928 Other abnormal and inconclusive findings on diagnostic imaging of breast: Secondary | ICD-10-CM | POA: Diagnosis not present

## 2015-05-28 ENCOUNTER — Encounter
Admission: RE | Admit: 2015-05-28 | Discharge: 2015-05-28 | Disposition: A | Discharge status: Home / Self Care | Payer: Medicare Other | Source: Ambulatory Visit | Attending: Ophthalmology | Admitting: Ophthalmology

## 2015-05-28 DIAGNOSIS — I10 Essential (primary) hypertension: Secondary | ICD-10-CM | POA: Diagnosis not present

## 2015-05-28 DIAGNOSIS — Z01810 Encounter for preprocedural cardiovascular examination: Secondary | ICD-10-CM | POA: Diagnosis not present

## 2015-05-28 DIAGNOSIS — H2512 Age-related nuclear cataract, left eye: Secondary | ICD-10-CM | POA: Diagnosis not present

## 2015-05-28 DIAGNOSIS — Z01812 Encounter for preprocedural laboratory examination: Secondary | ICD-10-CM | POA: Diagnosis not present

## 2015-05-28 LAB — POTASSIUM: Potassium: 4.5 mmol/L (ref 3.5–5.1)

## 2015-06-04 ENCOUNTER — Encounter
Admission: RE | Disposition: A | Discharge status: Home / Self Care | Payer: Self-pay | Source: Ambulatory Visit | Attending: Ophthalmology

## 2015-06-04 ENCOUNTER — Encounter: Payer: Self-pay | Admitting: *Deleted

## 2015-06-04 ENCOUNTER — Ambulatory Visit: Payer: Medicare Other | Admitting: Anesthesiology

## 2015-06-04 ENCOUNTER — Ambulatory Visit
Admission: RE | Admit: 2015-06-04 | Discharge: 2015-06-04 | Disposition: A | Discharge status: Home / Self Care | Payer: Medicare Other | Source: Ambulatory Visit | Attending: Ophthalmology | Admitting: Ophthalmology

## 2015-06-04 DIAGNOSIS — I10 Essential (primary) hypertension: Secondary | ICD-10-CM | POA: Insufficient documentation

## 2015-06-04 DIAGNOSIS — Z9049 Acquired absence of other specified parts of digestive tract: Secondary | ICD-10-CM | POA: Insufficient documentation

## 2015-06-04 DIAGNOSIS — Z8669 Personal history of other diseases of the nervous system and sense organs: Secondary | ICD-10-CM | POA: Insufficient documentation

## 2015-06-04 DIAGNOSIS — Z87891 Personal history of nicotine dependence: Secondary | ICD-10-CM | POA: Insufficient documentation

## 2015-06-04 DIAGNOSIS — H2512 Age-related nuclear cataract, left eye: Secondary | ICD-10-CM | POA: Insufficient documentation

## 2015-06-04 DIAGNOSIS — H9193 Unspecified hearing loss, bilateral: Secondary | ICD-10-CM | POA: Insufficient documentation

## 2015-06-04 DIAGNOSIS — Z85828 Personal history of other malignant neoplasm of skin: Secondary | ICD-10-CM | POA: Insufficient documentation

## 2015-06-04 DIAGNOSIS — Z79899 Other long term (current) drug therapy: Secondary | ICD-10-CM | POA: Insufficient documentation

## 2015-06-04 DIAGNOSIS — Z90710 Acquired absence of both cervix and uterus: Secondary | ICD-10-CM | POA: Diagnosis not present

## 2015-06-04 DIAGNOSIS — E780 Pure hypercholesterolemia: Secondary | ICD-10-CM | POA: Diagnosis not present

## 2015-06-04 DIAGNOSIS — E890 Postprocedural hypothyroidism: Secondary | ICD-10-CM | POA: Insufficient documentation

## 2015-06-04 DIAGNOSIS — C9110 Chronic lymphocytic leukemia of B-cell type not having achieved remission: Secondary | ICD-10-CM | POA: Diagnosis not present

## 2015-06-04 DIAGNOSIS — I739 Peripheral vascular disease, unspecified: Secondary | ICD-10-CM | POA: Insufficient documentation

## 2015-06-04 DIAGNOSIS — Z888 Allergy status to other drugs, medicaments and biological substances status: Secondary | ICD-10-CM | POA: Insufficient documentation

## 2015-06-04 DIAGNOSIS — E039 Hypothyroidism, unspecified: Secondary | ICD-10-CM | POA: Diagnosis not present

## 2015-06-04 HISTORY — PX: CATARACT EXTRACTION W/PHACO: SHX586

## 2015-06-04 SURGERY — PHACOEMULSIFICATION, CATARACT, WITH IOL INSERTION
Anesthesia: Monitor Anesthesia Care | Site: Eye | Laterality: Left | Wound class: Clean

## 2015-06-04 MED ORDER — MIDAZOLAM HCL 2 MG/2ML IJ SOLN
INTRAMUSCULAR | Status: DC | PRN
  Administered 2015-06-04: 1 mg via INTRAVENOUS

## 2015-06-04 MED ORDER — TETRACAINE HCL 0.5 % OP SOLN
OPHTHALMIC | Status: AC
  Administered 2015-06-04: 1 [drp] via OPHTHALMIC
  Filled 2015-06-04: qty 2

## 2015-06-04 MED ORDER — CARBACHOL 0.01 % IO SOLN
INTRAOCULAR | Status: DC | PRN
  Administered 2015-06-04: 0.5 mL via INTRAOCULAR

## 2015-06-04 MED ORDER — POVIDONE-IODINE 5 % OP SOLN
OPHTHALMIC | Status: AC
  Filled 2015-06-04: qty 30

## 2015-06-04 MED ORDER — MOXIFLOXACIN HCL 0.5 % OP SOLN - NO CHARGE
OPHTHALMIC | Status: DC | PRN
  Administered 2015-06-04: 1 [drp] via OPHTHALMIC

## 2015-06-04 MED ORDER — EPINEPHRINE HCL 1 MG/ML IJ SOLN
INTRAMUSCULAR | Status: AC
  Filled 2015-06-04: qty 1

## 2015-06-04 MED ORDER — EPINEPHRINE HCL 1 MG/ML IJ SOLN
INTRAOCULAR | Status: DC | PRN
  Administered 2015-06-04: 200 mL

## 2015-06-04 MED ORDER — MOXIFLOXACIN HCL 0.5 % OP SOLN
OPHTHALMIC | Status: AC
  Filled 2015-06-04: qty 3

## 2015-06-04 MED ORDER — CEFUROXIME OPHTHALMIC INJECTION 1 MG/0.1 ML
INJECTION | OPHTHALMIC | Status: AC
  Filled 2015-06-04: qty 0.1

## 2015-06-04 MED ORDER — SODIUM CHLORIDE 0.9 % IV SOLN
INTRAVENOUS | Status: DC
  Administered 2015-06-04: 07:00:00 via INTRAVENOUS

## 2015-06-04 MED ORDER — ARMC OPHTHALMIC DILATING GEL
OPHTHALMIC | Status: AC
  Administered 2015-06-04: 1 via OPHTHALMIC
  Filled 2015-06-04: qty 0.25

## 2015-06-04 MED ORDER — POVIDONE-IODINE 5 % OP SOLN
1.0000 "application " | OPHTHALMIC | Status: AC | PRN
  Administered 2015-06-04: 07:00:00 via OPHTHALMIC

## 2015-06-04 MED ORDER — ARMC OPHTHALMIC DILATING GEL
1.0000 "application " | OPHTHALMIC | Status: AC | PRN
  Administered 2015-06-04 (×2): 1 via OPHTHALMIC

## 2015-06-04 MED ORDER — NA CHONDROIT SULF-NA HYALURON 40-17 MG/ML IO SOLN
INTRAOCULAR | Status: AC
  Filled 2015-06-04: qty 1

## 2015-06-04 MED ORDER — TETRACAINE HCL 0.5 % OP SOLN
1.0000 [drp] | OPHTHALMIC | Status: AC
  Administered 2015-06-04: 1 [drp] via OPHTHALMIC

## 2015-06-04 SURGICAL SUPPLY — 22 items
ACTIVE FMS ×3 IMPLANT
CANNULA ANT/CHMB 27GA (MISCELLANEOUS) ×3 IMPLANT
GLOVE BIO SURGEON STRL SZ8 (GLOVE) ×3 IMPLANT
GLOVE BIOGEL M 6.5 STRL (GLOVE) ×3 IMPLANT
GLOVE SURG LX 8.0 MICRO (GLOVE) ×2
GLOVE SURG LX STRL 8.0 MICRO (GLOVE) ×1 IMPLANT
GOWN STRL REUS W/ TWL LRG LVL3 (GOWN DISPOSABLE) ×2 IMPLANT
GOWN STRL REUS W/TWL LRG LVL3 (GOWN DISPOSABLE) ×4
LENS IOL TECNIS 21 (Intraocular Lens) ×1 IMPLANT
LENS IOL TECNIS 21.0 (Intraocular Lens) ×2 IMPLANT
LENS IOL TECNIS MONO 1P 21.0 (Intraocular Lens) ×1 IMPLANT
PACK CATARACT (MISCELLANEOUS) ×3 IMPLANT
PACK CATARACT BRASINGTON LX (MISCELLANEOUS) ×3 IMPLANT
PACK EYE AFTER SURG (MISCELLANEOUS) ×3 IMPLANT
SOL BSS BAG (MISCELLANEOUS) ×3
SOL PREP PVP 2OZ (MISCELLANEOUS) ×3
SOLUTION BSS BAG (MISCELLANEOUS) ×1 IMPLANT
SOLUTION PREP PVP 2OZ (MISCELLANEOUS) ×1 IMPLANT
SYR 5ML LL (SYRINGE) ×3 IMPLANT
SYR TB 1ML 27GX1/2 LL (SYRINGE) ×3 IMPLANT
WATER STERILE IRR 1000ML POUR (IV SOLUTION) ×3 IMPLANT
WIPE NON LINTING 3.25X3.25 (MISCELLANEOUS) ×3 IMPLANT

## 2015-06-04 NOTE — Anesthesia Postprocedure Evaluation (Signed)
  Anesthesia Post-op Note  Patient: Lisa Figueroa  Procedure(s) Performed: Procedure(s) with comments: CATARACT EXTRACTION PHACO AND INTRAOCULAR LENS PLACEMENT (IOC) (Left) - [place] 00:34.1 AP% 23.0 CDE 7.85  Anesthesia type:MAC  Patient location: PACU  Post pain: Pain level controlled  Post assessment: Post-op Vital signs reviewed, Patient's Cardiovascular Status Stable, Respiratory Function Stable, Patent Airway and No signs of Nausea or vomiting  Post vital signs: Reviewed and stable  Last Vitals:  Filed Vitals:   06/04/15 0849  BP: 180/86  Pulse: 74  Temp: 36.1 C  Resp: 12    Level of consciousness: awake, alert  and patient cooperative  Complications: No apparent anesthesia complications

## 2015-06-04 NOTE — Transfer of Care (Signed)
Immediate Anesthesia Transfer of Care Note  Patient: Lisa Figueroa  Procedure(s) Performed: Procedure(s) with comments: CATARACT EXTRACTION PHACO AND INTRAOCULAR LENS PLACEMENT (IOC) (Left) - [place] 00:34.1 AP% 23.0 CDE 7.85  Patient Location: PACU  Anesthesia Type:MAC  Level of Consciousness: awake, alert  and oriented  Airway & Oxygen Therapy: Patient Spontanous Breathing  Post-op Assessment: Report given to RN and Post -op Vital signs reviewed and stable  Post vital signs: stable  Last Vitals:  Filed Vitals:   06/04/15 0849  BP: 180/86  Pulse: 74  Temp: 36.1 C  Resp: 12    Complications: No apparent anesthesia complications

## 2015-06-04 NOTE — Discharge Instructions (Signed)
See cataract post op handout  Eye Surgery Discharge Instructions  Expect mild scratchy sensation or mild soreness. DO NOT RUB YOUR EYE!  The day of surgery:  Minimal physical activity, but bed rest is not required  No reading, computer work, or close hand work  No bending, lifting, or straining.  May watch TV  For 24 hours:  No driving, legal decisions, or alcoholic beverages  Safety precautions  Eat anything you prefer: It is better to start with liquids, then soup then solid foods.  _____ Eye patch should be worn until postoperative exam tomorrow.  ____ Solar shield eyeglasses should be worn for comfort in the sunlight/patch while sleeping  Resume all regular medications including aspirin or Coumadin if these were discontinued prior to surgery. You may shower, bathe, shave, or wash your hair. Tylenol may be taken for mild discomfort.  Call your doctor if you experience significant pain, nausea, or vomiting, fever > 101 or other signs of infection. [phone] or [phone] Specific instructions:  Follow-up Information    Follow up with Tim Lair, MD.   Specialty:  Ophthalmology   Why:  [place] 8 at 10:40   Contact information:   [address] [place] [address] [phone]     AMBULATORY SURGERY  DISCHARGE INSTRUCTIONS   1) The drugs that you were given will stay in your system until tomorrow so for the next 24 hours you should not:  A) Drive an automobile B) Make any legal decisions C) Drink any alcoholic beverage   2) You may resume regular meals tomorrow.  Today it is better to start with liquids and gradually work up to solid foods.  You may eat anything you prefer, but it is better to start with liquids, then soup and crackers, and gradually work up to solid foods.   3) Please notify your doctor immediately if you have any unusual bleeding, trouble breathing, redness and pain at the surgery site, drainage, fever, or pain not  relieved by medication. 4)   5) Your post-operative visit with Dr.    George Ina                                 is: Date:                        Time:    Please call to schedule your post-operative visit.  6) Additional Instructions:

## 2015-06-04 NOTE — Anesthesia Preprocedure Evaluation (Addendum)
Anesthesia Evaluation  Patient identified by MRN, date of birth, ID band Patient awake    Reviewed: Allergy & Precautions, NPO status , Patient's Chart, lab work & pertinent test results  History of Anesthesia Complications Negative for: history of anesthetic complications  Airway Mallampati: III  TM Distance: >3 FB Neck ROM: Full    Dental no notable dental hx.    Pulmonary neg pulmonary ROS, former smoker,  breath sounds clear to auscultation  Pulmonary exam normal       Cardiovascular hypertension, Pt. on medications + Peripheral Vascular Disease Normal cardiovascular examRhythm:Regular Rate:Normal  -AAA   Neuro/Psych negative neurological ROS  negative psych ROS   GI/Hepatic negative GI ROS, Neg liver ROS,   Endo/Other  Hypothyroidism   Renal/GU negative Renal ROS  negative genitourinary   Musculoskeletal negative musculoskeletal ROS (+)   Abdominal   Peds negative pediatric ROS (+)  Hematology negative hematology ROS (+) CLL   Anesthesia Other Findings   Reproductive/Obstetrics negative OB ROS                            Anesthesia Physical Anesthesia Plan  ASA: III  Anesthesia Plan: MAC   Post-op Pain Management:    Induction: Intravenous  Airway Management Planned: Nasal Cannula  Additional Equipment:   Intra-op Plan:   Post-operative Plan:   Informed Consent: I have reviewed the patients History and Physical, chart, labs and discussed the procedure including the risks, benefits and alternatives for the proposed anesthesia with the patient or authorized representative who has indicated his/her understanding and acceptance.   Dental advisory given  Plan Discussed with: CRNA and Surgeon  Anesthesia Plan Comments:         Anesthesia Quick Evaluation

## 2015-06-04 NOTE — Op Note (Signed)
PREOPERATIVE DIAGNOSIS:  Nuclear sclerotic cataract of the left eye.   POSTOPERATIVE DIAGNOSIS:  same   OPERATIVE PROCEDURE:  Procedure(s): CATARACT EXTRACTION PHACO AND INTRAOCULAR LENS PLACEMENT (IOC)   SURGEON:  Birder Robson, MD.   ANESTHESIA:   Anesthesiologist: Lorane Gell, MD CRNA: Aline Brochure, CRNA  1.      Managed anesthesia care. 2.      Topical tetracaine drops followed by 2% Xylocaine jelly applied in the preoperative holding area.   COMPLICATIONS:  None.   TECHNIQUE:   Stop and chop   DESCRIPTION OF PROCEDURE:  The patient was examined and consented in the preoperative holding area where the aforementioned topical anesthesia was applied to the left eye and then brought back to the Operating Room where the left eye was prepped and draped in the usual sterile ophthalmic fashion and a lid speculum was placed. A paracentesis was created with the side port blade and the anterior chamber was filled with viscoelastic. A near clear corneal incision was performed with the steel keratome. A continuous curvilinear capsulorrhexis was performed with a cystotome followed by the capsulorrhexis forceps. Hydrodissection and hydrodelineation were carried out with BSS on a blunt cannula. The lens was removed in a stop and chop  technique and the remaining cortical material was removed with the irrigation-aspiration handpiece. The capsular bag was inflated with viscoelastic and the Technis ZCB00 lens was placed in the capsular bag without complication. The remaining viscoelastic was removed from the eye with the irrigation-aspiration handpiece. The wounds were hydrated. The anterior chamber was flushed with Miostat and the eye was inflated to physiologic pressure. 0.1 mL of cefuroxime concentration 10 mg/mL was placed in the anterior chamber. The wounds were found to be water tight. The eye was dressed with Vigamox. The patient was given protective glasses to wear throughout the day and a  shield with which to sleep tonight. The patient was also given drops with which to begin a drop regimen today and will follow-up with me in one day.  Implant Name Type Inv. Item Serial No. Manufacturer Lot No. LRB No. Used  LENS IMPL INTRAOC ZCB00 21.0 - [id number] Intraocular Lens LENS IMPL INTRAOC ZCB00 21.0 [id number] AMO   Left 1   Procedure(s) with comments: CATARACT EXTRACTION PHACO AND INTRAOCULAR LENS PLACEMENT (IOC) (Left) - [place] 00:34.1 AP% 23.0 CDE 7.85  Electronically signed: [place] 06/04/2015 8:52 AM

## 2015-06-04 NOTE — H&P (Signed)
  All labs reviewed. Abnormal studies sent to patients PCP when indicated.  Previous H&P reviewed, patient examined, there are NO CHANGES.  Lisa Eatherly LOUIS6/7/20168:22 AM

## 2015-06-18 ENCOUNTER — Ambulatory Visit (INDEPENDENT_AMBULATORY_CARE_PROVIDER_SITE_OTHER): Payer: Medicare Other | Admitting: Internal Medicine

## 2015-06-18 ENCOUNTER — Encounter: Payer: Self-pay | Admitting: Internal Medicine

## 2015-06-18 VITALS — BP 134/74 | HR 80 | Temp 97.6°F | Resp 14 | Ht 61.0 in | Wt 159.2 lb

## 2015-06-18 DIAGNOSIS — I10 Essential (primary) hypertension: Secondary | ICD-10-CM | POA: Diagnosis not present

## 2015-06-18 DIAGNOSIS — Z9181 History of falling: Secondary | ICD-10-CM

## 2015-06-18 DIAGNOSIS — E890 Postprocedural hypothyroidism: Secondary | ICD-10-CM

## 2015-06-18 DIAGNOSIS — R296 Repeated falls: Secondary | ICD-10-CM

## 2015-06-18 DIAGNOSIS — Z13820 Encounter for screening for osteoporosis: Secondary | ICD-10-CM | POA: Diagnosis not present

## 2015-06-18 DIAGNOSIS — M810 Age-related osteoporosis without current pathological fracture: Secondary | ICD-10-CM

## 2015-06-18 DIAGNOSIS — E669 Obesity, unspecified: Secondary | ICD-10-CM

## 2015-06-18 DIAGNOSIS — E785 Hyperlipidemia, unspecified: Secondary | ICD-10-CM

## 2015-06-18 NOTE — Progress Notes (Signed)
Pre-visit discussion using our clinic review tool. No additional management support is needed unless otherwise documented below in the visit note.  

## 2015-06-18 NOTE — Progress Notes (Addendum)
Subjective:  Patient ID: Lisa Figueroa, female    DOB: 1932/10/13  Age: 81 y.o. MRN: [id number]  CC: The primary encounter diagnosis was Screening for osteoporosis. Diagnoses of History of bad fall, Osteoporosis, Essential hypertension, Postsurgical hypothyroidism, Hyperlipidemia, and Obesity were also pertinent to this visit.  HPI Lisa Figueroa presents for 6 month follow up on hyperlipidemia,  Iatrogenic Hypothyroidism ,  And elevate BP at last visit  Recent mammogram showed axillary LAD c/w CLL (diagnosed 3 years ago by axillary LN biopsy) .  No PET scan done this year   She continues to suffer from post herpetic neuralgia after a shingles episode 3 years ago ,  Still wakes her up at night despite using gabapentin bid   She had Cataract surgery by Porfil[facility] two weeks ago on the  left eye   History of a traumatic fall which occurred at home in February with fractures to pelvis and left radial bone.  She was treated by Dr Rudene Christians.  Discussed DEXA scan,  Discussed of medications    Outpatient Prescriptions Prior to Visit  Medication Sig Dispense Refill  . acetaminophen (TYLENOL) 500 MG tablet Take 500 mg by mouth daily.    . Cholecalciferol (HM VITAMIN D3) 2000 UNITS CAPS Take 1 capsule by mouth daily.    Marland Kitchen gabapentin (NEURONTIN) 300 MG capsule Take 1 capsule (300 mg total) by mouth 2 (two) times daily. 180 capsule 1  . levothyroxine (SYNTHROID, LEVOTHROID) 175 MCG tablet Take 1 tablet (175 mcg total) by mouth daily. 90 tablet 0  . losartan (COZAAR) 100 MG tablet TAKE ONE (1) TABLET EACH DAY 30 tablet 5  . simvastatin (ZOCOR) 20 MG tablet TAKE ONE (1) TABLET EACH DAY 30 tablet 5   No facility-administered medications prior to visit.    Review of Systems;  Patient denies headache, fevers, malaise, unintentional weight loss, skin rash, eye pain, sinus congestion and sinus pain, sore throat, dysphagia,  hemoptysis , cough, dyspnea, wheezing, chest pain, palpitations, orthopnea, edema,  abdominal pain, nausea, melena, diarrhea, constipation, flank pain, dysuria, hematuria, urinary  Frequency, nocturia, numbness, tingling, seizures,  Focal weakness, Loss of consciousness,  Tremor, insomnia, depression, anxiety, and suicidal ideation.      Objective:  BP 134/74 mmHg  Pulse 80  Temp(Src) 97.6 F (36.4 C) (Oral)  Resp 14  Ht 5\' 1"  (1.549 m)  Wt 159 lb 4 oz (72.235 kg)  BMI 30.11 kg/m2  SpO2 96%  BP Readings from Last 3 Encounters:  06/18/15 134/74  06/04/15 171/82  05/08/15 110/80    Wt Readings from Last 3 Encounters:  06/18/15 159 lb 4 oz (72.235 kg)  06/04/15 163 lb (73.936 kg)  05/08/15 163 lb 8 oz (74.163 kg)    General appearance: alert, cooperative and appears stated age Ears: normal TM's and external ear canals both ears Throat: lips, mucosa, and tongue normal; teeth and gums normal Neck: no adenopathy, no carotid bruit, supple, symmetrical, trachea midline and thyroid not enlarged, symmetric, no tenderness/mass/nodules Back: symmetric, no curvature. ROM normal. No CVA tenderness. Lungs: clear to auscultation bilaterally Heart: regular rate and rhythm, S1, S2 normal, no murmur, click, rub or gallop Abdomen: soft, non-tender; bowel sounds normal; no masses,  no organomegaly Pulses: 2+ and symmetric Skin: Skin color, texture, turgor normal. No rashes or lesions Lymph nodes: Cervical, supraclavicular, and axillary nodes normal.  Lab Results  Component Value Date   HGBA1C 5.2 05/14/2015    Lab Results  Component Value Date   CREATININE 0.64  05/14/2015   CREATININE 0.61 05/01/2015   CREATININE 0.7 11/15/2014    Lab Results  Component Value Date   WBC 19.5 Repeated and verified X2.* 05/14/2015   HGB 14.8 05/14/2015   HCT 44.8 05/14/2015   PLT 189.0 05/14/2015   GLUCOSE 80 05/14/2015   CHOL 171 05/14/2015   TRIG 100.0 05/14/2015   HDL 52.00 05/14/2015   LDLCALC 99 05/14/2015   ALT 9 05/14/2015   AST 16 05/14/2015   NA 135 05/14/2015   K  4.5 05/28/2015   CL 98 05/14/2015   CREATININE 0.64 05/14/2015   BUN 12 05/14/2015   CO2 35* 05/14/2015   TSH 0.058* 05/14/2015   INR 1.0 06/27/2013   HGBA1C 5.2 05/14/2015    No results found.  Assessment & Plan:   Problem List Items Addressed This Visit    Osteoporosis    She has had another pelvic fracture and forearm fracture. She has not had a recent DEXA. Review of old records indicate prior pelvic fractures with no clear history of therapy.  Discussed repeating a baseline  DEXA scan  And starting alendronate.          Postsurgical hypothyroidism    Secondary to resection for thyroid CA.   her TSH is appropriately suppressed  Lab Results  Component Value Date   TSH 0.058* 05/14/2015           Essential hypertension    Well controlled on current regimen. Renal function stable, no changes today.  Lab Results  Component Value Date   CREATININE 0.64 05/14/2015   Lab Results  Component Value Date   NA 135 05/14/2015   K 4.5 05/28/2015   CL 98 05/14/2015   CO2 35* 05/14/2015         Hyperlipidemia    Managed with simvastatin to reduce her risk of CAD.   Lab Results  Component Value Date   CHOL 171 05/14/2015   HDL 52.00 05/14/2015   LDLCALC 99 05/14/2015   TRIG 100.0 05/14/2015   CHOLHDL 3 05/14/2015   Lab Results  Component Value Date   ALT 9 05/14/2015   AST 16 05/14/2015   ALKPHOS 74 05/14/2015   BILITOT 0.6 05/14/2015         History of bad fall    Reviewed the conditions surrounding her fall.  She has adequate lighting at home in all areas and her carpets are well stretched and bathroom has bars .  She prefers to continue living independently.       Obesity    I have addressed  BMI and recommended a low glycemic index diet utilizing smaller more frequent meals to increase metabolism.  I have also recommended that patient start exercising with a goal of 30 minutes of aerobic exercise a minimum of 5 days per week.        Other Visit  Diagnoses    Screening for osteoporosis    -  Primary    Relevant Orders    DG Bone Density      A total of 25 minutes of face to face time was spent with patient more than half of which was spent in counselling about the above mentioned conditions  and coordination of care   I am having Ms. Tenpenny maintain her Cholecalciferol, acetaminophen, simvastatin, losartan, levothyroxine, gabapentin, and DUREZOL.  Meds ordered this encounter  Medications  . DUREZOL 0.05 % EMUL    Sig: Place 1 drop into the left eye daily.  There are no discontinued medications.  Follow-up: Return in about 6 months (around 12/18/2015).   Crecencio Mc, MD

## 2015-06-18 NOTE — Patient Instructions (Addendum)
You are doing well!  Yoru labs last month were fine  I'll see you in 6 months for your annual wellness  Please do fasting labs prior to  your visit   I have ordered a bone density test to rule out osteoporosis,  Because you may benefit from cheap medication

## 2015-06-20 DIAGNOSIS — E669 Obesity, unspecified: Secondary | ICD-10-CM | POA: Insufficient documentation

## 2015-06-20 DIAGNOSIS — Z9181 History of falling: Secondary | ICD-10-CM | POA: Insufficient documentation

## 2015-06-20 NOTE — Assessment & Plan Note (Signed)
Managed with simvastatin to reduce her risk of CAD.   Lab Results  Component Value Date   CHOL 171 05/14/2015   HDL 52.00 05/14/2015   LDLCALC 99 05/14/2015   TRIG 100.0 05/14/2015   CHOLHDL 3 05/14/2015   Lab Results  Component Value Date   ALT 9 05/14/2015   AST 16 05/14/2015   ALKPHOS 74 05/14/2015   BILITOT 0.6 05/14/2015

## 2015-06-20 NOTE — Assessment & Plan Note (Signed)
She has had another pelvic fracture and forearm fracture. She has not had a recent DEXA. Review of old records indicate prior pelvic fractures with no clear history of therapy.  Discussed repeating a baseline  DEXA scan  And starting alendronate.

## 2015-06-20 NOTE — Assessment & Plan Note (Signed)
I have addressed  BMI and recommended a low glycemic index diet utilizing smaller more frequent meals to increase metabolism.  I have also recommended that patient start exercising with a goal of 30 minutes of aerobic exercise a minimum of 5 days per week.  

## 2015-06-20 NOTE — Assessment & Plan Note (Signed)
Well controlled on current regimen. Renal function stable, no changes today.  Lab Results  Component Value Date   CREATININE 0.64 05/14/2015   Lab Results  Component Value Date   NA 135 05/14/2015   K 4.5 05/28/2015   CL 98 05/14/2015   CO2 35* 05/14/2015

## 2015-06-20 NOTE — Assessment & Plan Note (Signed)
Reviewed the conditions surrounding her fall.  She has adequate lighting at home in all areas and her carpets are well stretched and bathroom has bars .  She prefers to continue living independently.

## 2015-06-20 NOTE — Assessment & Plan Note (Signed)
Secondary to resection for thyroid CA.   her TSH is appropriately suppressed  Lab Results  Component Value Date   TSH 0.058* 05/14/2015

## 2015-07-30 ENCOUNTER — Ambulatory Visit: Admission: RE | Admit: 2015-07-30 | Payer: Medicare Other | Source: Ambulatory Visit

## 2015-08-22 ENCOUNTER — Encounter: Payer: Self-pay | Admitting: Cardiovascular Disease

## 2015-08-22 ENCOUNTER — Ambulatory Visit (INDEPENDENT_AMBULATORY_CARE_PROVIDER_SITE_OTHER): Payer: Medicare Other | Admitting: Cardiovascular Disease

## 2015-08-22 VITALS — BP 142/100 | HR 74 | Ht 61.0 in | Wt 163.5 lb

## 2015-08-22 DIAGNOSIS — I10 Essential (primary) hypertension: Secondary | ICD-10-CM | POA: Diagnosis not present

## 2015-08-22 DIAGNOSIS — E785 Hyperlipidemia, unspecified: Secondary | ICD-10-CM

## 2015-08-22 DIAGNOSIS — R079 Chest pain, unspecified: Secondary | ICD-10-CM

## 2015-08-22 DIAGNOSIS — R0602 Shortness of breath: Secondary | ICD-10-CM | POA: Diagnosis not present

## 2015-08-22 NOTE — Assessment & Plan Note (Signed)
I requested an echocardiogram for evaluation. She did also have previously mildly dilated aortic root at 4.2 cm which will be evaluated with echo. If it's still the same size, this probably does not need further follow-up.

## 2015-08-22 NOTE — Assessment & Plan Note (Signed)
Blood pressure is mildly elevated recent readings have been overall well controlled.

## 2015-08-22 NOTE — Assessment & Plan Note (Signed)
Lab Results  Component Value Date   CHOL 171 05/14/2015   HDL 52.00 05/14/2015   LDLCALC 99 05/14/2015   TRIG 100.0 05/14/2015   CHOLHDL 3 05/14/2015   Continue treatment with simvastatin. Recommend a target LDL of less than 100.

## 2015-08-22 NOTE — Patient Instructions (Signed)
Medication Instructions:  Your physician recommends that you continue on your current medications as directed. Please refer to the Current Medication list given to you today.   Labwork: none  Testing/Procedures: Your physician has requested that you have an echocardiogram. Echocardiography is a painless test that uses sound waves to create images of your heart. It provides your doctor with information about the size and shape of your heart and how well your heart's chambers and valves are working. This procedure takes approximately one hour. There are no restrictions for this procedure.    Follow-Up: Your physician wants you to follow-up in: one year with Dr. Fletcher Anon. You will receive a reminder letter in the mail two months in advance. If you don't receive a letter, please call our office to schedule the follow-up appointment.   Any Other Special Instructions Will Be Listed Below (If Applicable).  Echocardiogram An echocardiogram, or echocardiography, uses sound waves (ultrasound) to produce an image of your heart. The echocardiogram is simple, painless, obtained within a short period of time, and offers valuable information to your health care provider. The images from an echocardiogram can provide information such as:  Evidence of coronary artery disease (CAD).  Heart size.  Heart muscle function.  Heart valve function.  Aneurysm detection.  Evidence of a past heart attack.  Fluid buildup around the heart.  Heart muscle thickening.  Assess heart valve function. LET [facility] CARE PROVIDER KNOW ABOUT:  Any allergies you have.  All medicines you are taking, including vitamins, herbs, eye drops, creams, and over-the-counter medicines.  Previous problems you or members of your family have had with the use of anesthetics.  Any blood disorders you have.  Previous surgeries you have had.  Medical conditions you have.  Possibility of pregnancy, if this applies. BEFORE  THE PROCEDURE  No special preparation is needed. Eat and drink normally.  PROCEDURE   In order to produce an image of your heart, gel will be applied to your chest and a wand-like tool (transducer) will be moved over your chest. The gel will help transmit the sound waves from the transducer. The sound waves will harmlessly bounce off your heart to allow the heart images to be captured in real-time motion. These images will then be recorded.  You may need an IV to receive a medicine that improves the quality of the pictures. AFTER THE PROCEDURE You may return to your normal schedule including diet, activities, and medicines, unless your health care provider tells you otherwise. Document Released: 12/11/2000 Document Revised: 04/30/2014 Document Reviewed: 08/21/2013 [facility] Patient Information 2015 [place], [place]. This information is not intended to replace advice given to you by your health care provider. Make sure you discuss any questions you have with your health care provider.

## 2015-08-22 NOTE — Progress Notes (Signed)
Primary care physician: Dr. Derrel Nip  HPI  Lisa Figueroa is a 81 y.o. female who is here today for a follow-up visit. She has chronic medical conditions that include post herpetic neuralgia, iatrogenic hypothyroidism secondary to thyroidectomy, and CLL. She has been diagnosed with mildly dilated aortic root at 4.2 cm. History of thyroid cancer diagnosed in 1998, and in 2014 Had axillary LN positive for CLL removed by [facility] .  Previous cardiac catheterization years ago showed  nonobstructive coronary artery disease.  Abdominal aortic ultrasound done this year which showed no evidence of aneurysm. She is doing reasonably well overall. She describes chronic sharp left-sided chest pain since she had shingles. She has no exertional tightness or discomfort. She complains of exertional dyspnea and fatigue.  Allergies  Allergen Reactions  . Penicillin G Swelling  . Prednisone Other (See Comments)    crys and can't sleep crys and can't sleep     Current Outpatient Prescriptions on File Prior to Visit  Medication Sig Dispense Refill  . acetaminophen (TYLENOL) 500 MG tablet Take 500 mg by mouth daily.    . Cholecalciferol (HM VITAMIN D3) 2000 UNITS CAPS Take 1 capsule by mouth daily.    Marland Kitchen gabapentin (NEURONTIN) 300 MG capsule Take 1 capsule (300 mg total) by mouth 2 (two) times daily. 180 capsule 1  . levothyroxine (SYNTHROID, LEVOTHROID) 175 MCG tablet Take 1 tablet (175 mcg total) by mouth daily. 90 tablet 0  . losartan (COZAAR) 100 MG tablet TAKE ONE (1) TABLET EACH DAY 30 tablet 5  . simvastatin (ZOCOR) 20 MG tablet TAKE ONE (1) TABLET EACH DAY 30 tablet 5   No current facility-administered medications on file prior to visit.     Past Medical History  Diagnosis Date  . Thyroid cancer 1998  . History of colon polyps   . History of shingles   . Post herpetic neuralgia   . AAA (abdominal aortic aneurysm)   . Hypertension   . Hyperlipidemia   . Thyroid cancer   . CLL (chronic  lymphocytic leukemia)      Past Surgical History  Procedure Laterality Date  . Cholecystectomy    . Abdominal hysterectomy    . Hemorrhoid surgery    . Total thyroidectomy  1998  . Appendectomy    . Breast surgery Right     cyst excision  . Cardiac catheterization  05/18/2008    At [facility]: mild 30% stenosis in LCX. normal EF.    . Cataract extraction w/phaco Left 06/04/2015    Procedure: CATARACT EXTRACTION PHACO AND INTRAOCULAR LENS PLACEMENT (IOC);  Surgeon: Birder Robson, MD;  Location: [facility] ORS;  Service: Ophthalmology;  Laterality: Left;  [place] 00:34.1 AP% 23.0 CDE 7.85     Family History  Problem Relation Age of Onset  . Cancer Brother     prostate and bone  . Cancer Maternal Aunt     breast  . Cancer Cousin     breast - maternal side  . Heart disease Mother   . Heart disease Father      Social History   Social History  . Marital Status: Married    Spouse Name: N/A  . Number of Children: N/A  . Years of Education: N/A   Occupational History  . Not on file.   Social History Main Topics  . Smoking status: Former Smoker -- 1.00 packs/day for 20 years    Quit date: 08/08/1984  . Smokeless tobacco: Not on file  . Alcohol Use: 0.0 oz/week  0 Standard drinks or equivalent per week     Comment: wine glass daily  . Drug Use: No  . Sexual Activity: Not on file   Other Topics Concern  . Not on file   Social History Narrative     ROS A 10 point review of system was performed. It is negative other than that mentioned in the history of present illness.   PHYSICAL EXAM   BP 142/100 mmHg  Pulse 74  Ht 5\' 1"  (1.549 m)  Wt 163 lb 8 oz (74.163 kg)  BMI 30.91 kg/m2 Constitutional: She is oriented to person, place, and time. She appears well-developed and well-nourished. No distress.  HENT: No nasal discharge.  Head: Normocephalic and atraumatic.  Eyes: Pupils are equal and round. No discharge.  Neck: Normal range of motion. Neck supple. No JVD present. No  thyromegaly present.  Cardiovascular: Normal rate, regular rhythm, normal heart sounds. Exam reveals no gallop and no friction rub. No murmur heard.  Pulmonary/Chest: Effort normal and breath sounds normal. No stridor. No respiratory distress. She has no wheezes. She has no rales. She exhibits no tenderness.  Abdominal: Soft. Bowel sounds are normal. She exhibits no distension. There is no tenderness. There is no rebound and no guarding.  Musculoskeletal: Normal range of motion. She exhibits no edema and no tenderness.  Neurological: She is alert and oriented to person, place, and time. Coordination normal.  Skin: Skin is warm and dry. No rash noted. She is not diaphoretic. No erythema. No pallor.  Psychiatric: She has a normal mood and affect. Her behavior is normal. Judgment and thought content normal.     [id number]  Rhythm  WITHIN NORMAL LIMITS  ASSESSMENT AND PLAN

## 2015-09-06 ENCOUNTER — Ambulatory Visit (INDEPENDENT_AMBULATORY_CARE_PROVIDER_SITE_OTHER): Payer: Medicare Other

## 2015-09-06 ENCOUNTER — Other Ambulatory Visit: Payer: Self-pay

## 2015-09-06 DIAGNOSIS — R0602 Shortness of breath: Secondary | ICD-10-CM | POA: Diagnosis not present

## 2015-09-12 ENCOUNTER — Telehealth: Payer: Self-pay | Admitting: *Deleted

## 2015-09-12 NOTE — Telephone Encounter (Signed)
Pt calling asking for her echo results  Please call when ready.

## 2015-09-25 ENCOUNTER — Other Ambulatory Visit: Payer: Self-pay | Admitting: Nurse Practitioner

## 2015-10-02 ENCOUNTER — Telehealth: Payer: Self-pay | Admitting: Internal Medicine

## 2015-10-02 NOTE — Telephone Encounter (Signed)
Called patient and clarified concerns surrounding the vaccines.

## 2015-10-02 NOTE — Telephone Encounter (Signed)
Pt has a question regarding flu shot and the pneumonia shot? Thank You!

## 2015-10-03 DIAGNOSIS — Z23 Encounter for immunization: Secondary | ICD-10-CM | POA: Diagnosis not present

## 2015-10-21 DIAGNOSIS — H43813 Vitreous degeneration, bilateral: Secondary | ICD-10-CM | POA: Diagnosis not present

## 2015-10-30 ENCOUNTER — Encounter: Payer: Self-pay | Admitting: Oncology

## 2015-10-30 ENCOUNTER — Inpatient Hospital Stay: Payer: Medicare Other | Attending: Oncology

## 2015-10-30 ENCOUNTER — Inpatient Hospital Stay (HOSPITAL_BASED_OUTPATIENT_CLINIC_OR_DEPARTMENT_OTHER): Payer: Medicare Other | Admitting: Oncology

## 2015-10-30 VITALS — BP 159/88 | HR 69 | Temp 97.7°F | Wt 163.6 lb

## 2015-10-30 DIAGNOSIS — R599 Enlarged lymph nodes, unspecified: Secondary | ICD-10-CM

## 2015-10-30 DIAGNOSIS — C9110 Chronic lymphocytic leukemia of B-cell type not having achieved remission: Secondary | ICD-10-CM

## 2015-10-30 DIAGNOSIS — Z79899 Other long term (current) drug therapy: Secondary | ICD-10-CM

## 2015-10-30 DIAGNOSIS — Z809 Family history of malignant neoplasm, unspecified: Secondary | ICD-10-CM

## 2015-10-30 DIAGNOSIS — Z803 Family history of malignant neoplasm of breast: Secondary | ICD-10-CM

## 2015-10-30 DIAGNOSIS — E785 Hyperlipidemia, unspecified: Secondary | ICD-10-CM

## 2015-10-30 DIAGNOSIS — B0229 Other postherpetic nervous system involvement: Secondary | ICD-10-CM

## 2015-10-30 DIAGNOSIS — Z85850 Personal history of malignant neoplasm of thyroid: Secondary | ICD-10-CM | POA: Insufficient documentation

## 2015-10-30 DIAGNOSIS — I10 Essential (primary) hypertension: Secondary | ICD-10-CM

## 2015-10-30 DIAGNOSIS — Z87891 Personal history of nicotine dependence: Secondary | ICD-10-CM | POA: Diagnosis not present

## 2015-10-30 DIAGNOSIS — Z86010 Personal history of colonic polyps: Secondary | ICD-10-CM | POA: Insufficient documentation

## 2015-10-30 LAB — CBC WITH DIFFERENTIAL/PLATELET
BASOS PCT: 1 %
Basophils Absolute: 0.2 10*3/uL — ABNORMAL HIGH (ref 0–0.1)
EOS PCT: 1 %
Eosinophils Absolute: 0.2 10*3/uL (ref 0–0.7)
HEMATOCRIT: 46.7 % (ref 35.0–47.0)
Hemoglobin: 15 g/dL (ref 12.0–16.0)
LYMPHS PCT: 78 %
Lymphs Abs: 19.1 10*3/uL — ABNORMAL HIGH (ref 1.0–3.6)
MCH: 27.3 pg (ref 26.0–34.0)
MCHC: 32.1 g/dL (ref 32.0–36.0)
MCV: 85.2 fL (ref 80.0–100.0)
MONO ABS: 0.4 10*3/uL (ref 0.2–0.9)
Monocytes Relative: 2 %
Neutro Abs: 4.5 10*3/uL (ref 1.4–6.5)
Neutrophils Relative %: 18 %
Platelets: 169 10*3/uL (ref 150–440)
RBC: 5.48 MIL/uL — ABNORMAL HIGH (ref 3.80–5.20)
RDW: 13.7 % (ref 11.5–14.5)
WBC: 24.3 10*3/uL — ABNORMAL HIGH (ref 3.6–11.0)

## 2015-10-30 LAB — COMPREHENSIVE METABOLIC PANEL
ALBUMIN: 4.6 g/dL (ref 3.5–5.0)
ALK PHOS: 54 U/L (ref 38–126)
ALT: 13 U/L — ABNORMAL LOW (ref 14–54)
AST: 19 U/L (ref 15–41)
Anion gap: 7 (ref 5–15)
BILIRUBIN TOTAL: 0.7 mg/dL (ref 0.3–1.2)
BUN: 11 mg/dL (ref 6–20)
CALCIUM: 8.4 mg/dL — AB (ref 8.9–10.3)
CO2: 30 mmol/L (ref 22–32)
CREATININE: 0.63 mg/dL (ref 0.44–1.00)
Chloride: 99 mmol/L — ABNORMAL LOW (ref 101–111)
GFR calc Af Amer: >60 mL/min (ref >60)
GFR calc non Af Amer: >60 mL/min (ref >60)
GLUCOSE: 90 mg/dL (ref 65–99)
Potassium: 4.2 mmol/L (ref 3.5–5.1)
Sodium: 136 mmol/L (ref 135–145)
Total Protein: 7.8 g/dL (ref 6.5–8.1)

## 2015-10-30 LAB — LACTATE DEHYDROGENASE: LDH: 145 U/L (ref 98–192)

## 2015-10-30 NOTE — Progress Notes (Signed)
[place] @ [facility] [phone]  Fax:([phone] [place] OB: [birth year]  MR#: [id number]  JYN#:[id number]  Patient Care Team: Crecencio Mc, MD as PCP - General (Internal Medicine) [name], MD as Consulting Physician (General Surgery)  CHIEF COMPLAINT:  Chief Complaint  Patient presents with  . OTHER    Oncology History    1. Monoclonal population of lymphocytes.  Lymphoproliferative disease. 81 year old lady who had an abnormal mammogram mid axillary lymphadenopathy core biopsies which he suggested a lymphoproliferative disease positive staining for CD5 and CD20.     CLL (chronic lymphocytic leukemia) ([place])   11/18/2014 Initial Diagnosis CLL (chronic lymphocytic leukemia)    No flowsheet data found.  INTERVAL HISTORY: 81year-old lady came today further follow-up regarding chronic lymphocytic leukemia.  Patient continues to a postherpetic neuralgia.  Palpable lymphadenopathy persists.  Here for further follow-up and treatment consideration.  No chills.  No fever.  Has not lost any weight. Patient is here for further follow-up and treatment consideration.  On a recent breast examination with by mammogram some lymph nodes were found.  Patient does have palpable lymphadenopathy.  Secondary to chronic lymphocytic leukemia. Patient remains asymptomatic No chills.  No fever. Here for further follow-up and treatment consideration    REVIEW OF SYSTEMS:   GENERAL:  Feels good.  Active.  No fevers, sweats or weight loss. PERFORMANCE STATUS (ECOG):  1 HEENT:  No visual changes, runny nose, sore throat, mouth sores or tenderness. Lungs: No shortness of breath or cough.  No hemoptysis. Cardiac:  No chest pain, palpitations, orthopnea, or PND. GI:  No nausea, vomiting, diarrhea, constipation, melena or hematochezia. GU:  No urgency, frequency, dysuria, or hematuria. Musculoskeletal:  No back pain.  No joint pain.  No muscle  tenderness. Extremities:  No pain or swelling. Skin:  No rashes or skin changes. Neuro:  No headache, numbness or weakness, balance or coordination issues. Endocrine:  No diabetes, thyroid issues, hot flashes or night sweats. Psych:  No mood changes, depression or anxiety. Pain:  No focal pain. Review of systems:  All other systems reviewed and found to be negative.  As per HPI. Otherwise, a complete review of systems is negatve.  PAST MEDICAL HISTORY: Past Medical History  Diagnosis Date  . Thyroid cancer ([place]) 1998  . History of colon polyps   . History of shingles   . Post herpetic neuralgia   . Hypertension   . Hyperlipidemia   . Thyroid cancer ([place])   . CLL (chronic lymphocytic leukemia) ([place])     PAST SURGICAL HISTORY: Past Surgical History  Procedure Laterality Date  . Cholecystectomy    . Abdominal hysterectomy    . Hemorrhoid surgery    . Total thyroidectomy  1998  . Appendectomy    . Breast surgery Right     cyst excision  . Cardiac catheterization  05/18/2008    At [facility]: mild 30% stenosis in LCX. normal EF.    . Cataract extraction w/phaco Left 06/04/2015    Procedure: CATARACT EXTRACTION PHACO AND INTRAOCULAR LENS PLACEMENT (IOC);  Surgeon: Birder Robson, MD;  Location: [facility] ORS;  Service: Ophthalmology;  Laterality: Left;  [place] 00:34.1 AP% 23.0 CDE 7.85    FAMILY HISTORY Family History  Problem Relation Age of Onset  . Cancer Brother     prostate and bone  . Cancer Maternal Aunt     breast  . Cancer Cousin     breast - maternal side  . Heart  disease Mother   . Heart disease Father    Significant History/PMH:   thyroid cancer:    core biopsy lymph node axilla:    Appendectomy:    Hemorrhoidectomy:    Hysterectomy:    Cholecystectomy:    Thyroidectomy:   Smoking History: Smoking History Never Smoked.  PFSH: Comments: aunt  had a breast cancer  Comments: Does not smoke.  Does not drink.  Additional Past Medical and Surgical History:  As mentioned above   GYNECOLOGIC HISTORY:  No LMP recorded. Patient has had a hysterectomy.     ADVANCED DIRECTIVES:    HEALTH MAINTENANCE: Social History  Substance Use Topics  . Smoking status: Former Smoker -- 1.00 packs/day for 20 years    Quit date: 08/08/1984  . Smokeless tobacco: None  . Alcohol Use: 0.0 oz/week    0 Standard drinks or equivalent per week     Comment: wine glass daily     Colonoscopy:  PAP:  Bone density:  Lipid panel:  Allergies  Allergen Reactions  . Penicillin G Swelling  . Prednisone Other (See Comments)    crys and can't sleep crys and can't sleep    Current Outpatient Prescriptions  Medication Sig Dispense Refill  . acetaminophen (TYLENOL) 500 MG tablet Take 500 mg by mouth daily.    . Cholecalciferol (HM VITAMIN D3) 2000 UNITS CAPS Take 1 capsule by mouth daily.    Marland Kitchen gabapentin (NEURONTIN) 300 MG capsule Take 1 capsule (300 mg total) by mouth 2 (two) times daily. 180 capsule 1  . levothyroxine (SYNTHROID, LEVOTHROID) 175 MCG tablet TAKE 1 TABLET BY MOUTH EVERY DAY. 90 tablet 1  . losartan (COZAAR) 100 MG tablet TAKE ONE (1) TABLET EACH DAY 30 tablet 1  . simvastatin (ZOCOR) 20 MG tablet TAKE ONE (1) TABLET EACH DAY 30 tablet 1   No current facility-administered medications for this visit.    OBJECTIVE:  Filed Vitals:   10/30/15 1210  BP: 159/88  Pulse: 69  Temp: 97.7 F (36.5 C)     Body mass index is 30.92 kg/(m^2).    ECOG FS:1 - Symptomatic but completely ambulatory  PHYSICAL EXAM: GENERAL:  Well developed, well nourished, sitting comfortably in the exam room in no acute distress. MENTAL STATUS:  Alert and oriented to person, place and time. HEAD:  Normocephalic, atraumatic, face symmetric, no Cushingoid features. EYES:   Pupils equal round and reactive to light and accomodation.  No conjunctivitis or scleral icterus. ENT:  Oropharynx clear without lesion.  Tongue normal. Mucous membranes moist.  RESPIRATORY:  Clear to  auscultation without rales, wheezes or rhonchi. CARDIOVASCULAR:  Regular rate and rhythm without murmur, rub or gallop. BREAST:  Right breast without masses, skin changes or nipple discharge.  Left breast without masses, skin changes or nipple discharge. ABDOMEN:  Soft, non-tender, with active bowel sounds, and no hepatosplenomegaly.  No masses. BACK:  No CVA tenderness.  No tenderness on percussion of the back or rib cage. SKIN:  No rashes, ulcers or lesions. EXTREMITIES: No edema, no skin discoloration or tenderness.  No palpable cords. LYMPH NODES: Multiple small palpable axillary lymph node on the both sides unchanged \ Multiple small lymph nodes in the neck also unchanged  NEUROLOGICAL: Unremarkable. PSYCH:  Appropriate.   LAB RESULTS:  Appointment on 10/30/2015  Component Date Value Ref Range Status  . WBC 10/30/2015 24.3* 3.6 - 11.0 K/uL Final  . RBC 10/30/2015 5.48* 3.80 - 5.20 MIL/uL Final  . Hemoglobin 10/30/2015 15.0  12.0 -  16.0 g/dL Final  . HCT 10/30/2015 46.7  35.0 - 47.0 % Final  . MCV 10/30/2015 85.2  80.0 - 100.0 fL Final  . MCH 10/30/2015 27.3  26.0 - 34.0 pg Final  . MCHC 10/30/2015 32.1  32.0 - 36.0 g/dL Final  . RDW 10/30/2015 13.7  11.5 - 14.5 % Final  . Platelets 10/30/2015 169  150 - 440 K/uL Final  . Neutrophils Relative % 10/30/2015 18   Final  . Neutro Abs 10/30/2015 4.5  1.4 - 6.5 K/uL Final  . Lymphocytes Relative 10/30/2015 78   Final  . Lymphs Abs 10/30/2015 19.1* 1.0 - 3.6 K/uL Final  . Monocytes Relative 10/30/2015 2   Final  . Monocytes Absolute 10/30/2015 0.4  0.2 - 0.9 K/uL Final  . Eosinophils Relative 10/30/2015 1   Final  . Eosinophils Absolute 10/30/2015 0.2  0 - 0.7 K/uL Final  . Basophils Relative 10/30/2015 1   Final  . Basophils Absolute 10/30/2015 0.2* 0 - 0.1 K/uL Final  . Sodium 10/30/2015 136  135 - 145 mmol/L Final  . Potassium 10/30/2015 4.2  3.5 - 5.1 mmol/L Final  . Chloride 10/30/2015 99* 101 - 111 mmol/L Final  . CO2  10/30/2015 30  22 - 32 mmol/L Final  . Glucose, Bld 10/30/2015 90  65 - 99 mg/dL Final  . BUN 10/30/2015 11  6 - 20 mg/dL Final  . Creatinine, Ser 10/30/2015 0.63  0.44 - 1.00 mg/dL Final  . Calcium 10/30/2015 8.4* 8.9 - 10.3 mg/dL Final  . Total Protein 10/30/2015 7.8  6.5 - 8.1 g/dL Final  . Albumin 10/30/2015 4.6  3.5 - 5.0 g/dL Final  . AST 10/30/2015 19  15 - 41 U/L Final  . ALT 10/30/2015 13* 14 - 54 U/L Final  . Alkaline Phosphatase 10/30/2015 54  38 - 126 U/L Final  . Total Bilirubin 10/30/2015 0.7  0.3 - 1.2 mg/dL Final  . GFR calc non Af Amer 10/30/2015 >60  >60 mL/min Final  . GFR calc Af Amer 10/30/2015 >60  >60 mL/min Final   Comment: (NOTE) The eGFR has been calculated using the CKD EPI equation. This calculation has not been validated in all clinical situations. eGFR's persistently <60 mL/min signify possible Chronic Kidney Disease.   . Anion gap 10/30/2015 7  5 - 15 Final  . LDH 10/30/2015 145  98 - 192 U/L Final       ASSESSMENT: Monoclonal population of lymphocytes and palpable lymph node suggestive of chronic lymphocytic leukemia.  Hemoglobin white count stable CHRONIC  lymphocytic leukemia hours small cell lymphocytic lymphoma Hemoglobin and platelet count remained stable. Mammogram from outside institution has been reviewed    MEDICAL DECISION MAKING:  All lab data has been reviewed.  We are still dealing with stage 0 chronic lymphocytic leukemia and no further intervention has been planned.  Continue observation has been discussed with the patient and she is in agreement with it  Patient expressed understanding and was in agreement with this plan. She also understands that She can call clinic at any time with any questions, concerns, or complaints.    No matching staging information was found for the patient.  Forest Gleason, MD   10/30/2015 12:29 PM

## 2015-11-02 ENCOUNTER — Encounter: Payer: Self-pay | Admitting: Oncology

## 2015-12-17 ENCOUNTER — Encounter: Payer: Medicare Other | Admitting: Internal Medicine

## 2016-01-10 ENCOUNTER — Encounter: Payer: Medicare Other | Admitting: Internal Medicine

## 2016-01-13 ENCOUNTER — Other Ambulatory Visit: Payer: Self-pay | Admitting: Nurse Practitioner

## 2016-01-13 ENCOUNTER — Telehealth: Payer: Self-pay | Admitting: Internal Medicine

## 2016-01-13 NOTE — Telephone Encounter (Signed)
Needs to have OV.

## 2016-01-13 NOTE — Telephone Encounter (Signed)
Pt called she is going to run out of her medication before her appt on 01/31/2016. No appt avail before that date. Will she be able to get enough pills until her appt? Call pt @ [phone]. Pharmacy is [place], [place] - [place]. Thank You!

## 2016-01-14 MED ORDER — LOSARTAN POTASSIUM 100 MG PO TABS: 100.0000 mg | ORAL_TABLET | Freq: Every day | ORAL | Status: DC

## 2016-01-14 MED ORDER — SIMVASTATIN 20 MG PO TABS: 20.0000 mg | ORAL_TABLET | Freq: Every day | ORAL | Status: DC

## 2016-01-20 DIAGNOSIS — Z08 Encounter for follow-up examination after completed treatment for malignant neoplasm: Secondary | ICD-10-CM | POA: Diagnosis not present

## 2016-01-20 DIAGNOSIS — Z1283 Encounter for screening for malignant neoplasm of skin: Secondary | ICD-10-CM | POA: Diagnosis not present

## 2016-01-20 DIAGNOSIS — Z85828 Personal history of other malignant neoplasm of skin: Secondary | ICD-10-CM | POA: Diagnosis not present

## 2016-01-20 DIAGNOSIS — Z872 Personal history of diseases of the skin and subcutaneous tissue: Secondary | ICD-10-CM | POA: Diagnosis not present

## 2016-01-20 DIAGNOSIS — L821 Other seborrheic keratosis: Secondary | ICD-10-CM | POA: Diagnosis not present

## 2016-01-20 DIAGNOSIS — D1801 Hemangioma of skin and subcutaneous tissue: Secondary | ICD-10-CM | POA: Diagnosis not present

## 2016-01-20 DIAGNOSIS — L728 Other follicular cysts of the skin and subcutaneous tissue: Secondary | ICD-10-CM | POA: Diagnosis not present

## 2016-01-31 ENCOUNTER — Other Ambulatory Visit: Payer: Medicare Other

## 2016-01-31 ENCOUNTER — Telehealth: Payer: Self-pay

## 2016-01-31 ENCOUNTER — Ambulatory Visit: Payer: Medicare Other | Admitting: *Deleted

## 2016-01-31 ENCOUNTER — Ambulatory Visit (INDEPENDENT_AMBULATORY_CARE_PROVIDER_SITE_OTHER): Payer: Medicare Other | Admitting: Internal Medicine

## 2016-01-31 ENCOUNTER — Encounter: Payer: Self-pay | Admitting: Internal Medicine

## 2016-01-31 VITALS — BP 140/86 | HR 77

## 2016-01-31 VITALS — BP 180/80 | HR 59 | Temp 97.4°F | Resp 12 | Ht 61.0 in | Wt 163.5 lb

## 2016-01-31 DIAGNOSIS — I10 Essential (primary) hypertension: Secondary | ICD-10-CM

## 2016-01-31 DIAGNOSIS — Z7289 Other problems related to lifestyle: Secondary | ICD-10-CM | POA: Diagnosis not present

## 2016-01-31 DIAGNOSIS — E559 Vitamin D deficiency, unspecified: Secondary | ICD-10-CM | POA: Diagnosis not present

## 2016-01-31 DIAGNOSIS — E890 Postprocedural hypothyroidism: Secondary | ICD-10-CM

## 2016-01-31 DIAGNOSIS — M84675P Pathological fracture in other disease, left foot, subsequent encounter for fracture with malunion: Secondary | ICD-10-CM

## 2016-01-31 DIAGNOSIS — M810 Age-related osteoporosis without current pathological fracture: Secondary | ICD-10-CM

## 2016-01-31 DIAGNOSIS — Z23 Encounter for immunization: Secondary | ICD-10-CM | POA: Diagnosis not present

## 2016-01-31 DIAGNOSIS — C9110 Chronic lymphocytic leukemia of B-cell type not having achieved remission: Secondary | ICD-10-CM | POA: Diagnosis not present

## 2016-01-31 LAB — CBC WITH DIFFERENTIAL/PLATELET
BASOS PCT: 0.4 % (ref 0.0–3.0)
Basophils Absolute: 0.1 10*3/uL (ref 0.0–0.1)
EOS PCT: 0.6 % (ref 0.0–5.0)
Eosinophils Absolute: 0.2 10*3/uL (ref 0.0–0.7)
HCT: 48.3 % — ABNORMAL HIGH (ref 36.0–46.0)
Hemoglobin: 15.4 g/dL — ABNORMAL HIGH (ref 12.0–15.0)
LYMPHS ABS: 22.2 10*3/uL — AB (ref 0.7–4.0)
MCHC: 31.9 g/dL (ref 30.0–36.0)
MCV: 85.3 fl (ref 78.0–100.0)
MONOS PCT: 1.6 % — AB (ref 3.0–12.0)
Monocytes Absolute: 0.5 10*3/uL (ref 0.1–1.0)
NEUTROS ABS: 5.2 10*3/uL (ref 1.4–7.7)
Neutrophils Relative %: 18.4 % — ABNORMAL LOW (ref 43.0–77.0)
Platelets: 206 10*3/uL (ref 150.0–400.0)
RBC: 5.67 Mil/uL — ABNORMAL HIGH (ref 3.87–5.11)
RDW: 13.5 % (ref 11.5–15.5)
WBC: 28.1 10*3/uL (ref 4.0–10.5)

## 2016-01-31 LAB — COMPREHENSIVE METABOLIC PANEL
ALBUMIN: 4.5 g/dL (ref 3.5–5.2)
ALK PHOS: 59 U/L (ref 39–117)
ALT: 10 U/L (ref 0–35)
AST: 16 U/L (ref 0–37)
BILIRUBIN TOTAL: 0.7 mg/dL (ref 0.2–1.2)
BUN: 11 mg/dL (ref 6–23)
CALCIUM: 9.9 mg/dL (ref 8.4–10.5)
CO2: 34 mEq/L — ABNORMAL HIGH (ref 19–32)
Chloride: 96 mEq/L (ref 96–112)
Creatinine, Ser: 0.7 mg/dL (ref 0.40–1.20)
GFR: 85.28 mL/min (ref >60.00)
Glucose, Bld: 87 mg/dL (ref 70–99)
POTASSIUM: 4.2 meq/L (ref 3.5–5.1)
Sodium: 136 mEq/L (ref 135–145)
TOTAL PROTEIN: 7.5 g/dL (ref 6.0–8.3)

## 2016-01-31 LAB — MICROALBUMIN / CREATININE URINE RATIO
CREATININE, U: 96.3 mg/dL
MICROALB/CREAT RATIO: 1.2 mg/g (ref 0.0–30.0)
Microalb, Ur: 1.2 mg/dL (ref 0.0–1.9)

## 2016-01-31 LAB — T4, FREE: FREE T4: 2.06 ng/dL — AB (ref 0.60–1.60)

## 2016-01-31 LAB — LIPID PANEL
CHOLESTEROL: 163 mg/dL (ref 0–200)
HDL: 50 mg/dL (ref >39.00)
LDL Cholesterol: 97 mg/dL (ref 0–99)
NonHDL: 113.41
TRIGLYCERIDES: 81 mg/dL (ref 0.0–149.0)
Total CHOL/HDL Ratio: 3
VLDL: 16.2 mg/dL (ref 0.0–40.0)

## 2016-01-31 LAB — VITAMIN D 25 HYDROXY (VIT D DEFICIENCY, FRACTURES): VITD: 40.47 ng/mL (ref 30.00–100.00)

## 2016-01-31 LAB — TSH: TSH: 0.04 u[IU]/mL — ABNORMAL LOW (ref 0.35–4.50)

## 2016-01-31 MED ORDER — GABAPENTIN 400 MG PO CAPS: 400.0000 mg | ORAL_CAPSULE | Freq: Two times a day (BID) | ORAL | Status: DC

## 2016-01-31 MED ORDER — LOSARTAN POTASSIUM-HCTZ 100-12.5 MG PO TABS: 1.0000 | ORAL_TABLET | Freq: Every day | ORAL | Status: DC

## 2016-01-31 NOTE — Addendum Note (Signed)
Addended by: Crecencio Mc on: 01/31/2016 05:07 PM   Modules accepted: Orders

## 2016-01-31 NOTE — Progress Notes (Signed)
Pre-visit discussion using our clinic review tool. No additional management support is needed unless otherwise documented below in the visit note.  

## 2016-01-31 NOTE — Telephone Encounter (Signed)
Tonya from [facility] states that the pt has an elevated critical WBC, the smear has huge reversal. Being sent out for pathological review. Please call Kenney Houseman at [place] number is [phone]

## 2016-01-31 NOTE — Progress Notes (Signed)
Patient ID: Lisa Figueroa, female    DOB: September 17, 1932  Age: 82 y.o. MRN: [id number]  The patient is here for annual  examination and management of other chronic and acute problems.   Still has postherpetic neuralgia on the left side  Keeps her up  At night   The risk factors are reflected in the social history.  The roster of all physicians providing medical care to patient - is listed in the Snapshot section of the chart.  Activities of daily living:  The patient is 100% independent in all ADLs: dressing, toileting, feeding as well as independent mobility  Home safety : The patient has smoke detectors in the home. They wear seatbelts.  There are no firearms at home. There is no violence in the home.   There is no risks for hepatitis, STDs or HIV. There is no   history of blood transfusion. They have no travel history to infectious disease endemic areas of the world.  The patient has seen their dentist in the last six month. They have seen their eye doctor in the last year. They admit to slight hearing difficulty with regard to whispered voices and some television programs.  They have deferred audiologic testing in the last year.  They do not  have excessive sun exposure. Discussed the need for sun protection: hats, long sleeves and use of sunscreen if there is significant sun exposure.   Diet: the importance of a healthy diet is discussed. They do have a healthy diet.  The benefits of regular aerobic exercise were discussed. She does not walk due to pelvic pain from recent fracture. .   Depression screen: there are no signs or vegative symptoms of depression- irritability, change in appetite, anhedonia, sadness/tearfullness.  Cognitive assessment: the patient manages all their financial and personal affairs and is actively engaged. They could relate day,date,year and events; recalled 2/3 objects at 3 minutes; performed clock-face test normally.  The following portions of the patient's  history were reviewed and updated as appropriate: allergies, current medications, past family history, past medical history,  past surgical history, past social history  and problem list.  Visual acuity was not assessed per patient preference since she has regular follow up with her ophthalmologist. Hearing and body mass index were assessed and reviewed.   During the course of the visit the patient was educated and counseled about appropriate screening and preventive services including : fall prevention , diabetes screening, nutrition counseling, colorectal cancer screening, and recommended immunizations.    CC: The primary encounter diagnosis was Essential hypertension. Diagnoses of Postsurgical hypothyroidism, CLL (chronic lymphocytic leukemia) ([place]), Vitamin D deficiency, Other problems related to lifestyle, Need for prophylactic vaccination against Streptococcus pneumoniae (pneumococcus), and Osteoporosis were also pertinent to this visit.  History Lisa Figueroa has a past medical history of Thyroid cancer ([place]) (1998); History of colon polyps; History of shingles; Post herpetic neuralgia; Hypertension; Hyperlipidemia; Thyroid cancer ([place]); and CLL (chronic lymphocytic leukemia) ([place]).   She has past surgical history that includes Cholecystectomy; Abdominal hysterectomy; Hemorrhoid surgery; Total thyroidectomy (1998); Appendectomy; Breast surgery (Right); Cardiac catheterization (05/18/2008); and Cataract extraction w/PHACO (Left, 06/04/2015).   Her family history includes Cancer in her brother, cousin, and maternal aunt; Heart disease in her father and mother.She reports that she quit smoking about 31 years ago. She does not have any smokeless tobacco history on file. She reports that she drinks alcohol. She reports that she does not use illicit drugs.  Outpatient Prescriptions Prior to Visit  Medication Sig  Dispense Refill  . acetaminophen (TYLENOL) 500 MG tablet Take 500 mg by mouth daily.    .  Cholecalciferol (HM VITAMIN D3) 2000 UNITS CAPS Take 1 capsule by mouth daily.    . simvastatin (ZOCOR) 20 MG tablet Take 1 tablet (20 mg total) by mouth daily. 30 tablet 0  . gabapentin (NEURONTIN) 300 MG capsule Take 1 capsule (300 mg total) by mouth 2 (two) times daily. 180 capsule 1  . levothyroxine (SYNTHROID, LEVOTHROID) 175 MCG tablet TAKE 1 TABLET BY MOUTH EVERY DAY. 90 tablet 1  . losartan (COZAAR) 100 MG tablet Take 1 tablet (100 mg total) by mouth daily. 30 tablet 0   No facility-administered medications prior to visit.    Review of Systems   Patient denies headache, fevers, malaise, unintentional weight loss, skin rash, eye pain, sinus congestion and sinus pain, sore throat, dysphagia,  hemoptysis , cough, dyspnea, wheezing, chest pain, palpitations, orthopnea, edema, abdominal pain, nausea, melena, diarrhea, constipation, flank pain, dysuria, hematuria, urinary  Frequency, nocturia, numbness, tingling, seizures,  Focal weakness, Loss of consciousness,  Tremor, insomnia, depression, anxiety, and suicidal ideation.      Objective:  BP 180/80 mmHg  Pulse 59  Temp(Src) 97.4 F (36.3 C) (Oral)  Resp 12  Ht 5\' 1"  (1.549 m)  Wt 163 lb 8 oz (74.163 kg)  BMI 30.91 kg/m2  SpO2 94%  Physical Exam   General appearance: alert, cooperative and appears stated age Head: Normocephalic, without obvious abnormality, atraumatic Eyes: conjunctivae/corneas clear. PERRL, EOM's intact. Fundi benign. Ears: normal TM's and external ear canals both ears Nose: Nares normal. Septum midline. Mucosa normal. No drainage or sinus tenderness. Throat: lips, mucosa, and tongue normal; teeth and gums normal Neck: no adenopathy, no carotid bruit, no JVD, supple, symmetrical, trachea midline and thyroid not enlarged, symmetric, no tenderness/mass/nodules Lungs: clear to auscultation bilaterally Breasts: normal appearance, no masses or tenderness Heart: regular rate and rhythm, S1, S2 normal, no murmur,  click, rub or gallop Abdomen: soft, non-tender; bowel sounds normal; no masses,  no organomegaly Extremities: extremities normal, atraumatic, no cyanosis or edema Pulses: 2+ and symmetric Skin: Skin color, texture, turgor normal. No rashes or lesions Neurologic: Alert and oriented X 3, normal strength and tone. Normal symmetric reflexes. Normal coordination and gait.    Assessment & Plan:   Problem List Items Addressed This Visit    Osteoporosis    She has had multiple traumatic  pelvic fractures and a forearm fracture. DEXA was ordered in June but not done,  For unclear  Reasons.  Will reorder and recommend Prolia ,            Relevant Orders   DG Bone Density   Postsurgical hypothyroidism    Thyroid function is very overactive on current dose.  I have sent a lower dose of levothyroxine to her pharmacy and would like patient to change immediatley.       Relevant Medications   levothyroxine (SYNTHROID, LEVOTHROID) 150 MCG tablet   Other Relevant Orders   TSH (Completed)   T4, free (Completed)   Essential hypertension - Primary    Uncontrolled. Patient returned this afternoon and repeat BP was lower and in agreement with her home monitor.  Adding hctz to losartan.  Lab Results  Component Value Date   CREATININE 0.70 01/31/2016   Lab Results  Component Value Date   NA 136 01/31/2016   K 4.2 01/31/2016   CL 96 01/31/2016   CO2 34* 01/31/2016  Relevant Orders   Comprehensive metabolic panel (Completed)   Lipid panel (Completed)   Microalbumin / creatinine urine ratio (Completed)   CLL (chronic lymphocytic leukemia) ([place])    She is followed semi annually by Oncology.  WBC has been < 30K historically and her hgb and platelets have been normal..  Today's CBC is slightly higher, but still suggestive of [place] not AML.  Follow up with Oncology in 3 months.   Lab Results  Component Value Date   WBC 28.1 Repeated and verified X2.* 01/31/2016   HGB 15.4* 01/31/2016    HCT 48.3* 01/31/2016   MCV 85.3 01/31/2016   PLT 206.0 01/31/2016         Relevant Medications   gabapentin (NEURONTIN) 400 MG capsule   Other Relevant Orders   CBC with Differential/Platelet (Completed)   Need for prophylactic vaccination against Streptococcus pneumoniae (pneumococcus)    Other Visit Diagnoses    Vitamin D deficiency        Relevant Orders    VITAMIN D 25 Hydroxy (Vit-D Deficiency, Fractures) (Completed)    Other problems related to lifestyle        Relevant Orders    Hepatitis C antibody (Completed)       I have discontinued Ms. Pastorino's losartan. I have also changed her gabapentin and levothyroxine. Additionally, I am having her maintain her Cholecalciferol, acetaminophen, and simvastatin.  Meds ordered this encounter  Medications  . gabapentin (NEURONTIN) 400 MG capsule    Sig: Take 1 capsule (400 mg total) by mouth 2 (two) times daily.    Dispense:  270 capsule    Refill:  0  . levothyroxine (SYNTHROID, LEVOTHROID) 150 MCG tablet    Sig: Take 1 tablet (150 mcg total) by mouth daily.    Dispense:  90 tablet    Refill:  0    Medications Discontinued During This Encounter  Medication Reason  . gabapentin (NEURONTIN) 300 MG capsule Reorder  . levothyroxine (SYNTHROID, LEVOTHROID) 175 MCG tablet Reorder  . losartan (COZAAR) 100 MG tablet    A total of 40 minutes was spent with patient more than half of which was spent in counseling patient on the above mentioned issues , reviewing and explaining recent labs and imaging studies done, and coordination of care.  Follow-up: Return in about 4 weeks (around 02/28/2016).   Crecencio Mc, MD

## 2016-01-31 NOTE — Assessment & Plan Note (Addendum)
Uncontrolled. Patient returned this afternoon and repeat BP was lower and in agreement with her home monitor.  Adding hctz to losartan.  Lab Results  Component Value Date   CREATININE 0.70 01/31/2016   Lab Results  Component Value Date   NA 136 01/31/2016   K 4.2 01/31/2016   CL 96 01/31/2016   CO2 34* 01/31/2016

## 2016-01-31 NOTE — Patient Instructions (Signed)
I have increased your gabapentin to 400 mg ,  You can take this up to 3 times daily for your shingles pain (post herpetic neuralgia)  Return this afternoon at 4:30 [place]

## 2016-01-31 NOTE — Progress Notes (Signed)
Patient notified

## 2016-01-31 NOTE — Telephone Encounter (Signed)
Patient has CLL and the lab tech needs to know that plesae let the lab person know

## 2016-01-31 NOTE — Telephone Encounter (Signed)
Informed lab tech about CLL

## 2016-02-01 LAB — HEPATITIS C ANTIBODY: HCV Ab: NEGATIVE

## 2016-02-02 DIAGNOSIS — C9110 Chronic lymphocytic leukemia of B-cell type not having achieved remission: Secondary | ICD-10-CM | POA: Insufficient documentation

## 2016-02-02 MED ORDER — LEVOTHYROXINE SODIUM 150 MCG PO TABS: 150.0000 ug | ORAL_TABLET | Freq: Every day | ORAL | Status: DC

## 2016-02-02 NOTE — Assessment & Plan Note (Signed)
Thyroid function is very overactive on current dose.  I have sent a lower dose of levothyroxine to her pharmacy and would like patient to change immediatley.

## 2016-02-02 NOTE — Assessment & Plan Note (Signed)
She has had multiple traumatic  pelvic fractures and a forearm fracture. DEXA was ordered in June but not done,  For unclear  Reasons.  Will reorder and recommend Prolia ,

## 2016-02-02 NOTE — Assessment & Plan Note (Signed)
She is followed semi annually by Oncology.  WBC has been < 30K historically and her hgb and platelets have been normal..  Today's CBC is slightly higher, but still suggestive of [place] not AML.  Follow up with Oncology in 3 months.   Lab Results  Component Value Date   WBC 28.1 Repeated and verified X2.* 01/31/2016   HGB 15.4* 01/31/2016   HCT 48.3* 01/31/2016   MCV 85.3 01/31/2016   PLT 206.0 01/31/2016

## 2016-02-03 ENCOUNTER — Other Ambulatory Visit: Payer: Self-pay | Admitting: *Deleted

## 2016-02-03 LAB — PATHOLOGIST SMEAR REVIEW

## 2016-02-03 MED ORDER — SIMVASTATIN 20 MG PO TABS: 20.0000 mg | ORAL_TABLET | Freq: Every day | ORAL | Status: DC

## 2016-02-03 MED ORDER — LOSARTAN POTASSIUM-HCTZ 100-12.5 MG PO TABS: 1.0000 | ORAL_TABLET | Freq: Every day | ORAL | Status: DC

## 2016-02-24 DIAGNOSIS — H2511 Age-related nuclear cataract, right eye: Secondary | ICD-10-CM | POA: Diagnosis not present

## 2016-02-28 ENCOUNTER — Ambulatory Visit (INDEPENDENT_AMBULATORY_CARE_PROVIDER_SITE_OTHER): Payer: Medicare Other | Admitting: Internal Medicine

## 2016-02-28 ENCOUNTER — Encounter: Payer: Self-pay | Admitting: Internal Medicine

## 2016-02-28 VITALS — BP 128/74 | HR 56 | Temp 97.6°F | Resp 15 | Ht 61.0 in | Wt 166.0 lb

## 2016-02-28 DIAGNOSIS — E785 Hyperlipidemia, unspecified: Secondary | ICD-10-CM | POA: Diagnosis not present

## 2016-02-28 DIAGNOSIS — M810 Age-related osteoporosis without current pathological fracture: Secondary | ICD-10-CM | POA: Diagnosis not present

## 2016-02-28 DIAGNOSIS — I10 Essential (primary) hypertension: Secondary | ICD-10-CM

## 2016-02-28 NOTE — Progress Notes (Signed)
Pre visit review using our clinic review tool, if applicable. No additional management support is needed unless otherwise documented below in the visit note. 

## 2016-02-28 NOTE — Progress Notes (Signed)
Subjective:  Patient ID: Lisa Figueroa, female    DOB: [birth year]  Age: 82 y.o. MRN: [id number]  CC: The primary encounter diagnosis was Osteoporosis. Diagnoses of Essential hypertension and Hyperlipidemia were also pertinent to this visit.  HPI: Lisa Figueroa presents for one month follow up on hypertension ,  Osteoporosis  DEXA scan ordered at last visit , for the 2nd time,  Still not done because she has refused it.  Does not want to take any more pills. No prior diagnosis,  Last DEXA was with [facility]era years ago.  Last fall June 2016 occurred at home in June 2016, while turning around, foot hit couch   NOT SLEEPING WELL due to nocturia x 3.   considering tylenol PM.  Using a walker at night   Tolerating simvastatin , labs reviewed .  Diet and exercise reviewed.     Outpatient Prescriptions Prior to Visit  Medication Sig Dispense Refill  . acetaminophen (TYLENOL) 500 MG tablet Take 500 mg by mouth daily.    . Cholecalciferol (HM VITAMIN D3) 2000 UNITS CAPS Take 1 capsule by mouth daily.    Marland Kitchen gabapentin (NEURONTIN) 400 MG capsule Take 1 capsule (400 mg total) by mouth 2 (two) times daily. 270 capsule 0  . levothyroxine (SYNTHROID, LEVOTHROID) 150 MCG tablet Take 1 tablet (150 mcg total) by mouth daily. 90 tablet 0  . losartan-hydrochlorothiazide (HYZAAR) 100-12.5 MG tablet Take 1 tablet by mouth daily. 90 tablet 0  . simvastatin (ZOCOR) 20 MG tablet Take 1 tablet (20 mg total) by mouth daily. 90 tablet 1   No facility-administered medications prior to visit.    Review of Systems;  Patient denies headache, fevers, malaise, unintentional weight loss, skin rash, eye pain, sinus congestion and sinus pain, sore throat, dysphagia,  hemoptysis , cough, dyspnea, wheezing, chest pain, palpitations, orthopnea, edema, abdominal pain, nausea, melena, diarrhea, constipation, flank pain, dysuria, hematuria, urinary  Frequency, nocturia, numbness, tingling, seizures,  Focal weakness, Loss of  consciousness,  Tremor, insomnia, depression, anxiety, and suicidal ideation.      Objective:  BP 128/74 mmHg  Pulse 56  Temp(Src) 97.6 F (36.4 C) (Oral)  Resp 15  Ht 5\' 1"  (1.549 m)  Wt 166 lb (75.297 kg)  BMI 31.38 kg/m2  SpO2 93%  BP Readings from Last 3 Encounters:  02/28/16 128/74  01/31/16 140/86  01/31/16 180/80    Wt Readings from Last 3 Encounters:  02/28/16 166 lb (75.297 kg)  01/31/16 163 lb 8 oz (74.163 kg)  10/30/15 163 lb 9.3 oz (74.2 kg)    General appearance: alert, cooperative and appears stated age Ears: normal TM's and external ear canals both ears Throat: lips, mucosa, and tongue normal; teeth and gums normal Neck: no adenopathy, no carotid bruit, supple, symmetrical, trachea midline and thyroid not enlarged, symmetric, no tenderness/mass/nodules Back: symmetric, no curvature. ROM normal. No CVA tenderness. Lungs: clear to auscultation bilaterally Heart: regular rate and rhythm, S1, S2 normal, no murmur, click, rub or gallop Abdomen: soft, non-tender; bowel sounds normal; no masses,  no organomegaly Pulses: 2+ and symmetric Skin: Skin color, texture, turgor normal. No rashes or lesions Lymph nodes: Cervical, supraclavicular, and axillary nodes normal.  Lab Results  Component Value Date   HGBA1C 5.2 05/14/2015    Lab Results  Component Value Date   CREATININE 0.70 01/31/2016   CREATININE 0.63 10/30/2015   CREATININE 0.64 05/14/2015    Lab Results  Component Value Date   WBC 28.1 Repeated and verified X2.* 01/31/2016  HGB 15.4* 01/31/2016   HCT 48.3* 01/31/2016   PLT 206.0 01/31/2016   GLUCOSE 87 01/31/2016   CHOL 163 01/31/2016   TRIG 81.0 01/31/2016   HDL 50.00 01/31/2016   LDLCALC 97 01/31/2016   ALT 10 01/31/2016   AST 16 01/31/2016   NA 136 01/31/2016   K 4.2 01/31/2016   CL 96 01/31/2016   CREATININE 0.70 01/31/2016   BUN 11 01/31/2016   CO2 34* 01/31/2016   TSH 0.04* 01/31/2016   INR 1.0 06/27/2013   HGBA1C 5.2  05/14/2015   MICROALBUR 1.2 01/31/2016    No results found.  Assessment & Plan:   Problem List Items Addressed This Visit    Osteoporosis - Primary    Confirmed by DEXA with recent fractures.  Strongly advised her to consider  therapy with Prolia .  Calcium and Vit d requirements reviewed.       Essential hypertension    Well controlled on current regimen. Renal function stable, no changes today.  Lab Results  Component Value Date   CREATININE 0.70 01/31/2016   Lab Results  Component Value Date   NA 136 01/31/2016   K 4.2 01/31/2016   CL 96 01/31/2016   CO2 34* 01/31/2016         Hyperlipidemia    Managed with simvastatin to reduce her risk of CAD. LFTs are normal  and she is tolerating the medication   Lab Results  Component Value Date   CHOL 163 01/31/2016   HDL 50.00 01/31/2016   LDLCALC 97 01/31/2016   TRIG 81.0 01/31/2016   CHOLHDL 3 01/31/2016   Lab Results  Component Value Date   ALT 10 01/31/2016   AST 16 01/31/2016   ALKPHOS 59 01/31/2016   BILITOT 0.7 01/31/2016            A total of 25 minutes of face to face time was spent with patient more than half of which was spent in counselling and coordination of care    I am having Ms. Kaeo maintain her Cholecalciferol, acetaminophen, gabapentin, levothyroxine, losartan-hydrochlorothiazide, and simvastatin.  No orders of the defined types were placed in this encounter.    There are no discontinued medications.  Follow-up: Return in about 6 months (around 08/30/2016).   Crecencio Mc, MD

## 2016-02-28 NOTE — Patient Instructions (Addendum)
The point of a bone density test is to see if your  bone loss  is significant and places you at higher risk for fracture over the next 10 years.    I recommend treating with Prolia if that is the case   If you decide you want treatment,  Let me know so we can ordered the test first/.     For now I would continue to work at getting a minimum  calcium intake of 1800 mg daily through diet and supplements ,  And 2000 units of vitamin d daily as well.  weight bearing exercise on a regular basis, has been shown to be helpful as well.  I recommend getting the majority of your calcium and Vitamin D  through diet rather than supplements given the recent association of calcium supplements with increased coronary artery calcium scores and concern that taking calcium supplements may increase your risk for heart attacks.

## 2016-03-01 NOTE — Assessment & Plan Note (Signed)
Well controlled on current regimen. Renal function stable, no changes today.  Lab Results  Component Value Date   CREATININE 0.70 01/31/2016   Lab Results  Component Value Date   NA 136 01/31/2016   K 4.2 01/31/2016   CL 96 01/31/2016   CO2 34* 01/31/2016

## 2016-03-01 NOTE — Assessment & Plan Note (Signed)
Managed with simvastatin to reduce her risk of CAD. LFTs are normal  and she is tolerating the medication   Lab Results  Component Value Date   CHOL 163 01/31/2016   HDL 50.00 01/31/2016   LDLCALC 97 01/31/2016   TRIG 81.0 01/31/2016   CHOLHDL 3 01/31/2016   Lab Results  Component Value Date   ALT 10 01/31/2016   AST 16 01/31/2016   ALKPHOS 59 01/31/2016   BILITOT 0.7 01/31/2016

## 2016-03-01 NOTE — Assessment & Plan Note (Signed)
Confirmed by DEXA with recent fractures.  Strongly advised her to consider  therapy with Prolia .  Calcium and Vit d requirements reviewed.

## 2016-03-06 DIAGNOSIS — M8580 Other specified disorders of bone density and structure, unspecified site: Secondary | ICD-10-CM | POA: Diagnosis not present

## 2016-03-06 DIAGNOSIS — C9110 Chronic lymphocytic leukemia of B-cell type not having achieved remission: Secondary | ICD-10-CM | POA: Diagnosis not present

## 2016-03-06 DIAGNOSIS — E2839 Other primary ovarian failure: Secondary | ICD-10-CM | POA: Diagnosis not present

## 2016-03-06 DIAGNOSIS — M85862 Other specified disorders of bone density and structure, left lower leg: Secondary | ICD-10-CM | POA: Diagnosis not present

## 2016-03-06 DIAGNOSIS — M810 Age-related osteoporosis without current pathological fracture: Secondary | ICD-10-CM | POA: Diagnosis not present

## 2016-03-12 ENCOUNTER — Telehealth: Payer: Self-pay | Admitting: Internal Medicine

## 2016-03-12 DIAGNOSIS — M810 Age-related osteoporosis without current pathological fracture: Secondary | ICD-10-CM

## 2016-03-12 NOTE — Telephone Encounter (Signed)
Please start PA for Prolia  Patient has history of osteoproris ,  DEXA done 02/26/16 at [facility],  And prior fractures.  Alendronate C/i due to age.   Thank you Kalman Shan,  TT

## 2016-03-16 NOTE — Telephone Encounter (Signed)
I have electronically submitted pt's info for Prolia insurance verification and will notify you once I have a response. Thank you. °

## 2016-04-14 NOTE — Telephone Encounter (Signed)
Please inform patient and order the medication if she agrees to go forward

## 2016-04-14 NOTE — Telephone Encounter (Signed)
Spoke with the patient, agreed to the injection, ordered in dimension 21 and scheduled for next week. Thanks

## 2016-04-14 NOTE — Telephone Encounter (Signed)
I have finally rec'd Lisa Figueroa's Pharmacist, community for [employer].  My apologies for the delay but they kept sending me an incorrect verification.  Lisa Figueroa will have an estimated responsibility of 0% for her injection.  Please make pt aware this is an estimate and we will not know an exact amt until insurance(s) has/have paid.  I have sent a copy of the summary of benefits to be scanned into pt's chart.    Once pt recs injection, please let me know actual injection date so I can update the Prolia portal.  If you have any questions, please let me know.  Thank you!

## 2016-04-15 NOTE — Telephone Encounter (Signed)
Injection is in the Fridge on Pod 1

## 2016-04-22 ENCOUNTER — Ambulatory Visit (INDEPENDENT_AMBULATORY_CARE_PROVIDER_SITE_OTHER): Payer: Medicare Other

## 2016-04-22 DIAGNOSIS — M810 Age-related osteoporosis without current pathological fracture: Secondary | ICD-10-CM

## 2016-04-22 MED ORDER — DENOSUMAB 60 MG/ML ~~LOC~~ SOLN
60.0000 mg | Freq: Once | SUBCUTANEOUS | Status: AC
  Administered 2016-04-22: 60 mg via SUBCUTANEOUS

## 2016-04-22 NOTE — Telephone Encounter (Signed)
Shot given today, 04/22/16 in left arm.  thanks

## 2016-04-22 NOTE — Progress Notes (Signed)
Patient came in for her Prolia injection.  Received in Left arm [place]. Patient tolerated well.

## 2016-04-29 ENCOUNTER — Encounter: Payer: Self-pay | Admitting: Family Medicine

## 2016-04-29 ENCOUNTER — Inpatient Hospital Stay (HOSPITAL_BASED_OUTPATIENT_CLINIC_OR_DEPARTMENT_OTHER): Payer: Medicare Other | Admitting: Family Medicine

## 2016-04-29 ENCOUNTER — Inpatient Hospital Stay: Payer: Medicare Other | Attending: Oncology

## 2016-04-29 VITALS — BP 146/84 | HR 69 | Temp 96.7°F | Wt 164.9 lb

## 2016-04-29 DIAGNOSIS — Z87891 Personal history of nicotine dependence: Secondary | ICD-10-CM

## 2016-04-29 DIAGNOSIS — Z808 Family history of malignant neoplasm of other organs or systems: Secondary | ICD-10-CM | POA: Diagnosis not present

## 2016-04-29 DIAGNOSIS — Z85850 Personal history of malignant neoplasm of thyroid: Secondary | ICD-10-CM

## 2016-04-29 DIAGNOSIS — R5383 Other fatigue: Secondary | ICD-10-CM | POA: Insufficient documentation

## 2016-04-29 DIAGNOSIS — Z86010 Personal history of colonic polyps: Secondary | ICD-10-CM | POA: Diagnosis not present

## 2016-04-29 DIAGNOSIS — Z79899 Other long term (current) drug therapy: Secondary | ICD-10-CM | POA: Insufficient documentation

## 2016-04-29 DIAGNOSIS — Z8619 Personal history of other infectious and parasitic diseases: Secondary | ICD-10-CM

## 2016-04-29 DIAGNOSIS — C9110 Chronic lymphocytic leukemia of B-cell type not having achieved remission: Secondary | ICD-10-CM

## 2016-04-29 DIAGNOSIS — E785 Hyperlipidemia, unspecified: Secondary | ICD-10-CM | POA: Insufficient documentation

## 2016-04-29 DIAGNOSIS — Z803 Family history of malignant neoplasm of breast: Secondary | ICD-10-CM | POA: Insufficient documentation

## 2016-04-29 DIAGNOSIS — R531 Weakness: Secondary | ICD-10-CM | POA: Diagnosis not present

## 2016-04-29 DIAGNOSIS — I10 Essential (primary) hypertension: Secondary | ICD-10-CM

## 2016-04-29 LAB — COMPREHENSIVE METABOLIC PANEL
ALBUMIN: 4.7 g/dL (ref 3.5–5.0)
ALK PHOS: 51 U/L (ref 38–126)
ALT: 14 U/L (ref 14–54)
AST: 20 U/L (ref 15–41)
Anion gap: 7 (ref 5–15)
BILIRUBIN TOTAL: 0.9 mg/dL (ref 0.3–1.2)
BUN: 13 mg/dL (ref 6–20)
CO2: 30 mmol/L (ref 22–32)
Calcium: 8.9 mg/dL (ref 8.9–10.3)
Chloride: 91 mmol/L — ABNORMAL LOW (ref 101–111)
Creatinine, Ser: 0.6 mg/dL (ref 0.44–1.00)
GFR calc Af Amer: >60 mL/min (ref >60)
GFR calc non Af Amer: >60 mL/min (ref >60)
GLUCOSE: 97 mg/dL (ref 65–99)
POTASSIUM: 4 mmol/L (ref 3.5–5.1)
Sodium: 128 mmol/L — ABNORMAL LOW (ref 135–145)
TOTAL PROTEIN: 8 g/dL (ref 6.5–8.1)

## 2016-04-29 LAB — CBC WITH DIFFERENTIAL/PLATELET
BASOS ABS: 0.3 10*3/uL — AB (ref 0–0.1)
BASOS PCT: 1 %
Eosinophils Absolute: 0.1 10*3/uL (ref 0–0.7)
Eosinophils Relative: 0 %
HEMATOCRIT: 43.3 % (ref 35.0–47.0)
HEMOGLOBIN: 14.3 g/dL (ref 12.0–16.0)
Lymphocytes Relative: 83 %
Lymphs Abs: 28 10*3/uL — ABNORMAL HIGH (ref 1.0–3.6)
MCH: 27.8 pg (ref 26.0–34.0)
MCHC: 33.1 g/dL (ref 32.0–36.0)
MCV: 84.1 fL (ref 80.0–100.0)
Monocytes Absolute: 0.9 10*3/uL (ref 0.2–0.9)
Monocytes Relative: 3 %
NEUTROS PCT: 13 %
Neutro Abs: 4.5 10*3/uL (ref 1.4–6.5)
Platelets: 199 10*3/uL (ref 150–440)
RBC: 5.15 MIL/uL (ref 3.80–5.20)
RDW: 14.1 % (ref 11.5–14.5)
WBC: 33.9 10*3/uL — AB (ref 3.6–11.0)

## 2016-04-29 LAB — LACTATE DEHYDROGENASE: LDH: 135 U/L (ref 98–192)

## 2016-04-29 NOTE — Progress Notes (Signed)
[place]  [phone]  Fax:([phone]) [place] DOB: [birth year]  MR#: [id number]  PYP#:[id number]  Patient Care Team: Crecencio Mc, MD as PCP - General (Internal Medicine) [name], MD as Consulting Physician (General Surgery)  CHIEF COMPLAINT:  Chief Complaint  Patient presents with  . CLL    INTERVAL HISTORY:  Patient is here for further follow-up and treatment consideration regarding CLL. Patient overall reports feeling very well except for some malaise and fatigue. She continues to have some palpable lymphadenopathy. Most recent mammogram in May 2016 revealed some abnormality of lymph nodes which is known and expected. She overall denies any chills, fever, night sweats, weight loss, or pain.  REVIEW OF SYSTEMS:   Review of Systems  Constitutional: Positive for malaise/fatigue. Negative for fever, chills, weight loss and diaphoresis.  HENT: Negative.   Eyes: Negative.   Respiratory: Negative for cough, hemoptysis, sputum production, shortness of breath and wheezing.   Cardiovascular: Negative for chest pain, palpitations, orthopnea, claudication, leg swelling and PND.  Gastrointestinal: Negative for heartburn, nausea, vomiting, abdominal pain, diarrhea, constipation, blood in stool and melena.  Genitourinary: Negative.   Musculoskeletal: Negative.   Skin: Negative.   Neurological: Negative for dizziness, tingling, focal weakness, seizures and weakness.  Endo/Heme/Allergies: Does not bruise/bleed easily.  Psychiatric/Behavioral: Negative for depression. The patient is not nervous/anxious and does not have insomnia.     As per HPI. Otherwise, a complete review of systems is negatve.  ONCOLOGY HISTORY: Oncology History    1. Monoclonal population of lymphocytes.  Lymphoproliferative disease. 82 year old lady who had an abnormal mammogram mid axillary lymphadenopathy core biopsies which he suggested a  lymphoproliferative disease positive staining for CD5 and CD20.     CLL (chronic lymphocytic leukemia) ([place]) (Resolved)   11/18/2014 Initial Diagnosis CLL (chronic lymphocytic leukemia)    PAST MEDICAL HISTORY: Past Medical History  Diagnosis Date  . Thyroid cancer ([place]) 1998  . History of colon polyps   . History of shingles   . Post herpetic neuralgia   . Hypertension   . Hyperlipidemia   . Thyroid cancer ([place])   . CLL (chronic lymphocytic leukemia) ([place])   . Shingles     PAST SURGICAL HISTORY: Past Surgical History  Procedure Laterality Date  . Cholecystectomy    . Abdominal hysterectomy    . Hemorrhoid surgery    . Total thyroidectomy  1998  . Appendectomy    . Breast surgery Right     cyst excision  . Cardiac catheterization  05/18/2008    At [facility]: mild 30% stenosis in LCX. normal EF.    . Cataract extraction w/phaco Left 06/04/2015    Procedure: CATARACT EXTRACTION PHACO AND INTRAOCULAR LENS PLACEMENT (IOC);  Surgeon: Birder Robson, MD;  Location: [facility] ORS;  Service: Ophthalmology;  Laterality: Left;  [place] 00:34.1 AP% 23.0 CDE 7.85    FAMILY HISTORY Family History  Problem Relation Age of Onset  . Cancer Brother     prostate and bone  . Cancer Maternal Aunt     breast  . Cancer Cousin     breast - maternal side  . Heart disease Mother   . Heart disease Father     GYNECOLOGIC HISTORY:  No LMP recorded. Patient has had a hysterectomy.     ADVANCED DIRECTIVES:    HEALTH MAINTENANCE: Social History  Substance Use Topics  . Smoking status: Former Smoker -- 1.00 packs/day for 20 years    Quit date: 08/08/1984  .  Smokeless tobacco: None  . Alcohol Use: 0.0 oz/week    0 Standard drinks or equivalent per week     Comment: wine glass daily     Colonoscopy:  PAP:  Bone density:March 2017  Mammogram:May 2016  Allergies  Allergen Reactions  . Penicillin G Swelling  . Prednisone Other (See Comments)    crys and can't sleep crys and can't sleep     Current Outpatient Prescriptions  Medication Sig Dispense Refill  . acetaminophen (TYLENOL) 500 MG tablet Take 500 mg by mouth daily.    . Cholecalciferol (HM VITAMIN D3) 2000 UNITS CAPS Take 1 capsule by mouth daily.    Marland Kitchen gabapentin (NEURONTIN) 400 MG capsule Take 1 capsule (400 mg total) by mouth 2 (two) times daily. 270 capsule 0  . levothyroxine (SYNTHROID, LEVOTHROID) 150 MCG tablet Take 1 tablet (150 mcg total) by mouth daily. 90 tablet 0  . losartan-hydrochlorothiazide (HYZAAR) 100-12.5 MG tablet Take 1 tablet by mouth daily. 90 tablet 0  . simvastatin (ZOCOR) 20 MG tablet Take 1 tablet (20 mg total) by mouth daily. 90 tablet 1   No current facility-administered medications for this visit.    OBJECTIVE: BP 146/84 mmHg  Pulse 69  Temp(Src) 96.7 F (35.9 C) (Tympanic)  Wt 164 lb 14.5 oz (74.8 kg)   Body mass index is 31.17 kg/(m^2).    ECOG FS:1 - Symptomatic but completely ambulatory  General: Well-developed, well-nourished, no acute distress. Eyes: Pink conjunctiva, anicteric sclera. HEENT: Normocephalic, moist mucous membranes, clear oropharnyx. Lungs: Clear to auscultation bilaterally. Heart: Regular rate and rhythm. No rubs, murmurs, or gallops. Abdomen: Soft, nontender, nondistended. No organomegaly noted, normoactive bowel sounds. Breast: Breast palpated in a circular manner in the sitting and supine positions.  No masses or fullness palpated.  Axilla palpated in both positions with no masses or fullness palpated.  Musculoskeletal: 1+ lower extremity edema, cyanosis, or clubbing. Neuro: Alert, answering all questions appropriately. Cranial nerves grossly intact. Skin: No rashes or petechiae noted. Psych: Normal affect. Lymphatics: Small palpable axillary lymph node as well as cervical lymph node, unchanged.  LAB RESULTS:  Appointment on 04/29/2016  Component Date Value Ref Range Status  . WBC 04/29/2016 33.9* 3.6 - 11.0 K/uL Final  . RBC 04/29/2016 5.15  3.80 -  5.20 MIL/uL Final  . Hemoglobin 04/29/2016 14.3  12.0 - 16.0 g/dL Final  . HCT 04/29/2016 43.3  35.0 - 47.0 % Final  . MCV 04/29/2016 84.1  80.0 - 100.0 fL Final  . MCH 04/29/2016 27.8  26.0 - 34.0 pg Final  . MCHC 04/29/2016 33.1  32.0 - 36.0 g/dL Final  . RDW 04/29/2016 14.1  11.5 - 14.5 % Final  . Platelets 04/29/2016 199  150 - 440 K/uL Final  . Neutrophils Relative % 04/29/2016 13   Final  . Neutro Abs 04/29/2016 4.5  1.4 - 6.5 K/uL Final  . Lymphocytes Relative 04/29/2016 83   Final  . Lymphs Abs 04/29/2016 28.0* 1.0 - 3.6 K/uL Final  . Monocytes Relative 04/29/2016 3   Final  . Monocytes Absolute 04/29/2016 0.9  0.2 - 0.9 K/uL Final  . Eosinophils Relative 04/29/2016 0   Final  . Eosinophils Absolute 04/29/2016 0.1  0 - 0.7 K/uL Final  . Basophils Relative 04/29/2016 1   Final  . Basophils Absolute 04/29/2016 0.3* 0 - 0.1 K/uL Final  . Sodium 04/29/2016 128* 135 - 145 mmol/L Final  . Potassium 04/29/2016 4.0  3.5 - 5.1 mmol/L Final  . Chloride 04/29/2016 91*  101 - 111 mmol/L Final  . CO2 04/29/2016 30  22 - 32 mmol/L Final  . Glucose, Bld 04/29/2016 97  65 - 99 mg/dL Final  . BUN 04/29/2016 13  6 - 20 mg/dL Final  . Creatinine, Ser 04/29/2016 0.60  0.44 - 1.00 mg/dL Final  . Calcium 04/29/2016 8.9  8.9 - 10.3 mg/dL Final  . Total Protein 04/29/2016 8.0  6.5 - 8.1 g/dL Final  . Albumin 04/29/2016 4.7  3.5 - 5.0 g/dL Final  . AST 04/29/2016 20  15 - 41 U/L Final  . ALT 04/29/2016 14  14 - 54 U/L Final  . Alkaline Phosphatase 04/29/2016 51  38 - 126 U/L Final  . Total Bilirubin 04/29/2016 0.9  0.3 - 1.2 mg/dL Final  . GFR calc non Af Amer 04/29/2016 >60  >60 mL/min Final  . GFR calc Af Amer 04/29/2016 >60  >60 mL/min Final   Comment: (NOTE) The eGFR has been calculated using the CKD EPI equation. This calculation has not been validated in all clinical situations. eGFR's persistently <60 mL/min signify possible Chronic Kidney Disease.   . Anion gap 04/29/2016 7  5 - 15  Final  . LDH 04/29/2016 135  98 - 192 U/L Final    STUDIES: No results found.  ASSESSMENT:  Chronic lymphocytic leukemia.  PLAN:   1.CLL. Overall patient is doing very well. Hemoglobin and platelet counts remained stable and within normal limits. White count has increased to 33.9, and patient has persistent cervical and axillary lymphadenopathy, unchanged.   we will continue with routine follow-up in approximately 6 months. Patient has specifically requested to see Dr. Grayland Ormond at that time.  Patient expressed understanding and was in agreement with this plan. She also understands that She can call clinic at any time with any questions, concerns, or complaints.   Dr. Oliva Bustard was available for consultation and review of plan of care for this patient.   Evlyn Kanner, NP   04/29/2016 11:54 AM

## 2016-04-30 ENCOUNTER — Other Ambulatory Visit: Payer: Self-pay

## 2016-04-30 MED ORDER — LEVOTHYROXINE SODIUM 150 MCG PO TABS: 150.0000 ug | ORAL_TABLET | Freq: Every day | ORAL | Status: DC

## 2016-04-30 MED ORDER — LOSARTAN POTASSIUM-HCTZ 100-12.5 MG PO TABS: 1.0000 | ORAL_TABLET | Freq: Every day | ORAL | Status: DC

## 2016-04-30 NOTE — Addendum Note (Signed)
Addended by: Milta Deiters on: 04/30/2016 01:31 PM   Modules accepted: Orders

## 2016-05-13 ENCOUNTER — Encounter: Payer: Self-pay | Admitting: Family Medicine

## 2016-05-13 ENCOUNTER — Ambulatory Visit (INDEPENDENT_AMBULATORY_CARE_PROVIDER_SITE_OTHER): Payer: Medicare Other | Admitting: Family Medicine

## 2016-05-13 VITALS — BP 112/76 | HR 81 | Temp 97.9°F | Ht 61.0 in | Wt 164.6 lb

## 2016-05-13 DIAGNOSIS — J209 Acute bronchitis, unspecified: Secondary | ICD-10-CM | POA: Diagnosis not present

## 2016-05-13 MED ORDER — DOXYCYCLINE HYCLATE 100 MG PO TABS: 100.0000 mg | ORAL_TABLET | Freq: Two times a day (BID) | ORAL | Status: DC

## 2016-05-13 MED ORDER — HYDROCODONE-HOMATROPINE 5-1.5 MG/5ML PO SYRP: 5.0000 mL | ORAL_SOLUTION | Freq: Three times a day (TID) | ORAL | Status: DC | PRN

## 2016-05-13 NOTE — Progress Notes (Signed)
Pre visit review using our clinic review tool, if applicable. No additional management support is needed unless otherwise documented below in the visit note. 

## 2016-05-13 NOTE — Progress Notes (Signed)
Patient ID: Lisa Figueroa, female   DOB: 1932/04/01, 82 y.o.   MRN: [id number]  Tommi Rumps, MD Phone: [phone]  Lisa Figueroa is a 82 y.o. female who presents today for same-day visit.  Patient notes onset of symptoms Sunday night. Noted started with sore throat and rhinorrhea. Some frontal sinus rash or headache. Mildly productive cough. No shortness of breath or fevers. Note she does not feel well. No chest pain. No sick contacts. She took some over-the-counter medication recommended by the pharmacist that may have helped some the first time she took it. She does have a history of CLL. No numbness or weakness.  PMH: Former smoker ROS see history of present illness  Objective  Physical Exam Filed Vitals:   05/13/16 1400  BP: 112/76  Pulse: 81  Temp: 97.9 F (36.6 C)    BP Readings from Last 3 Encounters:  05/13/16 112/76  04/29/16 146/84  02/28/16 128/74   Wt Readings from Last 3 Encounters:  05/13/16 164 lb 9.6 oz (74.662 kg)  04/29/16 164 lb 14.5 oz (74.8 kg)  02/28/16 166 lb (75.297 kg)    Physical Exam  Constitutional: She is well-developed, well-nourished, and in no distress.  HENT:  Head: Normocephalic and atraumatic.  Right Ear: External ear normal.  Left Ear: External ear normal.  Mouth/Throat: Oropharynx is clear and moist. No oropharyngeal exudate.  Normal TMs bilaterally  Eyes: Conjunctivae are normal. Pupils are equal, round, and reactive to light.  Neck: Neck supple.  Cardiovascular: Normal rate, regular rhythm and normal heart sounds.   Pulmonary/Chest: Effort normal. No respiratory distress. She has no wheezes. She has no rales.  Mildly coarse expiratory breath sounds bilaterally  Lymphadenopathy:    She has no cervical adenopathy.  Neurological: She is alert.  CN 2-12 intact, 5/5 strength in bilateral biceps, triceps, grip, quads, hamstrings, plantar and dorsiflexion, sensation to light touch intact in bilateral UE and LE, normal gait,  2+ patellar reflexes  Skin: Skin is warm and dry. She is not diaphoretic.     Assessment/Plan: Please see individual problem list.  Acute bronchitis Symptoms most consistent with bronchitis in setting of coarse breath sounds and mildly productive cough. Could also be related to sinusitis given frontal sinus pressure and headache. No other focal findings on exam. She is neurologically intact. Vital signs are stable. Given that she does have a history of CLL I have a low threshold to treat with an antibiotic for a bacterial bronchitis or sinusitis. We will start on doxycycline. Hycodan for cough. She will continue to monitor. If no improvement in the next 2-3 days she will follow-up. Given return precautions.    No orders of the defined types were placed in this encounter.    Meds ordered this encounter  Medications  . doxycycline (VIBRA-TABS) 100 MG tablet    Sig: Take 1 tablet (100 mg total) by mouth 2 (two) times daily.    Dispense:  14 tablet    Refill:  0  . HYDROcodone-homatropine (HYCODAN) 5-1.5 MG/5ML syrup    Sig: Take 5 mLs by mouth every 8 (eight) hours as needed for cough.    Dispense:  120 mL    Refill:  0    Tommi Rumps, MD [place]

## 2016-05-13 NOTE — Patient Instructions (Signed)
Nice to see you. Your symptoms are likely related to a sinus infection versus bronchitis. We will treat you with doxycycline. If you do not improve in the next 2-3 days please let us know so we can get you back in the office for follow-up. If you develop chest pain, shortness of breath, cough productive of blood, fevers, or any new or changing symptoms please seek medical attention.

## 2016-05-13 NOTE — Assessment & Plan Note (Signed)
Symptoms most consistent with bronchitis in setting of coarse breath sounds and mildly productive cough. Could also be related to sinusitis given frontal sinus pressure and headache. No other focal findings on exam. She is neurologically intact. Vital signs are stable. Given that she does have a history of CLL I have a low threshold to treat with an antibiotic for a bacterial bronchitis or sinusitis. We will start on doxycycline. Hycodan for cough. She will continue to monitor. If no improvement in the next 2-3 days she will follow-up. Given return precautions.

## 2016-05-20 ENCOUNTER — Telehealth: Payer: Self-pay | Admitting: *Deleted

## 2016-05-20 NOTE — Telephone Encounter (Signed)
Spoke to patient. Cough has not improved. Advised patient per Dr. Caryl Bis last [place] not if no improvements to follow up. Patient says she will check with her sister to see when she could bring her in then call back and schedule accordingly.

## 2016-05-20 NOTE — Telephone Encounter (Signed)
Patient stated that she still has a cough, with green/brown mucus. She will take her last pill today of the medication prescribed. She questioned if she should have a Xray ,or anther Rx. [phone]

## 2016-05-22 ENCOUNTER — Telehealth: Payer: Self-pay | Admitting: Family Medicine

## 2016-05-22 ENCOUNTER — Ambulatory Visit (INDEPENDENT_AMBULATORY_CARE_PROVIDER_SITE_OTHER)
Admission: RE | Admit: 2016-05-22 | Discharge: 2016-05-22 | Disposition: A | Discharge status: Home / Self Care | Payer: Medicare Other | Source: Ambulatory Visit | Attending: Family Medicine | Admitting: Family Medicine

## 2016-05-22 ENCOUNTER — Telehealth: Payer: Self-pay | Admitting: *Deleted

## 2016-05-22 ENCOUNTER — Ambulatory Visit (INDEPENDENT_AMBULATORY_CARE_PROVIDER_SITE_OTHER): Payer: Medicare Other | Admitting: Family Medicine

## 2016-05-22 ENCOUNTER — Encounter: Payer: Self-pay | Admitting: Family Medicine

## 2016-05-22 VITALS — BP 120/84 | HR 72 | Temp 97.8°F | Ht 61.0 in | Wt 163.2 lb

## 2016-05-22 DIAGNOSIS — R05 Cough: Secondary | ICD-10-CM

## 2016-05-22 DIAGNOSIS — R0602 Shortness of breath: Secondary | ICD-10-CM | POA: Diagnosis not present

## 2016-05-22 DIAGNOSIS — R059 Cough, unspecified: Secondary | ICD-10-CM

## 2016-05-22 DIAGNOSIS — J209 Acute bronchitis, unspecified: Secondary | ICD-10-CM | POA: Diagnosis not present

## 2016-05-22 LAB — COMPREHENSIVE METABOLIC PANEL
ALBUMIN: 4.2 g/dL (ref 3.5–5.2)
ALT: 10 U/L (ref 0–35)
AST: 17 U/L (ref 0–37)
Alkaline Phosphatase: 53 U/L (ref 39–117)
BILIRUBIN TOTAL: 0.6 mg/dL (ref 0.2–1.2)
BUN: 12 mg/dL (ref 6–23)
CALCIUM: 9.6 mg/dL (ref 8.4–10.5)
CO2: 34 meq/L — AB (ref 19–32)
CREATININE: 0.69 mg/dL (ref 0.40–1.20)
Chloride: 90 mEq/L — ABNORMAL LOW (ref 96–112)
GFR: 86.64 mL/min (ref >60.00)
Glucose, Bld: 105 mg/dL — ABNORMAL HIGH (ref 70–99)
Potassium: 4.6 mEq/L (ref 3.5–5.1)
SODIUM: 129 meq/L — AB (ref 135–145)
Total Protein: 7 g/dL (ref 6.0–8.3)

## 2016-05-22 LAB — CBC WITH DIFFERENTIAL/PLATELET
Basophils Absolute: 0.7 10*3/uL — ABNORMAL HIGH (ref 0.0–0.1)
Basophils Relative: 1.5 % (ref 0.0–3.0)
EOS ABS: 0.1 10*3/uL (ref 0.0–0.7)
Eosinophils Relative: 0.3 % (ref 0.0–5.0)
HEMATOCRIT: 44 % (ref 36.0–46.0)
Hemoglobin: 14.4 g/dL (ref 12.0–15.0)
LYMPHS PCT: 79.4 % — AB (ref 12.0–46.0)
Lymphs Abs: 34.8 10*3/uL — ABNORMAL HIGH (ref 0.7–4.0)
MCHC: 32.7 g/dL (ref 30.0–36.0)
MCV: 85.3 fl (ref 78.0–100.0)
MONO ABS: 1.3 10*3/uL — AB (ref 0.1–1.0)
Monocytes Relative: 2.9 % — ABNORMAL LOW (ref 3.0–12.0)
NEUTROS ABS: 7 10*3/uL (ref 1.4–7.7)
NEUTROS PCT: 15.9 % — AB (ref 43.0–77.0)
PLATELETS: 262 10*3/uL (ref 150.0–400.0)
RBC: 5.16 Mil/uL — ABNORMAL HIGH (ref 3.87–5.11)
RDW: 14.2 % (ref 11.5–15.5)
WBC: 43.8 10*3/uL (ref 4.0–10.5)

## 2016-05-22 MED ORDER — HYDROCODONE-HOMATROPINE 5-1.5 MG/5ML PO SYRP: 5.0000 mL | ORAL_SOLUTION | Freq: Three times a day (TID) | ORAL | Status: DC | PRN

## 2016-05-22 MED ORDER — ALBUTEROL SULFATE (2.5 MG/3ML) 0.083% IN NEBU
2.5000 mg | INHALATION_SOLUTION | Freq: Once | RESPIRATORY_TRACT | Status: AC
  Administered 2016-05-22: 2.5 mg via RESPIRATORY_TRACT

## 2016-05-22 MED ORDER — ALBUTEROL SULFATE HFA 108 (90 BASE) MCG/ACT IN AERS: 2.0000 | INHALATION_SPRAY | Freq: Four times a day (QID) | RESPIRATORY_TRACT | Status: DC | PRN

## 2016-05-22 MED ORDER — LEVOFLOXACIN 750 MG PO TABS: 750.0000 mg | ORAL_TABLET | Freq: Every day | ORAL | Status: DC

## 2016-05-22 NOTE — Telephone Encounter (Signed)
This was taken care of by the [place]. thanks

## 2016-05-22 NOTE — Progress Notes (Signed)
Patient ID: Lisa Figueroa, female   DOB: [birth year], 82 y.o.   MRN: [id number]  Tommi Rumps, MD Phone: [phone]  Lisa Figueroa is a 82 y.o. female who presents today for follow-up.  Patient was seen 9 days ago with likely sinusitis and bronchitis. She was started on doxycycline to cover both of these. She notes she has improved a little bit. She notes that she was advised to follow-up within 2-3 days if not improving significantly and she just did not do this thinking that she would improve over time. She notes continued cough that is productive of greenish mucus. She does have some shortness of breath when walking around though none at rest. No chest pain or fevers. Some ear pain with this. Does still have frontal sinus drainage and pressure. Has completed the course of doxycycline.  PMH: Former smoker   ROS see history of present illness  Objective  Physical Exam Filed Vitals:   05/22/16 0826  BP: 120/84  Pulse: 72  Temp: 97.8 F (36.6 C)    BP Readings from Last 3 Encounters:  05/22/16 120/84  05/13/16 112/76  04/29/16 146/84   Wt Readings from Last 3 Encounters:  05/22/16 163 lb 3.2 oz (74.027 kg)  05/13/16 164 lb 9.6 oz (74.662 kg)  04/29/16 164 lb 14.5 oz (74.8 kg)    Physical Exam  Constitutional: No distress.  HENT:  Head: Normocephalic and atraumatic.  Right Ear: External ear normal.  Left Ear: External ear normal.  Mouth/Throat: Oropharynx is clear and moist. No oropharyngeal exudate.  Normal TMs bilaterally, frontal sinuses bilaterally tender to percussion  Eyes: Conjunctivae are normal. Pupils are equal, round, and reactive to light.  Neck: Neck supple.  Cardiovascular: Normal rate, regular rhythm and normal heart sounds.   Pulmonary/Chest: Effort normal. No respiratory distress. She has wheezes (expiratory wheezes throughout). She has no rales.  Mildly coarse breath sounds throughout  Lymphadenopathy:    She has no cervical adenopathy.    Neurological: She is alert. Gait normal.  CN 2-12 intact, 5/5 strength in bilateral biceps, triceps, grip, quads, hamstrings, plantar and dorsiflexion, sensation to light touch intact in bilateral UE and LE, normal gait, 2+ patellar reflexes  Skin: Skin is warm and dry. She is not diaphoretic.   Patient was given an albuterol nebulizer treatment in the office and reported improvement in her breathing and cough. Wheezes were less. No coarse breath sounds following nebulizer treatment. Oxygen remained at 91% after nebulizer.  Assessment/Plan: Please see individual problem list.  Acute bronchitis Patient with symptoms most consistent with sinusitis and bronchitis. Given lack of improvement with doxycycline could be concern for pneumonia as well. Oxygen is borderline low and I did discuss evaluation in the emergency room for this though the patient declined. Given lack of improvement on doxycycline we will start on Levaquin to cover for sinusitis and possible underlying pneumonia. She will be treated with an albuterol inhaler as well. Hycodan for cough. She declines prednisone given prior allergy of crying and being unable to sleep. Given her persistent symptoms we will check a CMP and CBC. She'll continue to monitor. She's given return precautions. She'll follow-up with me early next week for recheck.    Orders Placed This Encounter  Procedures  . DG Chest 2 View    Standing Status: Future     Number of Occurrences: 1     Standing Expiration Date: 07/22/2017    Order Specific Question:  Reason for Exam (SYMPTOM  OR DIAGNOSIS REQUIRED)  Answer:  Cough and mild exertional shortness of breath, wheezing    Order Specific Question:  Preferred imaging location?    Answer:  [place] ([facility])  . CBC w/Diff    Meds ordered this encounter  Medications  . albuterol (PROVENTIL HFA;VENTOLIN HFA) 108 (90 Base) MCG/ACT inhaler    Sig: Inhale 2 puffs into the lungs every 6 (six)  hours as needed for wheezing or shortness of breath.    Dispense:  1 Inhaler    Refill:  0  . levofloxacin (LEVAQUIN) 750 MG tablet    Sig: Take 1 tablet (750 mg total) by mouth daily.    Dispense:  5 tablet    Refill:  0  . HYDROcodone-homatropine (HYCODAN) 5-1.5 MG/5ML syrup    Sig: Take 5 mLs by mouth every 8 (eight) hours as needed for cough.    Dispense:  120 mL    Refill:  0  . albuterol (PROVENTIL) (2.5 MG/3ML) 0.083% nebulizer solution 2.5 mg    Sig:     Tommi Rumps, MD [place]

## 2016-05-22 NOTE — Telephone Encounter (Signed)
Notified patient of labs and X ray results. Patient verbalized understanding and will have labs drawn at appointment on Tuesday.

## 2016-05-22 NOTE — Telephone Encounter (Signed)
[facility] called with white count of 43.8

## 2016-05-22 NOTE — Telephone Encounter (Signed)
Please call the patient and let them know that her white blood cell count is up from previously. This may be related to her current infection. This could also be related to her CLL. She has mildly low sodium that is slightly improved from last time this was checked. She needs to stay well hydrated. We will need to recheck this next time. Her chest x-ray did not reveal any pneumonia. I'm going to forward her lab results to her oncologist to see if she needs to follow-up with them sooner than planned. We will plan on rechecking this next week. Thanks.

## 2016-05-22 NOTE — Assessment & Plan Note (Addendum)
Patient with symptoms most consistent with sinusitis and bronchitis. Given lack of improvement with doxycycline could be concern for pneumonia as well. Oxygen is borderline low and I did discuss evaluation in the emergency room for this though the patient declined. Given lack of improvement on doxycycline we will start on Levaquin to cover for sinusitis and possible underlying pneumonia. She will be treated with an albuterol inhaler as well. Hycodan for cough. She declines prednisone given prior allergy of crying and being unable to sleep. Given her persistent symptoms we will check a CMP and CBC. She'll continue to monitor. She's given return precautions. She'll follow-up with me early next week for recheck.

## 2016-05-22 NOTE — Telephone Encounter (Signed)
Noted  

## 2016-05-22 NOTE — Telephone Encounter (Signed)
Patient requested to be called when blood works results and Xray results are in.

## 2016-05-22 NOTE — Patient Instructions (Addendum)
Nice to see you. Please go get the chest x-ray. We will treat your symptoms with Levaquin one pill daily. You should use the albuterol inhaler 2 puffs every 6 hours for the next 2 days and then every 6 hours as needed after that. If you develop chest pain, shortness of breath, palpitations, cough productive of blood, or any new or changing symptoms please seek medical attention. I will see you back on Tuesday.

## 2016-05-22 NOTE — Progress Notes (Signed)
Pre visit review using our clinic review tool, if applicable. No additional management support is needed unless otherwise documented below in the visit note. 

## 2016-05-26 ENCOUNTER — Encounter: Payer: Self-pay | Admitting: Family Medicine

## 2016-05-26 ENCOUNTER — Ambulatory Visit (INDEPENDENT_AMBULATORY_CARE_PROVIDER_SITE_OTHER): Payer: Medicare Other | Admitting: Family Medicine

## 2016-05-26 VITALS — BP 112/68 | HR 77 | Temp 97.7°F | Ht 61.0 in | Wt 159.2 lb

## 2016-05-26 DIAGNOSIS — C9110 Chronic lymphocytic leukemia of B-cell type not having achieved remission: Secondary | ICD-10-CM | POA: Diagnosis not present

## 2016-05-26 DIAGNOSIS — J209 Acute bronchitis, unspecified: Secondary | ICD-10-CM | POA: Diagnosis not present

## 2016-05-26 DIAGNOSIS — E871 Hypo-osmolality and hyponatremia: Secondary | ICD-10-CM

## 2016-05-26 DIAGNOSIS — D72829 Elevated white blood cell count, unspecified: Secondary | ICD-10-CM | POA: Diagnosis not present

## 2016-05-26 LAB — CBC WITH DIFFERENTIAL/PLATELET
BASOS ABS: 0.1 10*3/uL (ref 0.0–0.1)
Basophils Relative: 0.2 % (ref 0.0–3.0)
Eosinophils Absolute: 0.1 10*3/uL (ref 0.0–0.7)
Eosinophils Relative: 0.2 % (ref 0.0–5.0)
HCT: 41.1 % (ref 36.0–46.0)
HEMOGLOBIN: 13.6 g/dL (ref 12.0–15.0)
LYMPHS ABS: 21.7 10*3/uL — AB (ref 0.7–4.0)
Lymphocytes Relative: 76.1 % — ABNORMAL HIGH (ref 12.0–46.0)
MCHC: 33.2 g/dL (ref 30.0–36.0)
MCV: 84.3 fl (ref 78.0–100.0)
MONO ABS: 0.9 10*3/uL (ref 0.1–1.0)
MONOS PCT: 3.2 % (ref 3.0–12.0)
NEUTROS PCT: 20.3 % — AB (ref 43.0–77.0)
Neutro Abs: 5.8 10*3/uL (ref 1.4–7.7)
Platelets: 220 10*3/uL (ref 150.0–400.0)
RBC: 4.88 Mil/uL (ref 3.87–5.11)
RDW: 14.3 % (ref 11.5–15.5)

## 2016-05-26 LAB — BASIC METABOLIC PANEL
BUN: 14 mg/dL (ref 6–23)
CALCIUM: 9.2 mg/dL (ref 8.4–10.5)
CHLORIDE: 93 meq/L — AB (ref 96–112)
CO2: 32 meq/L (ref 19–32)
CREATININE: 0.9 mg/dL (ref 0.40–1.20)
GFR: 63.76 mL/min (ref >60.00)
Glucose, Bld: 71 mg/dL (ref 70–99)
Potassium: 4.6 mEq/L (ref 3.5–5.1)
SODIUM: 127 meq/L — AB (ref 135–145)

## 2016-05-26 MED ORDER — LOSARTAN POTASSIUM 100 MG PO TABS: 100.0000 mg | ORAL_TABLET | Freq: Every day | ORAL | Status: DC

## 2016-05-26 MED ORDER — LEVOFLOXACIN 750 MG PO TABS: 750.0000 mg | ORAL_TABLET | Freq: Every day | ORAL | Status: DC

## 2016-05-26 NOTE — Progress Notes (Signed)
Patient ID: CARREEN PETRUS, female   DOB: [birth year], 82 y.o.   MRN: [id number]  Tommi Rumps, MD Phone: [phone]  Lisa Figueroa is a 82 y.o. female who presents today for follow-up.  Patient notes she is quite a bit better from her bronchitis and sinusitis. Does notes still having mild sinus congestion and chest congestion with some cough. Note she is breathing significantly better. No fevers. Cough is not productive anymore. She took her last pill of antibiotics today. Using inhaler every 5 hours. She does note she does still feel quite tired. Elevated white count was noted on blood work last week as well that could have been related to the infection versus her CLL.  She was found to be hyponatremic on lab work last week. She does take hydrochlorothiazide. No nausea, vomiting, or confusion. She does not check her blood pressure at home.  PMH: Former smoker  ROS see history of present illness  Objective  Physical Exam Filed Vitals:   05/26/16 0820  BP: 112/68  Pulse: 77  Temp: 97.7 F (36.5 C)    BP Readings from Last 3 Encounters:  05/26/16 112/68  05/22/16 120/84  05/13/16 112/76   Wt Readings from Last 3 Encounters:  05/26/16 159 lb 3.2 oz (72.213 kg)  05/22/16 163 lb 3.2 oz (74.027 kg)  05/13/16 164 lb 9.6 oz (74.662 kg)    Physical Exam  Constitutional: She is well-developed, well-nourished, and in no distress.  HENT:  Head: Normocephalic and atraumatic.  Right Ear: External ear normal.  Left Ear: External ear normal.  Mouth/Throat: Oropharynx is clear and moist. No oropharyngeal exudate.  Eyes: Conjunctivae are normal.  Cardiovascular: Normal rate, regular rhythm and normal heart sounds.   Pulmonary/Chest: Effort normal.  Mildly coarse breath sounds throughout, no wheezes  Musculoskeletal: She exhibits no edema.  Neurological: She is alert.  Skin: Skin is warm and dry. She is not diaphoretic.     Assessment/Plan: Please see individual problem  list.  Acute bronchitis Much improved from previously though not quite back to normal. Vital signs are stable. Oxygen is stable. Coarse breath sounds still remain though are improved. No wheezing at this time. We will extend the course of her antibiotics given persistent improving symptoms. Levaquin refilled for 5 more days. Albuterol inhaler as needed. Given return precautions.  CLL (chronic lymphocytic leukemia) ([facility]) Elevated white blood cell count at last visit potentially related to her infection, though could be related to progressing CLL. Lymphocytes slightly increased from previously, other cell lines stable from previously. No anemia or thrombocytopenia. We'll recheck today. If remains elevated we'll have her follow-up with her oncologist.  Hyponatremia Noted on lab work at last visit. Had been low on prior check. Suspect related HCTZ. Blood pressure is low normal and patient could likely tolerate holding her HCTZ. We'll hold this. New prescription for losartan sent to her pharmacy. Advised to check her blood pressure. Follow-up in one month for blood pressure recheck. Recheck BMP today.    Orders Placed This Encounter  Procedures  . CBC w/Diff  . Basic Metabolic Panel (BMET)    Meds ordered this encounter  Medications  . levofloxacin (LEVAQUIN) 750 MG tablet    Sig: Take 1 tablet (750 mg total) by mouth daily.    Dispense:  5 tablet    Refill:  0  . losartan (COZAAR) 100 MG tablet    Sig: Take 1 tablet (100 mg total) by mouth daily.    Dispense:  90 tablet  Refill:  0   Tommi Rumps, MD [place]

## 2016-05-26 NOTE — Assessment & Plan Note (Signed)
Much improved from previously though not quite back to normal. Vital signs are stable. Oxygen is stable. Coarse breath sounds still remain though are improved. No wheezing at this time. We will extend the course of her antibiotics given persistent improving symptoms. Levaquin refilled for 5 more days. Albuterol inhaler as needed. Given return precautions.

## 2016-05-26 NOTE — Progress Notes (Signed)
Pre visit review using our clinic review tool, if applicable. No additional management support is needed unless otherwise documented below in the visit note. 

## 2016-05-26 NOTE — Assessment & Plan Note (Addendum)
Elevated white blood cell count at last visit potentially related to her infection, though could be related to progressing CLL. Lymphocytes slightly increased from previously, other cell lines stable from previously. No anemia or thrombocytopenia. We'll recheck today. If remains elevated we'll have her follow-up with her oncologist.

## 2016-05-26 NOTE — Assessment & Plan Note (Addendum)
Noted on lab work at last visit. Had been low on prior check. Suspect related HCTZ. Blood pressure is low normal and patient could likely tolerate holding her HCTZ. We'll hold this. New prescription for losartan sent to her pharmacy. Advised to check her blood pressure. Follow-up in one month for blood pressure recheck. Recheck BMP today.

## 2016-05-26 NOTE — Patient Instructions (Addendum)
Nice to see you. I'm glad you're doing better. We will extend your antibiotic course to fully treat your infection. We will stop your hydrochlorothiazide. I have sent in a new prescription for losartan. Please monitor your blood pressure. We will check lab work today and call with the results. If you develop any nausea, vomiting, confusion, trouble breathing, cough productive of blood, or any new or changing symptoms please seek medical attention.

## 2016-05-28 ENCOUNTER — Other Ambulatory Visit: Payer: Self-pay | Admitting: Family Medicine

## 2016-05-28 DIAGNOSIS — E871 Hypo-osmolality and hyponatremia: Secondary | ICD-10-CM

## 2016-06-02 ENCOUNTER — Other Ambulatory Visit (INDEPENDENT_AMBULATORY_CARE_PROVIDER_SITE_OTHER): Payer: Medicare Other

## 2016-06-02 ENCOUNTER — Other Ambulatory Visit: Payer: Self-pay | Admitting: *Deleted

## 2016-06-02 DIAGNOSIS — E871 Hypo-osmolality and hyponatremia: Secondary | ICD-10-CM

## 2016-06-02 DIAGNOSIS — C9110 Chronic lymphocytic leukemia of B-cell type not having achieved remission: Secondary | ICD-10-CM

## 2016-06-02 LAB — BASIC METABOLIC PANEL
BUN: 17 mg/dL (ref 6–23)
CO2: 33 mEq/L — ABNORMAL HIGH (ref 19–32)
Calcium: 9.3 mg/dL (ref 8.4–10.5)
Chloride: 88 mEq/L — ABNORMAL LOW (ref 96–112)
Creatinine, Ser: 0.84 mg/dL (ref 0.40–1.20)
GFR: 69.04 mL/min (ref >60.00)
Glucose, Bld: 86 mg/dL (ref 70–99)
POTASSIUM: 3.7 meq/L (ref 3.5–5.1)
Sodium: 126 mEq/L — ABNORMAL LOW (ref 135–145)

## 2016-06-15 ENCOUNTER — Inpatient Hospital Stay: Payer: Medicare Other | Attending: Hematology and Oncology | Admitting: Oncology

## 2016-06-15 ENCOUNTER — Inpatient Hospital Stay: Payer: Medicare Other | Admitting: Hematology and Oncology

## 2016-06-15 ENCOUNTER — Inpatient Hospital Stay: Payer: Medicare Other

## 2016-06-15 VITALS — BP 126/83 | HR 76 | Temp 97.6°F | Resp 18 | Wt 168.4 lb

## 2016-06-15 DIAGNOSIS — Z86010 Personal history of colonic polyps: Secondary | ICD-10-CM | POA: Insufficient documentation

## 2016-06-15 DIAGNOSIS — C9110 Chronic lymphocytic leukemia of B-cell type not having achieved remission: Secondary | ICD-10-CM | POA: Diagnosis not present

## 2016-06-15 DIAGNOSIS — Z8619 Personal history of other infectious and parasitic diseases: Secondary | ICD-10-CM | POA: Insufficient documentation

## 2016-06-15 DIAGNOSIS — Z87891 Personal history of nicotine dependence: Secondary | ICD-10-CM | POA: Insufficient documentation

## 2016-06-15 DIAGNOSIS — Z85850 Personal history of malignant neoplasm of thyroid: Secondary | ICD-10-CM | POA: Diagnosis not present

## 2016-06-15 DIAGNOSIS — Z79899 Other long term (current) drug therapy: Secondary | ICD-10-CM

## 2016-06-15 DIAGNOSIS — I10 Essential (primary) hypertension: Secondary | ICD-10-CM | POA: Insufficient documentation

## 2016-06-15 DIAGNOSIS — E785 Hyperlipidemia, unspecified: Secondary | ICD-10-CM | POA: Diagnosis not present

## 2016-06-15 LAB — CBC WITH DIFFERENTIAL/PLATELET
BASOS PCT: 0 %
Basophils Absolute: 0.1 10*3/uL (ref 0–0.1)
Eosinophils Absolute: 0.1 10*3/uL (ref 0–0.7)
Eosinophils Relative: 0 %
HEMATOCRIT: 42.9 % (ref 35.0–47.0)
HEMOGLOBIN: 14.3 g/dL (ref 12.0–16.0)
LYMPHS ABS: 28 10*3/uL — AB (ref 1.0–3.6)
Lymphocytes Relative: 84 %
MCH: 28.2 pg (ref 26.0–34.0)
MCHC: 33.4 g/dL (ref 32.0–36.0)
MCV: 84.6 fL (ref 80.0–100.0)
MONO ABS: 0.7 10*3/uL (ref 0.2–0.9)
MONOS PCT: 2 %
NEUTROS ABS: 4.8 10*3/uL (ref 1.4–6.5)
NEUTROS PCT: 14 %
Platelets: 182 10*3/uL (ref 150–440)
RBC: 5.07 MIL/uL (ref 3.80–5.20)
RDW: 14.2 % (ref 11.5–14.5)
WBC: 33.8 10*3/uL — ABNORMAL HIGH (ref 3.6–11.0)

## 2016-06-15 NOTE — Progress Notes (Signed)
States is having decreased energy levels during the day and having difficulty sleeping at night. Recovering from recent respiratory infection treated by PCP. Continues to have occasional productive cough, no fever. Questions if can take vit b12 to help with energy levels. States is here to follow up blood counts related to CLL.

## 2016-06-18 ENCOUNTER — Ambulatory Visit (INDEPENDENT_AMBULATORY_CARE_PROVIDER_SITE_OTHER): Payer: Medicare Other | Admitting: Family Medicine

## 2016-06-18 ENCOUNTER — Encounter: Payer: Self-pay | Admitting: Family Medicine

## 2016-06-18 DIAGNOSIS — N3001 Acute cystitis with hematuria: Secondary | ICD-10-CM

## 2016-06-18 DIAGNOSIS — R300 Dysuria: Secondary | ICD-10-CM | POA: Diagnosis not present

## 2016-06-18 LAB — POCT URINALYSIS DIPSTICK
Bilirubin, UA: NEGATIVE
Glucose, UA: NEGATIVE
Ketones, UA: NEGATIVE
Nitrite, UA: NEGATIVE
PH UA: 7
Spec Grav, UA: 1.015
Urobilinogen, UA: 0.2

## 2016-06-18 MED ORDER — CIPROFLOXACIN HCL 500 MG PO TABS: 500.0000 mg | ORAL_TABLET | Freq: Two times a day (BID) | ORAL | Status: DC

## 2016-06-18 NOTE — Assessment & Plan Note (Signed)
New problem. History and urinalysis consistent with UTI. Treating with Cipro given associated chills. Awaiting culture.

## 2016-06-18 NOTE — Patient Instructions (Signed)
Take the antibiotic twice a day.  Follow up as needed.  Take care  Dr. Lacinda Axon

## 2016-06-18 NOTE — Progress Notes (Signed)
Subjective:  Patient ID: Lisa Figueroa, female    DOB: 05-29-1932  Age: 82 y.o. MRN: [id number]  CC: ? UTI  HPI:  82 year old female with a past mental history of CLL presents with concerns for UTI.  Patient reports that she has recently developed urinary urgency, frequency, and an uncomfortable sensation when she urinates (for the past few days).  No known exacerbating or relieving factors. She reports associated chills. No fever. No flank pain. No medication interventions tried. No other complaints today.  Social Hx   Social History   Social History  . Marital Status: Married    Spouse Name: N/A  . Number of Children: N/A  . Years of Education: N/A   Social History Main Topics  . Smoking status: Former Smoker -- 1.00 packs/day for 20 years    Quit date: 08/08/1984  . Smokeless tobacco: None  . Alcohol Use: 0.0 oz/week    0 Standard drinks or equivalent per week     Comment: wine glass daily  . Drug Use: No  . Sexual Activity: Not Asked   Other Topics Concern  . None   Social History Narrative   Review of Systems  Constitutional: Positive for chills. Negative for fever.  Genitourinary: Positive for dysuria, urgency and frequency.   Objective:  BP 130/82 mmHg  Pulse 78  Temp(Src) 97.7 F (36.5 C) (Oral)  Wt 163 lb (73.936 kg)  SpO2 90%  BP/Weight 06/18/2016 06/15/2016 [id number]  Systolic BP [id number]  Diastolic BP 82 83 68  Wt. (Lbs) 163 168.43 159.2  BMI 30.81 31.84 30.1   Physical Exam  Constitutional: She appears well-developed. No distress.  Cardiovascular: Normal rate and regular rhythm.   Pulmonary/Chest: Effort normal. She has no wheezes. She has no rales.  Abdominal: Soft. She exhibits no distension. There is no tenderness. There is no rebound and no guarding.  Neurological: She is alert.  Psychiatric: She has a normal mood and affect.  Vitals reviewed.  Lab Results  Component Value Date   WBC 33.8* 06/15/2016   HGB 14.3 06/15/2016   HCT  42.9 06/15/2016   PLT 182 06/15/2016   GLUCOSE 86 06/02/2016   CHOL 163 01/31/2016   TRIG 81.0 01/31/2016   HDL 50.00 01/31/2016   LDLCALC 97 01/31/2016   ALT 10 05/22/2016   AST 17 05/22/2016   NA 126* 06/02/2016   K 3.7 06/02/2016   CL 88* 06/02/2016   CREATININE 0.84 06/02/2016   BUN 17 06/02/2016   CO2 33* 06/02/2016   TSH 0.04* 01/31/2016   INR 1.0 06/27/2013   HGBA1C 5.2 05/14/2015   MICROALBUR 1.2 01/31/2016   Results for orders placed or performed in visit on 06/18/16 (from the past 24 hour(s))  POCT Urinalysis Dipstick     Status: Abnormal   Collection Time: 06/18/16  2:37 PM  Result Value Ref Range   Color, UA Yellow    Clarity, UA Hazy    Glucose, UA Negative    Bilirubin, UA Negative    Ketones, UA Negative    Spec Grav, UA 1.015    Blood, UA Moderate    pH, UA 7.0    Protein, UA Trace    Urobilinogen, UA 0.2    Nitrite, UA Negative    Leukocytes, UA moderate (2+) (A) Negative   Assessment & Plan:   Problem List Items Addressed This Visit    Acute cystitis with hematuria    New problem. History and urinalysis consistent with UTI. Treating  with Cipro given associated chills. Awaiting culture.      Relevant Medications   ciprofloxacin (CIPRO) 500 MG tablet   Other Relevant Orders   POCT Urinalysis Dipstick (Completed)   Urine culture      Meds ordered this encounter  Medications  . ciprofloxacin (CIPRO) 500 MG tablet    Sig: Take 1 tablet (500 mg total) by mouth 2 (two) times daily.    Dispense:  14 tablet    Refill:  0    Follow-up: PRN  [place]

## 2016-06-20 LAB — URINE CULTURE
COLONY COUNT: NO GROWTH
ORGANISM ID, BACTERIA: NO GROWTH

## 2016-06-21 NOTE — Progress Notes (Signed)
[place]  [phone]  Fax:([phone]) [place] DOB: 1932/11/16  MR#: [id number]  ID:[id number]  Patient Care Team: Crecencio Mc, MD as PCP - General (Internal Medicine) [name], MD as Consulting Physician (General Surgery)  CHIEF COMPLAINT:  Chief Complaint  Patient presents with  . CLL    INTERVAL HISTORY:  Patient returns to clinic today for repeat laboratory work and further evaluation. She admits to decreased energy throughout the day she also has occasional insomnia. She denies any fevers. She has no neurologic complaints. She denies any chest pain or shortness of breath. Patient denies any nausea, vomiting, constipation, or diarrhea. She has no urinary complaints. Patient otherwise feels well and offers no further specific complaints.   REVIEW OF SYSTEMS:   Review of Systems  Constitutional: Positive for malaise/fatigue. Negative for fever and weight loss.  Respiratory: Negative.  Negative for sputum production.   Cardiovascular: Negative.  Negative for chest pain.  Gastrointestinal: Negative.  Negative for nausea and vomiting.  Genitourinary: Negative.   Musculoskeletal: Negative.   Neurological: Positive for weakness.  Psychiatric/Behavioral: The patient has insomnia.     As per HPI. Otherwise, a complete review of systems is negatve.  ONCOLOGY HISTORY: Oncology History    1. Monoclonal population of lymphocytes.  Lymphoproliferative disease. 82 year old lady who had an abnormal mammogram mid axillary lymphadenopathy core biopsies which he suggested a lymphoproliferative disease positive staining for CD5 and CD20.     CLL (chronic lymphocytic leukemia) ([place]) (Resolved)   11/18/2014 Initial Diagnosis CLL (chronic lymphocytic leukemia)    PAST MEDICAL HISTORY: Past Medical History  Diagnosis Date  . Thyroid cancer ([place]) 1998  . History of colon polyps   . History of shingles   . Post herpetic  neuralgia   . Hypertension   . Hyperlipidemia   . Thyroid cancer ([place])   . CLL (chronic lymphocytic leukemia) ([place])   . Shingles     PAST SURGICAL HISTORY: Past Surgical History  Procedure Laterality Date  . Cholecystectomy    . Abdominal hysterectomy    . Hemorrhoid surgery    . Total thyroidectomy  1998  . Appendectomy    . Breast surgery Right     cyst excision  . Cardiac catheterization  05/18/2008    At [facility]: mild 30% stenosis in LCX. normal EF.    . Cataract extraction w/phaco Left 06/04/2015    Procedure: CATARACT EXTRACTION PHACO AND INTRAOCULAR LENS PLACEMENT (IOC);  Surgeon: Birder Robson, MD;  Location: [facility] ORS;  Service: Ophthalmology;  Laterality: Left;  [place] 00:34.1 AP% 23.0 CDE 7.85    FAMILY HISTORY Family History  Problem Relation Age of Onset  . Cancer Brother     prostate and bone  . Cancer Maternal Aunt     breast  . Cancer Cousin     breast - maternal side  . Heart disease Mother   . Heart disease Father     GYNECOLOGIC HISTORY:  No LMP recorded. Patient has had a hysterectomy.     ADVANCED DIRECTIVES:    HEALTH MAINTENANCE: Social History  Substance Use Topics  . Smoking status: Former Smoker -- 1.00 packs/day for 20 years    Quit date: 08/08/1984  . Smokeless tobacco: Not on file  . Alcohol Use: 0.0 oz/week    0 Standard drinks or equivalent per week     Comment: wine glass daily     Colonoscopy:  PAP:  Bone density:March 2017  Mammogram:May  2016  Allergies  Allergen Reactions  . Penicillin G Swelling  . Prednisone Other (See Comments)    crys and can't sleep crys and can't sleep    Current Outpatient Prescriptions  Medication Sig Dispense Refill  . acetaminophen (TYLENOL) 500 MG tablet Take 500 mg by mouth daily.    Marland Kitchen albuterol (PROVENTIL HFA;VENTOLIN HFA) 108 (90 Base) MCG/ACT inhaler Inhale 2 puffs into the lungs every 6 (six) hours as needed for wheezing or shortness of breath. 1 Inhaler 0  . gabapentin (NEURONTIN) 400  MG capsule Take 1 capsule (400 mg total) by mouth 2 (two) times daily. 270 capsule 0  . levothyroxine (SYNTHROID, LEVOTHROID) 150 MCG tablet Take 1 tablet (150 mcg total) by mouth daily. 90 tablet 0  . losartan-hydrochlorothiazide (HYZAAR) 100-12.5 MG tablet Take 1 tablet by mouth daily.    . simvastatin (ZOCOR) 20 MG tablet Take 1 tablet (20 mg total) by mouth daily. 90 tablet 1  . ciprofloxacin (CIPRO) 500 MG tablet Take 1 tablet (500 mg total) by mouth 2 (two) times daily. 14 tablet 0   No current facility-administered medications for this visit.    OBJECTIVE: BP 126/83 mmHg  Pulse 76  Temp(Src) 97.6 F (36.4 C) (Tympanic)  Resp 18  Wt 168 lb 6.9 oz (76.4 kg)   Body mass index is 31.84 kg/(m^2).    ECOG FS:1 - Symptomatic but completely ambulatory  General: Well-developed, well-nourished, no acute distress. Eyes: Pink conjunctiva, anicteric sclera. HEENT: Normocephalic, moist mucous membranes, clear oropharnyx. Lungs: Clear to auscultation bilaterally. Heart: Regular rate and rhythm. No rubs, murmurs, or gallops. Abdomen: Soft, nontender, nondistended. No organomegaly noted, normoactive bowel sounds. Musculoskeletal: No edema, cyanosis, or clubbing. Neuro: Alert, answering all questions appropriately. Cranial nerves grossly intact. Skin: No rashes or petechiae noted. Psych: Normal affect. Lymphatics: No cervical, calvicular, axillary or inguinal LAD.  LAB RESULTS:  Appointment on 06/15/2016  Component Date Value Ref Range Status  . WBC 06/15/2016 33.8* 3.6 - 11.0 K/uL Final  . RBC 06/15/2016 5.07  3.80 - 5.20 MIL/uL Final  . Hemoglobin 06/15/2016 14.3  12.0 - 16.0 g/dL Final  . HCT 06/15/2016 42.9  35.0 - 47.0 % Final  . MCV 06/15/2016 84.6  80.0 - 100.0 fL Final  . MCH 06/15/2016 28.2  26.0 - 34.0 pg Final  . MCHC 06/15/2016 33.4  32.0 - 36.0 g/dL Final  . RDW 06/15/2016 14.2  11.5 - 14.5 % Final  . Platelets 06/15/2016 182  150 - 440 K/uL Final  . Neutrophils Relative %  06/15/2016 14   Final  . Neutro Abs 06/15/2016 4.8  1.4 - 6.5 K/uL Final  . Lymphocytes Relative 06/15/2016 84   Final  . Lymphs Abs 06/15/2016 28.0* 1.0 - 3.6 K/uL Final  . Monocytes Relative 06/15/2016 2   Final  . Monocytes Absolute 06/15/2016 0.7  0.2 - 0.9 K/uL Final  . Eosinophils Relative 06/15/2016 0   Final  . Eosinophils Absolute 06/15/2016 0.1  0 - 0.7 K/uL Final  . Basophils Relative 06/15/2016 0   Final  . Basophils Absolute 06/15/2016 0.1  0 - 0.1 K/uL Final    STUDIES: No results found.  ASSESSMENT:  Chronic lymphocytic leukemia, Rai stage 0.  PLAN:    1.CLL. Patient's white blood cell count continues to be increased, a relatively stable and unchanged. The remainder of her blood counts are within normal limits. No intervention is needed at this time. Return to clinic in 6 months with repeat laboratory work and further  evaluation.   Patient expressed understanding and was in agreement with this plan. She also understands that She can call clinic at any time with any questions, concerns, or complaints.    Lloyd Huger, MD   06/21/2016 11:13 PM

## 2016-07-03 ENCOUNTER — Encounter: Payer: Self-pay | Admitting: Family Medicine

## 2016-07-03 ENCOUNTER — Ambulatory Visit (INDEPENDENT_AMBULATORY_CARE_PROVIDER_SITE_OTHER): Payer: Medicare Other | Admitting: Family Medicine

## 2016-07-03 VITALS — BP 112/74 | HR 69 | Temp 97.6°F | Ht 61.0 in | Wt 162.8 lb

## 2016-07-03 DIAGNOSIS — E871 Hypo-osmolality and hyponatremia: Secondary | ICD-10-CM

## 2016-07-03 DIAGNOSIS — J309 Allergic rhinitis, unspecified: Secondary | ICD-10-CM | POA: Diagnosis not present

## 2016-07-03 LAB — BASIC METABOLIC PANEL
BUN: 17 mg/dL (ref 6–23)
CHLORIDE: 91 meq/L — AB (ref 96–112)
CO2: 34 meq/L — AB (ref 19–32)
CREATININE: 0.84 mg/dL (ref 0.40–1.20)
Calcium: 9.5 mg/dL (ref 8.4–10.5)
GFR: 69.02 mL/min (ref >60.00)
Glucose, Bld: 73 mg/dL (ref 70–99)
POTASSIUM: 4.4 meq/L (ref 3.5–5.1)
Sodium: 130 mEq/L — ABNORMAL LOW (ref 135–145)

## 2016-07-03 MED ORDER — FLUTICASONE PROPIONATE 50 MCG/ACT NA SUSP: 2.0000 | Freq: Every day | NASAL | Status: DC

## 2016-07-03 NOTE — Progress Notes (Signed)
Patient ID: Lisa Figueroa, female   DOB: May 15, 1932, 82 y.o.   MRN: [id number]  Tommi Rumps, MD Phone: [phone]  Lisa Figueroa is a 82 y.o. female who presents today for follow-up.  Patient notes her upper respiratory symptoms have improved. She still mild sinus congestion and blowing clear mucus out of her nose. No postnasal drip. Some rhinorrhea. No fevers. Not much cough. Saw her hearing aid Dr. and they noted that her sinus congestion could be affecting her hearing. Not taking any allergy medications. She appears significantly better compared to her last visit.  Hyponatremia: Noted to be persistently hyponatremic on most recent lab work. No nausea, vomiting, confusion, or issues with alertness. It appears that she did not change her blood pressure medication as advised at her last visit. Sounds as though she is continuing to take losartan HCTZ.   ROS see history of present illness  Objective  Physical Exam Filed Vitals:   07/03/16 0954  BP: 112/74  Pulse: 69  Temp: 97.6 F (36.4 C)    BP Readings from Last 3 Encounters:  07/03/16 112/74  06/18/16 130/82  06/15/16 126/83   Wt Readings from Last 3 Encounters:  07/03/16 162 lb 12.8 oz (73.846 kg)  06/18/16 163 lb (73.936 kg)  06/15/16 168 lb 6.9 oz (76.4 kg)    Physical Exam  Constitutional: No distress.  HENT:  Head: Normocephalic and atraumatic.  Right Ear: External ear normal.  Left Ear: External ear normal.  Mouth/Throat: Oropharynx is clear and moist. No oropharyngeal exudate.  Normal TMs bilaterally, no sinus tenderness to percussion  Eyes: Conjunctivae are normal. Pupils are equal, round, and reactive to light.  Cardiovascular: Normal rate, regular rhythm and normal heart sounds.   Pulmonary/Chest: Effort normal and breath sounds normal.  Neurological: She is alert. Gait normal.  Skin: Skin is warm and dry. She is not diaphoretic.     Assessment/Plan: Please see individual problem  list.  Hyponatremia Patient apparently did not make any medication changes following her last visit. We will recheck her sodium today. If still low we'll discontinue the hydrochlorothiazide.  Allergic rhinitis Suspect patient's persistent mild sinus congestion and rhinorrhea is related to allergic rhinitis. Benign exam overall. We'll trial of Flonase for this issue. If no improvement she will let us know.    Orders Placed This Encounter  Procedures  . Basic Metabolic Panel (BMET)    Meds ordered this encounter  Medications  . fluticasone (FLONASE) 50 MCG/ACT nasal spray    Sig: Place 2 sprays into both nostrils daily.   Tommi Rumps, MD [place]

## 2016-07-03 NOTE — Progress Notes (Signed)
Pre visit review using our clinic review tool, if applicable. No additional management support is needed unless otherwise documented below in the visit note. 

## 2016-07-03 NOTE — Assessment & Plan Note (Signed)
Patient apparently did not make any medication changes following her last visit. We will recheck her sodium today. If still low we'll discontinue the hydrochlorothiazide.

## 2016-07-03 NOTE — Patient Instructions (Signed)
Nice to see you. We are going to start you on Flonase for congestion and runny nose. Please use this 2 sprays each nostril daily. We will check your sodium again. We will call with the results of this. If you develop fevers, cough productive of blood, nausea, vomiting, confusion, or any new or changing symptoms please seek medical attention.

## 2016-07-03 NOTE — Assessment & Plan Note (Signed)
Suspect patient's persistent mild sinus congestion and rhinorrhea is related to allergic rhinitis. Benign exam overall. We'll trial of Flonase for this issue. If no improvement she will let us know.

## 2016-07-06 ENCOUNTER — Other Ambulatory Visit: Payer: Self-pay

## 2016-07-06 ENCOUNTER — Telehealth: Payer: Self-pay | Admitting: Surgical

## 2016-07-06 MED ORDER — LOSARTAN POTASSIUM 100 MG PO TABS: 100.0000 mg | ORAL_TABLET | Freq: Every day | ORAL | Status: DC

## 2016-07-06 MED ORDER — GABAPENTIN 400 MG PO CAPS: 400.0000 mg | ORAL_CAPSULE | Freq: Two times a day (BID) | ORAL | Status: DC

## 2016-07-06 NOTE — Addendum Note (Signed)
Addended by: Caryl Bis Rizwan Kuyper G on: 07/06/2016 02:11 PM   Modules accepted: Orders, Medications

## 2016-07-06 NOTE — Telephone Encounter (Signed)
Notified patient of lab results she is fine with starting just the losartan.

## 2016-07-06 NOTE — Telephone Encounter (Signed)
Losartan sent to pharmacy. Losartan HCTZ combination pill discontinued in our system.

## 2016-07-07 ENCOUNTER — Telehealth: Payer: Self-pay | Admitting: *Deleted

## 2016-07-07 NOTE — Telephone Encounter (Signed)
Spoke with pharmacist they will inactive the other prescription as the patient was very unsure what she was to take.  I confirmed that she needs the Losartan alone, no HCTA. Thanks

## 2016-07-07 NOTE — Telephone Encounter (Signed)
Apothecary requested clarity on Pt losartan 100 mg single ingredient Rx.  Apothecary [phone]

## 2016-07-08 ENCOUNTER — Other Ambulatory Visit: Payer: Self-pay

## 2016-07-08 MED ORDER — FLUTICASONE PROPIONATE 50 MCG/ACT NA SUSP: 2.0000 | Freq: Every day | NASAL | Status: DC

## 2016-08-14 ENCOUNTER — Other Ambulatory Visit: Payer: Self-pay

## 2016-08-14 MED ORDER — LEVOTHYROXINE SODIUM 150 MCG PO TABS: 150.0000 ug | ORAL_TABLET | Freq: Every day | ORAL | 0 refills | Status: DC

## 2016-08-20 ENCOUNTER — Ambulatory Visit (INDEPENDENT_AMBULATORY_CARE_PROVIDER_SITE_OTHER): Payer: Medicare Other | Admitting: Cardiovascular Disease

## 2016-08-20 ENCOUNTER — Encounter: Payer: Self-pay | Admitting: Cardiovascular Disease

## 2016-08-20 VITALS — BP 148/90 | HR 65 | Ht 62.0 in | Wt 162.0 lb

## 2016-08-20 DIAGNOSIS — I10 Essential (primary) hypertension: Secondary | ICD-10-CM | POA: Diagnosis not present

## 2016-08-20 NOTE — Patient Instructions (Signed)
Medication Instructions: Continue same medications.   Labwork: None.   Procedures/Testing: None.   Follow-Up: As needed with Dr. Kenwood Rosiak.   Any Additional Special Instructions Will Be Listed Below (If Applicable).     If you need a refill on your cardiac medications before your next appointment, please call your pharmacy.   

## 2016-08-20 NOTE — Progress Notes (Signed)
Cardiology Office Note   Date:  08/20/2016   ID:  CHAISE TEWALT, DOB [birth year], MRN [id number]  PCP:  Crecencio Mc, MD  Cardiologist:   Kathlyn Sacramento, MD   Chief Complaint  Patient presents with  . Other    1 yr f/u. Meds reviewed verbally with pt.      History of Present Illness: DONEL BRITTIAN is a 82 y.o. female who presents for A follow-up visit. She has chronic medical conditions that include herpetic neuralgia, hypothyroidism and CLL. She was seen in 2015 for atypical chest pain. Cardiac catheterization showed minor irregularities with no obstructive disease. Abdominal aortic ultrasound showed no evidence of aortic aneurysm. Echocardiogram in September 2016 showed normal LV systolic function with mild aortic regurgitation. Aortic root was in the upper limits of normal. She has been doing well with no chest pain or shortness of breath.   Past Medical History:  Diagnosis Date  . CLL (chronic lymphocytic leukemia) ([place])   . History of colon polyps   . History of shingles   . Hyperlipidemia   . Hypertension   . Post herpetic neuralgia   . Shingles   . Thyroid cancer ([place]) 1998  . Thyroid cancer [facility])     Past Surgical History:  Procedure Laterality Date  . ABDOMINAL HYSTERECTOMY    . APPENDECTOMY    . BREAST SURGERY Right    cyst excision  . CARDIAC CATHETERIZATION  05/18/2008   At [facility]: mild 30% stenosis in LCX. normal EF.    Marland Kitchen CATARACT EXTRACTION W/PHACO Left 06/04/2015   Procedure: CATARACT EXTRACTION PHACO AND INTRAOCULAR LENS PLACEMENT (IOC);  Surgeon: Birder Robson, MD;  Location: [facility] ORS;  Service: Ophthalmology;  Laterality: Left;  [place] 00:34.1 AP% 23.0 CDE 7.85  . CHOLECYSTECTOMY    . HEMORRHOID SURGERY    . TOTAL THYROIDECTOMY  1998     Current Outpatient Prescriptions  Medication Sig Dispense Refill  . acetaminophen (TYLENOL) 500 MG tablet Take 500 mg by mouth daily.    . Cholecalciferol (VITAMIN D) 2000 units CAPS Take by mouth  daily.    Marland Kitchen gabapentin (NEURONTIN) 400 MG capsule Take 1 capsule (400 mg total) by mouth 2 (two) times daily. 270 capsule 0  . levothyroxine (SYNTHROID, LEVOTHROID) 150 MCG tablet Take 1 tablet (150 mcg total) by mouth daily. 90 tablet 0  . losartan (COZAAR) 100 MG tablet Take 1 tablet (100 mg total) by mouth daily. 90 tablet 3   No current facility-administered medications for this visit.     Allergies:   Penicillin g and Prednisone    Social History:  The patient  reports that she quit smoking about 32 years ago. She has a 20.00 pack-year smoking history. She has never used smokeless tobacco. She reports that she drinks alcohol. She reports that she does not use drugs.   Family History:  The patient's family history includes Cancer in her brother, cousin, and maternal aunt; Heart disease in her father and mother.    ROS:  Please see the history of present illness.   Otherwise, review of systems are positive for none.   All other systems are reviewed and negative.    PHYSICAL EXAM: VS:  BP (!) 148/90 (BP Location: Left Arm, Patient Position: Sitting, Cuff Size: Normal)   Pulse 65   Ht 5\' 2"  (1.575 m)   Wt 162 lb (73.5 kg)   BMI 29.63 kg/m  , BMI Body mass index is 29.63 kg/m. GEN: Well nourished, well  developed, in no acute distress  HEENT: normal  Neck: no JVD, carotid bruits, or masses Cardiac: RRR; no murmurs, rubs, or gallops,no edema  Respiratory:  clear to auscultation bilaterally, normal work of breathing GI: soft, nontender, nondistended, + BS MS: no deformity or atrophy  Skin: warm and dry, no rash Neuro:  Strength and sensation are intact Psych: euthymic mood, full affect   EKG:  EKG is ordered today. The ekg ordered today demonstrates normal sinus rhythm with no significant ST or T wave changes.  Recent Labs: 01/31/2016: TSH 0.04 05/22/2016: ALT 10 06/15/2016: Hemoglobin 14.3; Platelets 182 07/03/2016: BUN 17; Creatinine, Ser 0.84; Potassium 4.4; Sodium 130     Lipid Panel    Component Value Date/Time   CHOL 163 01/31/2016 1052   TRIG 81.0 01/31/2016 1052   HDL 50.00 01/31/2016 1052   CHOLHDL 3 01/31/2016 1052   VLDL 16.2 01/31/2016 1052   LDLCALC 97 01/31/2016 1052      Wt Readings from Last 3 Encounters:  08/20/16 162 lb (73.5 kg)  07/03/16 162 lb 12.8 oz (73.8 kg)  06/18/16 163 lb (73.9 kg)      ASSESSMENT AND PLAN:  1.  Essential hypertension: Blood pressure is reasonably controlled. Hydrochlorothiazide was discontinued by PCP due to hyponatremia.  2. Previously dilated aortic root: Echocardiogram from last year showed that the aortic root was about the limit of normal. Thus, no further follow-up or workup for this year as needed.   Disposition:    The patient overall is not having any active cardiac issues. She can follow-up with me as needed.  Signed,  Kathlyn Sacramento, MD  08/20/2016 10:39 AM    [place]

## 2016-08-24 DIAGNOSIS — H02055 Trichiasis without entropian left lower eyelid: Secondary | ICD-10-CM | POA: Diagnosis not present

## 2016-08-27 DIAGNOSIS — D1801 Hemangioma of skin and subcutaneous tissue: Secondary | ICD-10-CM | POA: Diagnosis not present

## 2016-08-27 DIAGNOSIS — D0439 Carcinoma in situ of skin of other parts of face: Secondary | ICD-10-CM | POA: Diagnosis not present

## 2016-08-27 DIAGNOSIS — D485 Neoplasm of uncertain behavior of skin: Secondary | ICD-10-CM | POA: Diagnosis not present

## 2016-08-27 DIAGNOSIS — C44622 Squamous cell carcinoma of skin of right upper limb, including shoulder: Secondary | ICD-10-CM | POA: Diagnosis not present

## 2016-08-27 DIAGNOSIS — L821 Other seborrheic keratosis: Secondary | ICD-10-CM | POA: Diagnosis not present

## 2016-09-02 ENCOUNTER — Ambulatory Visit (INDEPENDENT_AMBULATORY_CARE_PROVIDER_SITE_OTHER): Payer: Medicare Other | Admitting: Internal Medicine

## 2016-09-02 ENCOUNTER — Encounter: Payer: Self-pay | Admitting: Internal Medicine

## 2016-09-02 VITALS — BP 150/100 | HR 75 | Wt 162.0 lb

## 2016-09-02 DIAGNOSIS — I10 Essential (primary) hypertension: Secondary | ICD-10-CM | POA: Diagnosis not present

## 2016-09-02 DIAGNOSIS — Z23 Encounter for immunization: Secondary | ICD-10-CM | POA: Diagnosis not present

## 2016-09-02 DIAGNOSIS — E871 Hypo-osmolality and hyponatremia: Secondary | ICD-10-CM | POA: Diagnosis not present

## 2016-09-02 DIAGNOSIS — R928 Other abnormal and inconclusive findings on diagnostic imaging of breast: Secondary | ICD-10-CM

## 2016-09-02 DIAGNOSIS — E038 Other specified hypothyroidism: Secondary | ICD-10-CM

## 2016-09-02 DIAGNOSIS — E034 Atrophy of thyroid (acquired): Secondary | ICD-10-CM

## 2016-09-02 DIAGNOSIS — E890 Postprocedural hypothyroidism: Secondary | ICD-10-CM

## 2016-09-02 LAB — BASIC METABOLIC PANEL
BUN: 10 mg/dL (ref 6–23)
CALCIUM: 8.9 mg/dL (ref 8.4–10.5)
CO2: 32 mEq/L (ref 19–32)
CREATININE: 0.7 mg/dL (ref 0.40–1.20)
Chloride: 97 mEq/L (ref 96–112)
GFR: 85.15 mL/min (ref >60.00)
GLUCOSE: 89 mg/dL (ref 70–99)
POTASSIUM: 4.2 meq/L (ref 3.5–5.1)
Sodium: 133 mEq/L — ABNORMAL LOW (ref 135–145)

## 2016-09-02 MED ORDER — AMLODIPINE BESYLATE 2.5 MG PO TABS: 2.5000 mg | ORAL_TABLET | Freq: Every day | ORAL | 3 refills | Status: DC

## 2016-09-02 NOTE — Patient Instructions (Signed)
I will order your mammogram  Your blood pressure is high ,  I am adding amlodipine to your losartan regimen  Take both medications for blood pressure in the morning   I am repeating your sodium and thyroid  levels today

## 2016-09-02 NOTE — Assessment & Plan Note (Addendum)
Not well controlled since HCTZ was stopped in July for hyponatremia. .  Amlodipine added for BP 150/100,  Return in 1 month for  BP check

## 2016-09-02 NOTE — Progress Notes (Signed)
Subjective:  Patient ID: Lisa Figueroa, female    DOB: [birth year]  Age: 82 y.o. MRN: [id number]  CC: The primary encounter diagnosis was Need for influenza vaccination. Diagnoses of Hyponatremia, Hypothyroidism due to acquired atrophy of thyroid, Essential hypertension, Abnormal mammogram, and Postsurgical hypothyroidism were also pertinent to this visit.  HPI Lisa Figueroa presents for 6 month follow up on multiple issues including hypothyroid and hypertension.    She had 4 acute visits since her last scheduled visit with me in March.  1) treated for bronchitis in May  with resolution.   2)  Leukocytosis and hyponatremia noted and addressed by ES and Oncology given historyf CLL   3) Had thyroid dose lowered in Feb,  Repeat TSh has not been done    4) Treated in June July for cystitis and persistent hyponatremia due to continued use of hctz. , despite being stopped in may by ES.  5) discussed mammogram  Last one may 2016 [facility]. >  add''l images due to LAD>    6) Osteoporosis by DEXA in March .  Prolia  Was prescribed and her first dose was given in April  26  Due in late  October    Lab Results  Component Value Date   NA 133 (L) 09/02/2016   K 4.2 09/02/2016   CL 97 09/02/2016   CO2 32 09/02/2016   Lab Results  Component Value Date   TSH 0.16 (L) 09/02/2016     Outpatient Medications Prior to Visit  Medication Sig Dispense Refill  . acetaminophen (TYLENOL) 500 MG tablet Take 500 mg by mouth daily.    . Cholecalciferol (VITAMIN D) 2000 units CAPS Take by mouth daily.    Marland Kitchen gabapentin (NEURONTIN) 400 MG capsule Take 1 capsule (400 mg total) by mouth 2 (two) times daily. 270 capsule 0  . levothyroxine (SYNTHROID, LEVOTHROID) 150 MCG tablet Take 1 tablet (150 mcg total) by mouth daily. 90 tablet 0  . losartan (COZAAR) 100 MG tablet Take 1 tablet (100 mg total) by mouth daily. 90 tablet 3   No facility-administered medications prior to visit.     Review of  Systems;  Patient denies headache, fevers, malaise, unintentional weight loss, skin rash, eye pain, sinus congestion and sinus pain, sore throat, dysphagia,  hemoptysis , cough, dyspnea, wheezing, chest pain, palpitations, orthopnea, edema, abdominal pain, nausea, melena, diarrhea, constipation, flank pain, dysuria, hematuria, urinary  Frequency, nocturia, numbness, tingling, seizures,  Focal weakness, Loss of consciousness,  Tremor, insomnia, depression, anxiety, and suicidal ideation.      Objective:  BP (!) 150/100 (BP Location: Right Arm, Patient Position: Sitting, Cuff Size: Normal)   Pulse 75   Wt 162 lb (73.5 kg)   SpO2 95%   BMI 29.63 kg/m   BP Readings from Last 3 Encounters:  09/02/16 (!) 150/100  08/20/16 (!) 148/90  07/03/16 112/74    Wt Readings from Last 3 Encounters:  09/02/16 162 lb (73.5 kg)  08/20/16 162 lb (73.5 kg)  07/03/16 162 lb 12.8 oz (73.8 kg)    General appearance: alert, cooperative and appears stated age Ears: normal TM's and external ear canals both ears Throat: lips, mucosa, and tongue normal; teeth and gums normal Neck: no adenopathy, no carotid bruit, supple, symmetrical, trachea midline and thyroid not enlarged, symmetric, no tenderness/mass/nodules Back: symmetric, no curvature. ROM normal. No CVA tenderness. Lungs: clear to auscultation bilaterally Heart: regular rate and rhythm, S1, S2 normal, no murmur, click, rub or gallop Abdomen: soft, non-tender;  bowel sounds normal; no masses,  no organomegaly Pulses: 2+ and symmetric Skin: Skin color, texture, turgor normal. No rashes or lesions Lymph nodes: Cervical, supraclavicular, and axillary nodes normal.  Lab Results  Component Value Date   HGBA1C 5.2 05/14/2015    Lab Results  Component Value Date   CREATININE 0.70 09/02/2016   CREATININE 0.84 07/03/2016   CREATININE 0.84 06/02/2016    Lab Results  Component Value Date   WBC 33.8 (H) 06/15/2016   HGB 14.3 06/15/2016   HCT 42.9  06/15/2016   PLT 182 06/15/2016   GLUCOSE 89 09/02/2016   CHOL 163 01/31/2016   TRIG 81.0 01/31/2016   HDL 50.00 01/31/2016   LDLCALC 97 01/31/2016   ALT 10 05/22/2016   AST 17 05/22/2016   NA 133 (L) 09/02/2016   K 4.2 09/02/2016   CL 97 09/02/2016   CREATININE 0.70 09/02/2016   BUN 10 09/02/2016   CO2 32 09/02/2016   TSH 0.16 (L) 09/02/2016   INR 1.0 06/27/2013   HGBA1C 5.2 05/14/2015   MICROALBUR 1.2 01/31/2016    Dg Chest 2 View  Result Date: 05/22/2016 CLINICAL DATA:  Cough and mild exertional shortness of breath, wheezing. EXAM: CHEST  2 VIEW COMPARISON:  None. FINDINGS: Heart size is normal. Cardiomediastinal silhouette is within normal limits in size and configuration, given the expected age-related aortic ectasia. Atherosclerotic changes are seen along the walls of the grossly normal-caliber thoracic aorta. Lungs appear to be at least mildly hyperexpanded suggesting COPD. Mild scarring/fibrosis noted at each lung base. Probable bibasilar bronchiectasis. Lungs otherwise clear. No evidence of pneumonia. No pleural effusion or pneumothorax seen. Osseous structures are diffusely osteopenic limiting characterization of osseous detail, but no acute or suspicious osseous lesion identified. IMPRESSION: 1. Probable COPD. Suspect associated chronic interstitial lung disease and/or scarring at each lung base. Probable bibasilar bronchiectasis. 2. No acute findings.  No evidence of pneumonia. Electronically Signed   By: Franki Cabot M.D.   On: 05/22/2016 11:02    Assessment & Plan:   Problem List Items Addressed This Visit    Abnormal mammogram    She has not had subsequent mammogram which was ordered today.      Postsurgical hypothyroidism    Thyroid function was  very overactive on prior dose in February and dose was increased at that time. Zit is still overactive, but goal is TSH suppression given history of overactive thyroids . No changes today .  Lab Results  Component Value  Date   TSH 0.16 (L) 09/02/2016   tley.       Essential hypertension    Not well controlled since HCTZ was stopped in July for hyponatremia. .  Amlodipine added for BP 150/100,  Return in 1 month for  BP check       Relevant Medications   amLODipine (NORVASC) 2.5 MG tablet   Hyponatremia    improving steadily  , likely due to  stopping HCTZ.  Mild  asympotmatic  Lab Results  Component Value Date   NA 133 (L) 09/02/2016   K 4.2 09/02/2016   CL 97 09/02/2016   CO2 32 09/02/2016         Relevant Orders   Basic metabolic panel (Completed)    Other Visit Diagnoses    Need for influenza vaccination    -  Primary   Relevant Orders   Flu vaccine HIGH DOSE PF (Fluzone High dose) (Completed)   Hypothyroidism due to acquired atrophy of thyroid  Relevant Orders   TSH (Completed)      I am having Ms. Slaydon start on amLODipine. I am also having her maintain her acetaminophen, losartan, gabapentin, levothyroxine, and Vitamin D.  Meds ordered this encounter  Medications  . amLODipine (NORVASC) 2.5 MG tablet    Sig: Take 1 tablet (2.5 mg total) by mouth daily.    Dispense:  90 tablet    Refill:  3    There are no discontinued medications.  Follow-up: Return in about 4 weeks (around 09/30/2016) for RN BP check,  6 months Shenell Rogalski .   Crecencio Mc, MD

## 2016-09-03 DIAGNOSIS — L905 Scar conditions and fibrosis of skin: Secondary | ICD-10-CM | POA: Diagnosis not present

## 2016-09-03 DIAGNOSIS — C44622 Squamous cell carcinoma of skin of right upper limb, including shoulder: Secondary | ICD-10-CM | POA: Diagnosis not present

## 2016-09-03 LAB — TSH: TSH: 0.16 u[IU]/mL — ABNORMAL LOW (ref 0.35–4.50)

## 2016-09-05 NOTE — Assessment & Plan Note (Addendum)
Thyroid function was  very overactive on prior dose in February and dose was increased at that time. Zit is still overactive, but goal is TSH suppression given history of overactive thyroids . No changes today .  Lab Results  Component Value Date   TSH 0.16 (L) 09/02/2016   tley.

## 2016-09-05 NOTE — Assessment & Plan Note (Signed)
improving steadily  , likely due to  stopping HCTZ.  Mild  asympotmatic  Lab Results  Component Value Date   NA 133 (L) 09/02/2016   K 4.2 09/02/2016   CL 97 09/02/2016   CO2 32 09/02/2016

## 2016-09-05 NOTE — Assessment & Plan Note (Signed)
She has not had subsequent mammogram which was ordered today.

## 2016-09-17 DIAGNOSIS — T814XXA Infection following a procedure, initial encounter: Secondary | ICD-10-CM | POA: Diagnosis not present

## 2016-10-04 ENCOUNTER — Inpatient Hospital Stay
Admission: EM | Admit: 2016-10-04 | Discharge: 2016-10-07 | DRG: 193 | Disposition: A | Discharge status: Home Health | Payer: Medicare Other | Attending: Internal Medicine | Admitting: Internal Medicine

## 2016-10-04 ENCOUNTER — Encounter: Payer: Self-pay | Admitting: Emergency Medicine

## 2016-10-04 ENCOUNTER — Emergency Department: Payer: Medicare Other

## 2016-10-04 DIAGNOSIS — Z803 Family history of malignant neoplasm of breast: Secondary | ICD-10-CM

## 2016-10-04 DIAGNOSIS — R0902 Hypoxemia: Secondary | ICD-10-CM | POA: Diagnosis not present

## 2016-10-04 DIAGNOSIS — Z880 Allergy status to penicillin: Secondary | ICD-10-CM

## 2016-10-04 DIAGNOSIS — J9601 Acute respiratory failure with hypoxia: Secondary | ICD-10-CM | POA: Diagnosis not present

## 2016-10-04 DIAGNOSIS — E785 Hyperlipidemia, unspecified: Secondary | ICD-10-CM | POA: Diagnosis present

## 2016-10-04 DIAGNOSIS — J189 Pneumonia, unspecified organism: Secondary | ICD-10-CM | POA: Diagnosis present

## 2016-10-04 DIAGNOSIS — J9621 Acute and chronic respiratory failure with hypoxia: Secondary | ICD-10-CM | POA: Diagnosis present

## 2016-10-04 DIAGNOSIS — R0602 Shortness of breath: Secondary | ICD-10-CM | POA: Diagnosis not present

## 2016-10-04 DIAGNOSIS — E039 Hypothyroidism, unspecified: Secondary | ICD-10-CM | POA: Diagnosis not present

## 2016-10-04 DIAGNOSIS — C9110 Chronic lymphocytic leukemia of B-cell type not having achieved remission: Secondary | ICD-10-CM | POA: Diagnosis present

## 2016-10-04 DIAGNOSIS — E440 Moderate protein-calorie malnutrition: Secondary | ICD-10-CM | POA: Diagnosis present

## 2016-10-04 DIAGNOSIS — Z8249 Family history of ischemic heart disease and other diseases of the circulatory system: Secondary | ICD-10-CM

## 2016-10-04 DIAGNOSIS — Z87891 Personal history of nicotine dependence: Secondary | ICD-10-CM

## 2016-10-04 DIAGNOSIS — Z8619 Personal history of other infectious and parasitic diseases: Secondary | ICD-10-CM | POA: Diagnosis not present

## 2016-10-04 DIAGNOSIS — Z85850 Personal history of malignant neoplasm of thyroid: Secondary | ICD-10-CM | POA: Diagnosis not present

## 2016-10-04 DIAGNOSIS — E890 Postprocedural hypothyroidism: Secondary | ICD-10-CM | POA: Diagnosis present

## 2016-10-04 DIAGNOSIS — Z86010 Personal history of colonic polyps: Secondary | ICD-10-CM | POA: Diagnosis not present

## 2016-10-04 DIAGNOSIS — R531 Weakness: Secondary | ICD-10-CM

## 2016-10-04 DIAGNOSIS — Z6828 Body mass index (BMI) 28.0-28.9, adult: Secondary | ICD-10-CM

## 2016-10-04 DIAGNOSIS — Z79899 Other long term (current) drug therapy: Secondary | ICD-10-CM

## 2016-10-04 DIAGNOSIS — Z888 Allergy status to other drugs, medicaments and biological substances status: Secondary | ICD-10-CM

## 2016-10-04 DIAGNOSIS — I10 Essential (primary) hypertension: Secondary | ICD-10-CM | POA: Diagnosis present

## 2016-10-04 LAB — URINALYSIS COMPLETE WITH MICROSCOPIC (ARMC ONLY)
BACTERIA UA: NONE SEEN
BILIRUBIN URINE: NEGATIVE
GLUCOSE, UA: NEGATIVE mg/dL
Ketones, ur: NEGATIVE mg/dL
Leukocytes, UA: NEGATIVE
NITRITE: NEGATIVE
Protein, ur: NEGATIVE mg/dL
Specific Gravity, Urine: 1.026 (ref 1.005–1.030)
pH: 7 (ref 5.0–8.0)

## 2016-10-04 LAB — BASIC METABOLIC PANEL
ANION GAP: 6 (ref 5–15)
BUN: 9 mg/dL (ref 6–20)
CO2: 34 mmol/L — ABNORMAL HIGH (ref 22–32)
Calcium: 8.5 mg/dL — ABNORMAL LOW (ref 8.9–10.3)
Chloride: 91 mmol/L — ABNORMAL LOW (ref 101–111)
Creatinine, Ser: 0.66 mg/dL (ref 0.44–1.00)
GFR calc Af Amer: >60 mL/min (ref >60)
Glucose, Bld: 103 mg/dL — ABNORMAL HIGH (ref 65–99)
POTASSIUM: 4.2 mmol/L (ref 3.5–5.1)
SODIUM: 131 mmol/L — AB (ref 135–145)

## 2016-10-04 LAB — CBC
HCT: 43.4 % (ref 35.0–47.0)
Hemoglobin: 14 g/dL (ref 12.0–16.0)
MCH: 27.4 pg (ref 26.0–34.0)
MCHC: 32.3 g/dL (ref 32.0–36.0)
MCV: 84.9 fL (ref 80.0–100.0)
Platelets: 225 10*3/uL (ref 150–440)
RBC: 5.11 MIL/uL (ref 3.80–5.20)
RDW: 13.5 % (ref 11.5–14.5)
WBC: 30 10*3/uL — ABNORMAL HIGH (ref 3.6–11.0)

## 2016-10-04 LAB — TSH: TSH: 0.565 u[IU]/mL (ref 0.350–4.500)

## 2016-10-04 LAB — TROPONIN I: Troponin I: <0.03 ng/mL (ref <0.03)

## 2016-10-04 LAB — BRAIN NATRIURETIC PEPTIDE: B NATRIURETIC PEPTIDE 5: 177 pg/mL — AB (ref 0.0–100.0)

## 2016-10-04 LAB — LACTIC ACID, PLASMA: LACTIC ACID, VENOUS: 0.9 mmol/L (ref 0.5–1.9)

## 2016-10-04 MED ORDER — SODIUM CHLORIDE 0.9% FLUSH
3.0000 mL | Freq: Two times a day (BID) | INTRAVENOUS | Status: DC
  Administered 2016-10-05 – 2016-10-07 (×5): 3 mL via INTRAVENOUS

## 2016-10-04 MED ORDER — ONDANSETRON HCL 4 MG PO TABS: 4.0000 mg | ORAL_TABLET | Freq: Four times a day (QID) | ORAL | Status: DC | PRN

## 2016-10-04 MED ORDER — LEVOTHYROXINE SODIUM 150 MCG PO TABS
150.0000 ug | ORAL_TABLET | Freq: Every day | ORAL | Status: DC
  Administered 2016-10-05 – 2016-10-07 (×3): 150 ug via ORAL
  Filled 2016-10-04 (×3): qty 1

## 2016-10-04 MED ORDER — IPRATROPIUM-ALBUTEROL 0.5-2.5 (3) MG/3ML IN SOLN
3.0000 mL | Freq: Once | RESPIRATORY_TRACT | Status: AC
  Administered 2016-10-04: 3 mL via RESPIRATORY_TRACT
  Filled 2016-10-04: qty 3

## 2016-10-04 MED ORDER — ENOXAPARIN SODIUM 40 MG/0.4ML ~~LOC~~ SOLN
40.0000 mg | SUBCUTANEOUS | Status: DC
  Administered 2016-10-05 – 2016-10-06 (×2): 40 mg via SUBCUTANEOUS
  Filled 2016-10-04 (×2): qty 0.4

## 2016-10-04 MED ORDER — AMLODIPINE BESYLATE 5 MG PO TABS
2.5000 mg | ORAL_TABLET | Freq: Every day | ORAL | Status: DC
  Administered 2016-10-05 – 2016-10-06 (×2): 2.5 mg via ORAL
  Filled 2016-10-04 (×3): qty 1

## 2016-10-04 MED ORDER — IOPAMIDOL (ISOVUE-370) INJECTION 76%
75.0000 mL | Freq: Once | INTRAVENOUS | Status: AC | PRN
  Administered 2016-10-04: 75 mL via INTRAVENOUS

## 2016-10-04 MED ORDER — ONDANSETRON HCL 4 MG/2ML IJ SOLN: 4.0000 mg | Freq: Four times a day (QID) | INTRAMUSCULAR | Status: DC | PRN

## 2016-10-04 MED ORDER — IPRATROPIUM-ALBUTEROL 0.5-2.5 (3) MG/3ML IN SOLN: 3.0000 mL | RESPIRATORY_TRACT | Status: DC | PRN

## 2016-10-04 MED ORDER — GABAPENTIN 400 MG PO CAPS
400.0000 mg | ORAL_CAPSULE | Freq: Two times a day (BID) | ORAL | Status: DC
  Administered 2016-10-05 – 2016-10-07 (×5): 400 mg via ORAL
  Filled 2016-10-04 (×5): qty 1

## 2016-10-04 MED ORDER — ACETAMINOPHEN 650 MG RE SUPP: 650.0000 mg | Freq: Four times a day (QID) | RECTAL | Status: DC | PRN

## 2016-10-04 MED ORDER — LOSARTAN POTASSIUM 50 MG PO TABS
100.0000 mg | ORAL_TABLET | Freq: Every day | ORAL | Status: DC
  Administered 2016-10-05 – 2016-10-07 (×3): 100 mg via ORAL
  Filled 2016-10-04 (×3): qty 2

## 2016-10-04 MED ORDER — LEVOFLOXACIN IN D5W 750 MG/150ML IV SOLN
750.0000 mg | INTRAVENOUS | Status: DC
  Administered 2016-10-04 – 2016-10-05 (×2): 750 mg via INTRAVENOUS
  Filled 2016-10-04 (×3): qty 150

## 2016-10-04 MED ORDER — ACETAMINOPHEN 325 MG PO TABS
650.0000 mg | ORAL_TABLET | Freq: Four times a day (QID) | ORAL | Status: DC | PRN
  Administered 2016-10-05 – 2016-10-07 (×2): 650 mg via ORAL
  Filled 2016-10-04 (×2): qty 2

## 2016-10-04 NOTE — ED Notes (Signed)
Pt assisted to use bed pan.

## 2016-10-04 NOTE — ED Triage Notes (Signed)
Pt presents to ED c/o weakness . Pt states she can hardly sit up and eat x 1 month. Pt was given doxycycline after cancer removal " I havent been right since"

## 2016-10-04 NOTE — H&P (Signed)
[place] [facility] [place] NAME: Lisa Figueroa    MR#:  [id number]  DATE OF BIRTH:  03-12-1932  DATE OF ADMISSION:  10/04/2016  PRIMARY CARE PHYSICIAN: Crecencio Mc, MD   REQUESTING/REFERRING PHYSICIAN: Marcelene Butte, MD  CHIEF COMPLAINT:   Chief Complaint  Patient presents with  . Weakness    HISTORY OF PRESENT ILLNESS:  Lisa Figueroa  is a 82 y.o. female who presents with 2-3 days of progressive weakness as well as mild cough. Patient denies any fever or chills. Denies any sputum production. On evaluation in the ED today she was noted to be hypoxic on room air with O2 sats in the 70s. CT scan of her chest showed groundglass opacities consistent with inflammation versus pneumonia. She does have a history of CLL, though her white count is stable at her chronically elevated level. Given the combination of cough with imaging findings and hypoxia, hospitals were called for admission and treatment for pneumonia.  PAST MEDICAL HISTORY:   Past Medical History:  Diagnosis Date  . CLL (chronic lymphocytic leukemia) ([place])   . History of colon polyps   . History of shingles   . Hyperlipidemia   . Hypertension   . Post herpetic neuralgia   . Shingles   . Thyroid cancer ([place]) 1998  . Thyroid cancer ([place])     PAST SURGICAL HISTORY:   Past Surgical History:  Procedure Laterality Date  . ABDOMINAL HYSTERECTOMY    . APPENDECTOMY    . BREAST SURGERY Right    cyst excision  . CARDIAC CATHETERIZATION  05/18/2008   At [facility]: mild 30% stenosis in LCX. normal EF.    Marland Kitchen CATARACT EXTRACTION W/PHACO Left 06/04/2015   Procedure: CATARACT EXTRACTION PHACO AND INTRAOCULAR LENS PLACEMENT (IOC);  Surgeon: Birder Robson, MD;  Location: [facility] ORS;  Service: Ophthalmology;  Laterality: Left;  [place] 00:34.1 AP% 23.0 CDE 7.85  . CHOLECYSTECTOMY    . HEMORRHOID SURGERY    . TOTAL THYROIDECTOMY  1998    SOCIAL HISTORY:   Social History  Substance Use Topics   . Smoking status: Former Smoker    Packs/day: 1.00    Years: 20.00    Quit date: 08/08/1984  . Smokeless tobacco: Never Used  . Alcohol use 0.0 oz/week     Comment: wine glass daily    FAMILY HISTORY:   Family History  Problem Relation Age of Onset  . Cancer Brother     prostate and bone  . Heart disease Mother   . Heart disease Father   . Cancer Maternal Aunt     breast  . Cancer Cousin     breast - maternal side    DRUG ALLERGIES:   Allergies  Allergen Reactions  . Penicillin G Swelling  . Prednisone Other (See Comments)    crys and can't sleep crys and can't sleep    MEDICATIONS AT HOME:   Prior to Admission medications   Medication Sig Start Date End Date Taking? Authorizing Provider  acetaminophen (TYLENOL) 500 MG tablet Take 500 mg by mouth daily.   Yes Historical Provider, MD  amLODipine (NORVASC) 2.5 MG tablet Take 1 tablet (2.5 mg total) by mouth daily. 09/02/16  Yes Crecencio Mc, MD  Cholecalciferol (VITAMIN D) 2000 units CAPS Take by mouth daily.   Yes Historical Provider, MD  gabapentin (NEURONTIN) 400 MG capsule Take 1 capsule (400 mg total) by mouth 2 (two) times daily. 07/06/16  Yes Crecencio Mc, MD  levothyroxine (SYNTHROID, LEVOTHROID) 150 MCG tablet Take 1 tablet (150 mcg total) by mouth daily. 08/14/16  Yes Crecencio Mc, MD  losartan (COZAAR) 100 MG tablet Take 1 tablet (100 mg total) by mouth daily. 07/06/16  Yes Leone Haven, MD    REVIEW OF SYSTEMS:  Review of Systems  Constitutional: Negative for chills, fever, malaise/fatigue and weight loss.  HENT: Negative for ear pain, hearing loss and tinnitus.   Eyes: Negative for blurred vision, double vision, pain and redness.  Respiratory: Positive for cough and shortness of breath. Negative for hemoptysis.   Cardiovascular: Negative for chest pain, palpitations, orthopnea and leg swelling.  Gastrointestinal: Negative for abdominal pain, constipation, diarrhea, nausea and vomiting.   Genitourinary: Negative for dysuria, frequency and hematuria.  Musculoskeletal: Negative for back pain, joint pain and neck pain.  Skin:       No acne, rash, or lesions  Neurological: Positive for weakness. Negative for dizziness, tremors and focal weakness.  Endo/Heme/Allergies: Negative for polydipsia. Does not bruise/bleed easily.  Psychiatric/Behavioral: Negative for depression. The patient is not nervous/anxious and does not have insomnia.      VITAL SIGNS:   Vitals:   10/04/16 1803 10/04/16 1805 10/04/16 1806 10/04/16 2120  BP:  (!) 155/92  (!) 147/97  Pulse:  83  82  Resp:  18  20  Temp:  97.7 F (36.5 C)    TempSrc:  Oral    SpO2: (!) 79% 96%  95%  Weight:   70.3 kg (155 lb)   Height:   5\' 2"  (1.575 m)    Wt Readings from Last 3 Encounters:  10/04/16 70.3 kg (155 lb)  09/02/16 73.5 kg (162 lb)  08/20/16 73.5 kg (162 lb)    PHYSICAL EXAMINATION:  Physical Exam  Vitals reviewed. Constitutional: She is oriented to person, place, and time. She appears well-developed and well-nourished. No distress.  HENT:  Head: Normocephalic and atraumatic.  Mouth/Throat: Oropharynx is clear and moist.  Eyes: Conjunctivae and EOM are normal. Pupils are equal, round, and reactive to light. No scleral icterus.  Neck: Normal range of motion. Neck supple. No JVD present. No thyromegaly present.  Cardiovascular: Normal rate, regular rhythm and intact distal pulses.  Exam reveals no gallop and no friction rub.   No murmur heard. Respiratory: Effort normal. No respiratory distress. She has no wheezes. She has rales (scattered rales and fine rhonchi).  GI: Soft. Bowel sounds are normal. She exhibits no distension. There is no tenderness.  Musculoskeletal: Normal range of motion. She exhibits no edema.  No arthritis, no gout  Lymphadenopathy:    She has no cervical adenopathy.  Neurological: She is alert and oriented to person, place, and time. No cranial nerve deficit.  No dysarthria, no  aphasia  Skin: Skin is warm and dry. No rash noted. No erythema.  Psychiatric: She has a normal mood and affect. Her behavior is normal. Judgment and thought content normal.    LABORATORY PANEL:   CBC  Recent Labs Lab 10/04/16 1813  WBC 30.0*  HGB 14.0  HCT 43.4  PLT 225   ------------------------------------------------------------------------------------------------------------------  Chemistries   Recent Labs Lab 10/04/16 1813  NA 131*  K 4.2  CL 91*  CO2 34*  GLUCOSE 103*  BUN 9  CREATININE 0.66  CALCIUM 8.5*   ------------------------------------------------------------------------------------------------------------------  Cardiac Enzymes  Recent Labs Lab 10/04/16 1813  TROPONINI <0.03   ------------------------------------------------------------------------------------------------------------------  RADIOLOGY:  Ct Angio Chest Pe W Or Wo Contrast  Result Date: 10/04/2016 CLINICAL DATA:  Worsening shortness of breath and weakness. History of chronic lymphocytic leukemia and thyroid cancer. EXAM: CT ANGIOGRAPHY CHEST WITH CONTRAST TECHNIQUE: Multidetector CT imaging of the chest was performed using the standard protocol during bolus administration of intravenous contrast. Multiplanar CT image reconstructions and MIPs were obtained to evaluate the vascular anatomy. CONTRAST:  75 mL Isovue 370 COMPARISON:  Chest radiographs 05/22/2016.  PET-CT 10/03/2014. FINDINGS: Cardiovascular: Pulmonary arterial opacification is adequate, however respiratory motion artifact limits assessment of the subsegmental and some segmental branches, particularly in the lower lobes. No definite pulmonary emboli are identified within this limitation. Mild ascending aortic ectasia has not significantly changed from the prior PET-CT, measuring 4.3 cm (previously 4.2 cm). There is extensive thoracic aortic atherosclerosis. Coronary artery atherosclerosis is also noted. No pericardial effusion.  Mediastinum/Nodes: Subpectoral and axillary lymphadenopathy is again seen. Right axillary lymph nodes measure up to 11 mm in short axis, similar to the prior PET-CT. Left axillary lymph nodes have decreased in size, currently measuring up to 14 mm in short axis (previously 29 mm). Mediastinal and hilar lymph nodes have mildly enlarged and measure up to 1 cm in short axis. Lungs/Pleura: No pleural effusion or pneumothorax. Evaluation of the lung parenchyma is mildly limited by respiratory motion artifact. There are patchy areas of ground-glass opacity bilaterally, involving the upper lobes greater than lower lobes. Underlying centrilobular emphysema is again noted. No lung mass. Upper Abdomen: Partially visualized splenomegaly. Musculoskeletal: No suspicious lytic or blastic osseous lesion. Small, scattered Schmorl's nodes in the spine. Review of the MIP images confirms the above findings. IMPRESSION: 1. Motion degraded examination. No proximal segmental or more central pulmonary emboli identified. 2. Axillary and subpectoral lymphadenopathy consistent with history of leukemia. Left axillary lymphadenopathy has improved from 2015. 3. Mild mediastinal and bilateral hilar lymphadenopathy. 4. Ground-glass opacities in both lungs. The differential diagnosis is broad and includes pneumonia (including viral and opportunistic infection) as well as noninfectious inflammatory causes. 5. Aortic atherosclerosis. Electronically Signed   By: Logan Bores M.D.   On: 10/04/2016 20:36    EKG:   Orders placed or performed during the hospital encounter of 10/04/16  . EKG 12-Lead  . EKG 12-Lead  . ED EKG  . ED EKG    IMPRESSION AND PLAN:  Principal Problem:   CAP (community acquired pneumonia) - antibiotics started in the ED and continued on admission. When necessary duo nebs, supplemental oxygen for now. Active Problems:   CLL (chronic lymphocytic leukemia) ([place]) - monitor by oncology in the outpatient setting. White  count is currently stable, though this certainly makes upfront infectious evaluation more difficult. Continue to monitor   Essential hypertension - stable, continue home meds   Postsurgical hypothyroidism - home dose thyroid replacement   Hyperlipidemia - continue home meds  All the records are reviewed and case discussed with ED provider. Management plans discussed with the patient and/or family.  DVT PROPHYLAXIS: SubQ lovenox  GI PROPHYLAXIS: None  ADMISSION STATUS: Inpatient  CODE STATUS: Full Code Status History    This patient does not have a recorded code status. Please follow your organizational policy for patients in this situation.    Advance Directive Documentation   Flowsheet Row Most Recent Value  Type of Advance Directive  Healthcare Power of Attorney  Pre-existing out of facility DNR order (yellow form or pink MOST form)  No data  "MOST" Form in Place?  No data      TOTAL TIME TAKING CARE OF THIS PATIENT: 45 minutes.    Shaquina Gillham,  Marcelle Hepner FIELDING 10/04/2016, 9:57 PM  [facility]  Office  [phone]  CC: Primary care physician; Crecencio Mc, MD

## 2016-10-04 NOTE — ED Notes (Signed)
Pt assisted to bedpan and was able to produce urine.

## 2016-10-04 NOTE — ED Provider Notes (Signed)
Time Seen: Approximately 2006  I have reviewed the triage notes  Chief Complaint: Weakness   History of Present Illness: Lisa Figueroa is a 82 y.o. female who presents with some generalized weakness now for overall month with decreased appetite. Patient has a history of chronic lymphocytic leukemia. She is also status post thyroidectomy. The patient states that she hasn't noticed any shortness of breath but seemed to be somewhat today And noted to be hypoxic on pulse ox symmetry check. Patient   Past Medical History:  Diagnosis Date  . CLL (chronic lymphocytic leukemia) ([place])   . History of colon polyps   . History of shingles   . Hyperlipidemia   . Hypertension   . Post herpetic neuralgia   . Shingles   . Thyroid cancer ([place]) 1998  . Thyroid cancer [facility])     Patient Active Problem List   Diagnosis Date Noted  . Allergic rhinitis 07/03/2016  . Hyponatremia 05/26/2016  . CLL (chronic lymphocytic leukemia) ([place]) 02/02/2016  . History of bad fall 06/20/2015  . Obesity 06/20/2015  . Medicare annual wellness visit, subsequent 05/22/2015  . Essential hypertension 11/19/2014  . Hyperlipidemia 11/19/2014  . Osteoporosis 11/18/2014  . S/P thyroidectomy 11/18/2014  . Postsurgical hypothyroidism 11/18/2014  . Post-herpetic trigeminal neuralgia 11/15/2014  . Need for prophylactic vaccination against Streptococcus pneumoniae (pneumococcus) 08/08/2014  . Abnormal mammogram 05/18/2013    Past Surgical History:  Procedure Laterality Date  . ABDOMINAL HYSTERECTOMY    . APPENDECTOMY    . BREAST SURGERY Right    cyst excision  . CARDIAC CATHETERIZATION  05/18/2008   At [facility]: mild 30% stenosis in LCX. normal EF.    Marland Kitchen CATARACT EXTRACTION W/PHACO Left 06/04/2015   Procedure: CATARACT EXTRACTION PHACO AND INTRAOCULAR LENS PLACEMENT (IOC);  Surgeon: Birder Robson, MD;  Location: [facility] ORS;  Service: Ophthalmology;  Laterality: Left;  [place] 00:34.1 AP% 23.0 CDE 7.85  . CHOLECYSTECTOMY     . HEMORRHOID SURGERY    . TOTAL THYROIDECTOMY  1998    Past Surgical History:  Procedure Laterality Date  . ABDOMINAL HYSTERECTOMY    . APPENDECTOMY    . BREAST SURGERY Right    cyst excision  . CARDIAC CATHETERIZATION  05/18/2008   At [facility]: mild 30% stenosis in LCX. normal EF.    Marland Kitchen CATARACT EXTRACTION W/PHACO Left 06/04/2015   Procedure: CATARACT EXTRACTION PHACO AND INTRAOCULAR LENS PLACEMENT (IOC);  Surgeon: Birder Robson, MD;  Location: [facility] ORS;  Service: Ophthalmology;  Laterality: Left;  [place] 00:34.1 AP% 23.0 CDE 7.85  . CHOLECYSTECTOMY    . HEMORRHOID SURGERY    . TOTAL THYROIDECTOMY  1998    Current Outpatient Rx  . Order #: [id number] Class: Historical Med  . Order #: [id number] Class: Normal  . Order #: [id number] Class: Historical Med  . Order #: [id number] Class: Normal  . Order #: [id number] Class: Normal  . Order #: [id number] Class: Normal    Allergies:  Penicillin g and Prednisone  Family History: Family History  Problem Relation Age of Onset  . Cancer Brother     prostate and bone  . Heart disease Mother   . Heart disease Father   . Cancer Maternal Aunt     breast  . Cancer Cousin     breast - maternal side    Social History: Social History  Substance Use Topics  . Smoking status: Former Smoker    Packs/day: 1.00    Years: 20.00    Quit date: 08/08/1984  . Smokeless tobacco: Never Used  .  Alcohol use 0.0 oz/week     Comment: wine glass daily     Review of Systems:   10 point review of systems was performed and was otherwise negative:  Constitutional: No fever Eyes: No visual disturbances ENT: No sore throat, ear pain Cardiac: No chest pain Respiratory: No shortness of breath, wheezing, or stridor Abdomen: No abdominal pain, no vomiting, No diarrhea Endocrine: No weight loss, No night sweats Extremities: No peripheral edema, cyanosis Skin: No rashes, easy bruising Neurologic: No focal weakness, trouble with speech or swollowing Urologic:  No dysuria, Hematuria, or urinary frequency   Physical Exam:  ED Triage Vitals  Enc Vitals Group     BP 10/04/16 1805 (!) 155/92     Pulse Rate 10/04/16 1805 83     Resp 10/04/16 1805 18     Temp 10/04/16 1805 97.7 F (36.5 C)     Temp Source 10/04/16 1805 Oral     SpO2 10/04/16 1805 96 %     Weight 10/04/16 1806 155 lb (70.3 kg)     Height 10/04/16 1806 5\' 2"  (1.575 m)     Head Circumference --      Peak Flow --      Pain Score --      Pain Loc --      Pain Edu? --      Excl. in [place]? --     General: Awake , Alert , and Oriented times 3; GCS 15 patient appears to be slightly tachypnea Head: Normal cephalic , atraumatic Eyes: Pupils equal , round, reactive to light Nose/Throat: No nasal drainage, patent upper airway without erythema or exudate.  Neck: Supple, Full range of motion, No anterior adenopathy or palpable thyroid masses Lungs: Patient has some what sounds posteriorly up at the apices without any obvious wheezes or rhonchi Heart: Regular rate, regular rhythm without murmurs , gallops , or rubs Abdomen: Soft, non tender without rebound, guarding , or rigidity; bowel sounds positive and symmetric in all 4 quadrants. No organomegaly .        Extremities: 2 plus symmetric pulses. No edema, clubbing or cyanosis Neurologic:  Motor symmetric without deficits, sensory intact Skin: warm, dry, no rashes   Labs:   All laboratory work was reviewed including any pertinent negatives or positives listed below:  Labs Reviewed  BASIC METABOLIC PANEL - Abnormal; Notable for the following:       Result Value   Sodium 131 (*)    Chloride 91 (*)    CO2 34 (*)    Glucose, Bld 103 (*)    Calcium 8.5 (*)    All other components within normal limits  CBC - Abnormal; Notable for the following:    WBC 30.0 (*)    All other components within normal limits  URINALYSIS COMPLETEWITH MICROSCOPIC ([facility] ONLY) - Abnormal; Notable for the following:    Color, Urine YELLOW (*)    APPearance  CLEAR (*)    Hgb urine dipstick 1+ (*)    Squamous Epithelial / LPF 0-5 (*)    All other components within normal limits  BRAIN NATRIURETIC PEPTIDE - Abnormal; Notable for the following:    B Natriuretic Peptide 177.0 (*)    All other components within normal limits  CULTURE, BLOOD (ROUTINE X 2)  CULTURE, BLOOD (ROUTINE X 2)  TROPONIN I  TSH  As far laboratory work appears to be within normal limits blood cultures and lactic acid level pending  EKG:  ED ECG REPORT I, Aaron Edelman  Theodosia Paling, the attending physician, personally viewed and interpreted this ECG.  Date: 10/04/2016 EKG Time: 1810 Rate: 83 Rhythm: normal sinus rhythm QRS Axis: normal Intervals: normal ST/T Wave abnormalities: normal Conduction Disturbances: none Narrative Interpretation: unremarkable Normal EKG   Radiology:  "Ct Angio Chest Pe W Or Wo Contrast  Result Date: 10/04/2016 CLINICAL DATA:  Worsening shortness of breath and weakness. History of chronic lymphocytic leukemia and thyroid cancer. EXAM: CT ANGIOGRAPHY CHEST WITH CONTRAST TECHNIQUE: Multidetector CT imaging of the chest was performed using the standard protocol during bolus administration of intravenous contrast. Multiplanar CT image reconstructions and MIPs were obtained to evaluate the vascular anatomy. CONTRAST:  75 mL Isovue 370 COMPARISON:  Chest radiographs 05/22/2016.  PET-CT 10/03/2014. FINDINGS: Cardiovascular: Pulmonary arterial opacification is adequate, however respiratory motion artifact limits assessment of the subsegmental and some segmental branches, particularly in the lower lobes. No definite pulmonary emboli are identified within this limitation. Mild ascending aortic ectasia has not significantly changed from the prior PET-CT, measuring 4.3 cm (previously 4.2 cm). There is extensive thoracic aortic atherosclerosis. Coronary artery atherosclerosis is also noted. No pericardial effusion. Mediastinum/Nodes: Subpectoral and axillary  lymphadenopathy is again seen. Right axillary lymph nodes measure up to 11 mm in short axis, similar to the prior PET-CT. Left axillary lymph nodes have decreased in size, currently measuring up to 14 mm in short axis (previously 29 mm). Mediastinal and hilar lymph nodes have mildly enlarged and measure up to 1 cm in short axis. Lungs/Pleura: No pleural effusion or pneumothorax. Evaluation of the lung parenchyma is mildly limited by respiratory motion artifact. There are patchy areas of ground-glass opacity bilaterally, involving the upper lobes greater than lower lobes. Underlying centrilobular emphysema is again noted. No lung mass. Upper Abdomen: Partially visualized splenomegaly. Musculoskeletal: No suspicious lytic or blastic osseous lesion. Small, scattered Schmorl's nodes in the spine. Review of the MIP images confirms the above findings. IMPRESSION: 1. Motion degraded examination. No proximal segmental or more central pulmonary emboli identified. 2. Axillary and subpectoral lymphadenopathy consistent with history of leukemia. Left axillary lymphadenopathy has improved from 2015. 3. Mild mediastinal and bilateral hilar lymphadenopathy. 4. Ground-glass opacities in both lungs. The differential diagnosis is broad and includes pneumonia (including viral and opportunistic infection) as well as noninfectious inflammatory causes. 5. Aortic atherosclerosis. Electronically Signed   By: Logan Bores M.D.   On: 10/04/2016 20:36  "  I personally reviewed the radiologic studies    ED Course: * Patient's stay here was uneventful and her pulse ox improved to 96% on a 2 L nasal cannula. I placed an order for a breathing treatment and felt that we should do a CAT scan to evaluate the patient would give [place] more information than a plain x-ray. The patient appears to have groundglass appearing abnormalities at the apices bilaterally which may be pneumonia. Clinically the patient does not have a lot of symptoms pointing  toward pneumonia other than just generalized weakness. Patient was established on Levaquin.  Clinical Course     Assessment: * Community-acquired pneumonia Generalized weakness History of chronic lymphocytic leukemia   Final Clinical Impression: * * Final diagnoses:  Shortness of breath  Generalized weakness  Hypoxia  Community acquired pneumonia, unspecified laterality     Plan: * Inpatient management           Daymon Larsen, MD 10/04/16 2102

## 2016-10-05 DIAGNOSIS — E440 Moderate protein-calorie malnutrition: Secondary | ICD-10-CM | POA: Insufficient documentation

## 2016-10-05 LAB — BASIC METABOLIC PANEL
Anion gap: 7 (ref 5–15)
BUN: 7 mg/dL (ref 6–20)
CHLORIDE: 93 mmol/L — AB (ref 101–111)
CO2: 32 mmol/L (ref 22–32)
Calcium: 8.3 mg/dL — ABNORMAL LOW (ref 8.9–10.3)
Creatinine, Ser: 0.64 mg/dL (ref 0.44–1.00)
GFR calc non Af Amer: >60 mL/min (ref >60)
Glucose, Bld: 94 mg/dL (ref 65–99)
POTASSIUM: 4.2 mmol/L (ref 3.5–5.1)
SODIUM: 132 mmol/L — AB (ref 135–145)

## 2016-10-05 LAB — CBC
HEMATOCRIT: 42.6 % (ref 35.0–47.0)
Hemoglobin: 13.6 g/dL (ref 12.0–16.0)
MCH: 27.5 pg (ref 26.0–34.0)
MCHC: 31.8 g/dL — ABNORMAL LOW (ref 32.0–36.0)
MCV: 86.5 fL (ref 80.0–100.0)
Platelets: 213 10*3/uL (ref 150–440)
RBC: 4.92 MIL/uL (ref 3.80–5.20)
RDW: 13.7 % (ref 11.5–14.5)
WBC: 26.1 10*3/uL — AB (ref 3.6–11.0)

## 2016-10-05 MED ORDER — ENSURE ENLIVE PO LIQD
237.0000 mL | Freq: Two times a day (BID) | ORAL | Status: DC
  Administered 2016-10-05 – 2016-10-07 (×4): 237 mL via ORAL

## 2016-10-05 NOTE — Evaluation (Signed)
Physical Therapy Evaluation Patient Details Name: Lisa Figueroa MRN: [id number] DOB: 1932-10-03 Today's Date: 10/05/2016   History of Present Illness  Pt admitted for complaints of weakness for 2-3 days. Pt now with penumonia. Pt with history of chronic lymphoetic leukemia, shingles, HTN, and thyroid cancer. Pt with negative testing for PE this date.  Clinical Impression  Pt is a pleasant 82 year old female who was admitted for pneumonia. Pt performs bed mobility with mod I and transfers/ambulation with cga and RW. All mobility performed on 1L of O2 with sats WNL. No SOB symptoms noted, however pt fatigues with increased exertion. Pt demonstrates deficits with strength/endurance/mobility. Pt very motivated to return home. Would benefit from skilled PT to address above deficits and promote optimal return to PLOF. Recommend transition to [place] upon discharge from acute hospitalization.       Follow Up Recommendations Home health PT    Equipment Recommendations  None recommended by PT    Recommendations for Other Services       Precautions / Restrictions Precautions Precautions: Fall Restrictions Weight Bearing Restrictions: No      Mobility  Bed Mobility Overal bed mobility: Modified Independent             General bed mobility comments: assist for using bedrail, however safe technique performed. Once seated at EOB, pt able to sit with upright posture. All mobility peformed with 1L of O2.  Transfers Overall transfer level: Needs assistance Equipment used: Rolling walker (2 wheeled) Transfers: Sit to/from Stand Sit to Stand: Min guard         General transfer comment: Safe technique with RW. Upright posture noted.  Ambulation/Gait Ambulation/Gait assistance: Min guard Ambulation Distance (Feet): 110 Feet Assistive device: Rolling walker (2 wheeled) Gait Pattern/deviations: Step-through pattern     General Gait Details: Pt ambulated using RW and reciprocal gait  pattern. Safe technique performed with no LOB or SOB symptoms noted. Pt on 1L of O2 for all mobility with sats >90%. Pt self limited in ambulation distance  Stairs            Wheelchair Mobility    Modified Rankin (Stroke Patients Only)       Balance Overall balance assessment: Needs assistance Sitting-balance support: Feet supported Sitting balance-Leahy Scale: Normal     Standing balance support: Bilateral upper extremity supported Standing balance-Leahy Scale: Good                               Pertinent Vitals/Pain Pain Assessment: No/denies pain    Home Living Family/patient expects to be discharged to:: Private residence Living Arrangements: Alone Available Help at Discharge: Family (daughter lives in [place]) Type of Home: House Home Access: Level entry     Home Layout: One level Home Equipment: Environmental consultant - 2 wheels;Cane - single point Additional Comments: uses RW indoors and SPC outdoors    Prior Function Level of Independence: Independent with assistive device(s)         Comments: Pt reports no falls in 2year and is limited community ambulator     Hand Dominance        Extremity/Trunk Assessment   Upper Extremity Assessment: Generalized weakness (B UE grossly 4/5)           Lower Extremity Assessment: Generalized weakness (B LE grossly 4+/5)         Communication   Communication: No difficulties  Cognition Arousal/Alertness: Awake/alert Behavior During Therapy: WFL for tasks  assessed/performed Overall Cognitive Status: Within Functional Limits for tasks assessed                      General Comments      Exercises Other Exercises Other Exercises: HEP written on board and performed x 3-5 reps including B LE SLRs and hip ab/add. All ther-ex performed with supervision and cues given for correct technique.   Assessment/Plan    PT Assessment Patient needs continued PT services  PT Problem List Decreased  strength;Decreased balance;Decreased activity tolerance;Cardiopulmonary status limiting activity          PT Treatment Interventions Gait training;Therapeutic exercise    PT Goals (Current goals can be found in the Care Plan section)  Acute Rehab PT Goals Patient Stated Goal: to go home PT Goal Formulation: With patient Time For Goal Achievement: 10/19/16 Potential to Achieve Goals: Good    Frequency Min 2X/week   Barriers to discharge        Co-evaluation               End of Session Equipment Utilized During Treatment: Gait belt;Oxygen Activity Tolerance: Patient tolerated treatment well Patient left: in bed;with bed alarm set;with family/visitor present Nurse Communication: Mobility status         Time: 1410-1440 PT Time Calculation (min) (ACUTE ONLY): 30 min   Charges:   PT Evaluation $PT Eval Low Complexity: 1 Procedure PT Treatments $Therapeutic Exercise: 8-22 mins   PT G Codes:        Lory Galan 2016-10-23, 2:57 PM  Greggory Stallion, PT, DPT [phone]

## 2016-10-05 NOTE — Progress Notes (Signed)
[place] [facility] [place] NAME: Lisa Figueroa    MRN#:  [id number]  DATE OF BIRTH:  1932-11-08  SUBJECTIVE:  Hospital Day: 1 day Lisa Figueroa is a 82 y.o. female presenting with Weakness .   Overnight events: No acute overnight events Interval Events: Complains of generalized weakness, shortness of breath with activity  REVIEW OF SYSTEMS:  CONSTITUTIONAL: No fever, Positive fatigue or weakness.  EYES: No blurred or double vision.  EARS, NOSE, AND THROAT: No tinnitus or ear pain.  RESPIRATORY: No cough, positive shortness of breath, denies wheezing or hemoptysis.  CARDIOVASCULAR: No chest pain, orthopnea, edema.  GASTROINTESTINAL: No nausea, vomiting, diarrhea or abdominal pain.  GENITOURINARY: No dysuria, hematuria.  ENDOCRINE: No polyuria, nocturia,  HEMATOLOGY: No anemia, easy bruising or bleeding SKIN: No rash or lesion. MUSCULOSKELETAL: No joint pain or arthritis.   NEUROLOGIC: No tingling, numbness, weakness.  PSYCHIATRY: No anxiety or depression.   DRUG ALLERGIES:   Allergies  Allergen Reactions  . Penicillin G Swelling  . Prednisone Other (See Comments)    crys and can't sleep crys and can't sleep    VITALS:  Blood pressure 137/79, pulse 71, temperature 98.2 F (36.8 C), temperature source Oral, resp. rate 20, height 5\' 2"  (1.575 m), weight 71.8 kg (158 lb 6 oz), SpO2 92 %.  PHYSICAL EXAMINATION:  VITAL SIGNS: Vitals:   10/05/16 0450 10/05/16 0636  BP: (!) 153/71 137/79  Pulse: 80 71  Resp: 20   Temp: 98.2 F (36.8 C)    GENERAL:82 y.o.female currently in no acute distress.  HEAD: Normocephalic, atraumatic.  EYES: Pupils equal, round, reactive to light. Extraocular muscles intact. No scleral icterus.  MOUTH: Moist mucosal membrane. Dentition intact. No abscess noted.  EAR, NOSE, THROAT: Clear without exudates. No external lesions.  NECK: Supple. No thyromegaly. No nodules. No JVD.  PULMONARY: Coarse  rhonchi without wheeze . No use of accessory muscles, Good respiratory effort. good air entry bilaterally CHEST: Nontender to palpation.  CARDIOVASCULAR: S1 and S2. Regular rate and rhythm. No murmurs, rubs, or gallops. No edema. Pedal pulses 2+ bilaterally.  GASTROINTESTINAL: Soft, nontender, nondistended. No masses. Positive bowel sounds. No hepatosplenomegaly.  MUSCULOSKELETAL: No swelling, clubbing, or edema. Range of motion full in all extremities.  NEUROLOGIC: Cranial nerves II through XII are intact. No gross focal neurological deficits. Sensation intact. Reflexes intact.  SKIN: No ulceration, lesions, rashes, or cyanosis. Skin warm and dry. Turgor intact.  PSYCHIATRIC: Mood, affect within normal limits. The patient is awake, alert and oriented x 3. Insight, judgment intact.      LABORATORY PANEL:   CBC  Recent Labs Lab 10/05/16 0450  WBC 26.1*  HGB 13.6  HCT 42.6  PLT 213   ------------------------------------------------------------------------------------------------------------------  Chemistries   Recent Labs Lab 10/05/16 0450  NA 132*  K 4.2  CL 93*  CO2 32  GLUCOSE 94  BUN 7  CREATININE 0.64  CALCIUM 8.3*   ------------------------------------------------------------------------------------------------------------------  Cardiac Enzymes  Recent Labs Lab 10/04/16 1813  TROPONINI <0.03   ------------------------------------------------------------------------------------------------------------------  RADIOLOGY:  Ct Angio Chest Pe W Or Wo Contrast  Result Date: 10/04/2016 CLINICAL DATA:  Worsening shortness of breath and weakness. History of chronic lymphocytic leukemia and thyroid cancer. EXAM: CT ANGIOGRAPHY CHEST WITH CONTRAST TECHNIQUE: Multidetector CT imaging of the chest was performed using the standard protocol during bolus administration of intravenous contrast. Multiplanar CT image reconstructions and MIPs were obtained to evaluate the  vascular anatomy. CONTRAST:  75 mL Isovue 370  COMPARISON:  Chest radiographs 05/22/2016.  PET-CT 10/03/2014. FINDINGS: Cardiovascular: Pulmonary arterial opacification is adequate, however respiratory motion artifact limits assessment of the subsegmental and some segmental branches, particularly in the lower lobes. No definite pulmonary emboli are identified within this limitation. Mild ascending aortic ectasia has not significantly changed from the prior PET-CT, measuring 4.3 cm (previously 4.2 cm). There is extensive thoracic aortic atherosclerosis. Coronary artery atherosclerosis is also noted. No pericardial effusion. Mediastinum/Nodes: Subpectoral and axillary lymphadenopathy is again seen. Right axillary lymph nodes measure up to 11 mm in short axis, similar to the prior PET-CT. Left axillary lymph nodes have decreased in size, currently measuring up to 14 mm in short axis (previously 29 mm). Mediastinal and hilar lymph nodes have mildly enlarged and measure up to 1 cm in short axis. Lungs/Pleura: No pleural effusion or pneumothorax. Evaluation of the lung parenchyma is mildly limited by respiratory motion artifact. There are patchy areas of ground-glass opacity bilaterally, involving the upper lobes greater than lower lobes. Underlying centrilobular emphysema is again noted. No lung mass. Upper Abdomen: Partially visualized splenomegaly. Musculoskeletal: No suspicious lytic or blastic osseous lesion. Small, scattered Schmorl's nodes in the spine. Review of the MIP images confirms the above findings. IMPRESSION: 1. Motion degraded examination. No proximal segmental or more central pulmonary emboli identified. 2. Axillary and subpectoral lymphadenopathy consistent with history of leukemia. Left axillary lymphadenopathy has improved from 2015. 3. Mild mediastinal and bilateral hilar lymphadenopathy. 4. Ground-glass opacities in both lungs. The differential diagnosis is broad and includes pneumonia (including  viral and opportunistic infection) as well as noninfectious inflammatory causes. 5. Aortic atherosclerosis. Electronically Signed   By: Logan Bores M.D.   On: 10/04/2016 20:36    EKG:   Orders placed or performed during the hospital encounter of 10/04/16  . EKG 12-Lead  . EKG 12-Lead  . ED EKG  . ED EKG    ASSESSMENT AND PLAN:   Eralynn Tewolde is a 82 y.o. female presenting with Weakness . Admitted 10/04/2016 : Day #: 1 day 1. Acute respiratory failure with hypoxia: Community-acquired pneumonia: Continue supplemental oxygen to keep O2 saturations greater than 92%, continue current antibiotic regimen day #2/5 continue breathing treatments 2. Essential hypertension: Continue home medications 3. Hypothyroidism unspecified: Synthroid 4. Hyperlipidemia unspecified: Statin therapy  Disposition: Physical therapy evaluation, discontinue telemetry  All the records are reviewed and case discussed with Care Management/Social Workerr. Management plans discussed with the patient, family and they are in agreement.  CODE STATUS: full TOTAL TIME TAKING CARE OF THIS PATIENT: 28 minutes.   POSSIBLE D/C IN 1-2DAYS, DEPENDING ON CLINICAL CONDITION.   Hower,  Karenann Cai.D on 10/05/2016 at 11:52 AM  Between 7am to 6pm - Pager - [phone]  After 6pm: House Pager: - [phone]  [facility]  Office  [phone]  CC: Primary care physician; Crecencio Mc, MD

## 2016-10-05 NOTE — Progress Notes (Signed)
Initial Nutrition Assessment  DOCUMENTATION CODES:   Non-severe (moderate) malnutrition in context of acute illness/injury  INTERVENTION:  -Recommend liberalizing diet to regular at this time with poor po intake and wt loss prior to admission.   -Recommend Ensure Enlive po BID, each supplement provides 350 kcal and 20 grams of protein -Discussed small frequent meals, high calorie high protein foods.     NUTRITION DIAGNOSIS:   Malnutrition related to acute illness as evidenced by percent weight loss, mild depletion of body fat, mild depletion of muscle mass, moderate depletions of muscle mass, energy intake < 75% for > 7 days.    GOAL:   Patient will meet greater than or equal to 90% of their needs    MONITOR:   PO intake, Supplement acceptance, Weight trends  REASON FOR ASSESSMENT:   Malnutrition Screening Tool    ASSESSMENT:      82 y.o. Female admitted with weakness, cough, hypoxia, pneumonia.  Pt with history of CLL, HTN, hypothyroidism, HLD, thyroid cancer  Pt reports for the past month following taking an antibiotic poor po intake, eating bites.  Ate 1/2 of hard boiled egg this am and nothing else.  Reports would have eaten more but did not have salt.   Pt reports over the last month has lost 8 pounds due to poor po intake. 5% wt loss in the last month  Nutrition-Focused physical exam completed. Findings are mild fat depletion, mild/moderate muscle depletion, and no edema.    Medications reviewed:  Labs reviewed: Na 132, WBC 26.1  Diet Order:  Diet regular Room service appropriate? Yes; Fluid consistency: Thin  Skin:  Reviewed, no issues  Last BM:  10/8  Height:   Ht Readings from Last 1 Encounters:  10/04/16 5\' 2"  (1.575 m)    Weight:   Wt Readings from Last 1 Encounters:  10/04/16 158 lb 6 oz (71.8 kg)    Ideal Body Weight:     BMI:  Body mass index is 28.97 kg/m.  Estimated Nutritional Needs:   Kcal:  1600-1800 kcals/d  Protein:   86-108 g/d  Fluid:  >/= 1.6 L/d  EDUCATION NEEDS:   Education needs addressed  Lissette Schenk B. Zenia Resides, [place], [place] (pager) Weekend/On-Call pager [phone])

## 2016-10-06 LAB — CBC
HEMATOCRIT: 42.3 % (ref 35.0–47.0)
Hemoglobin: 13.6 g/dL (ref 12.0–16.0)
MCH: 27.5 pg (ref 26.0–34.0)
MCHC: 32.2 g/dL (ref 32.0–36.0)
MCV: 85.3 fL (ref 80.0–100.0)
Platelets: 201 10*3/uL (ref 150–440)
RBC: 4.97 MIL/uL (ref 3.80–5.20)
RDW: 13.4 % (ref 11.5–14.5)
WBC: 23.3 10*3/uL — AB (ref 3.6–11.0)

## 2016-10-06 LAB — BASIC METABOLIC PANEL
ANION GAP: 6 (ref 5–15)
BUN: 7 mg/dL (ref 6–20)
CHLORIDE: 93 mmol/L — AB (ref 101–111)
CO2: 32 mmol/L (ref 22–32)
Calcium: 8.5 mg/dL — ABNORMAL LOW (ref 8.9–10.3)
Creatinine, Ser: 0.56 mg/dL (ref 0.44–1.00)
GFR calc Af Amer: >60 mL/min (ref >60)
GFR calc non Af Amer: >60 mL/min (ref >60)
GLUCOSE: 90 mg/dL (ref 65–99)
POTASSIUM: 4.4 mmol/L (ref 3.5–5.1)
Sodium: 131 mmol/L — ABNORMAL LOW (ref 135–145)

## 2016-10-06 MED ORDER — MIRTAZAPINE 15 MG PO TABS
15.0000 mg | ORAL_TABLET | Freq: Every day | ORAL | Status: DC
  Administered 2016-10-06: 15 mg via ORAL
  Filled 2016-10-06: qty 1

## 2016-10-06 MED ORDER — LEVOFLOXACIN 750 MG PO TABS
750.0000 mg | ORAL_TABLET | Freq: Every day | ORAL | Status: DC
  Administered 2016-10-06: 18:00:00 750 mg via ORAL
  Filled 2016-10-06: qty 1

## 2016-10-06 NOTE — Progress Notes (Signed)
Physical Therapy Treatment Patient Details Name: KAYRON SCHAAN MRN: [id number] DOB: 11-Aug-1932 Today's Date: 10/06/2016    History of Present Illness Pt admitted for complaints of weakness for 2-3 days. Pt now with penumonia. Pt with history of chronic lymphoetic leukemia, shingles, HTN, and thyroid cancer. Pt with negative testing for PE this date.    PT Comments    Pt is able to ambulate a full lap around RN station with therapist on this date. She does desaturate on room air however SaO2 is able to recover to >90% on room air with extended rest breaks and pursed lip breathing. However she continues to desturate quickly with exertion so upplemental O2 utilized to maintain SaO2>90% with exertion.   Pt reports fatigue during ambulation and requires intermittent rest breaks. She is able to complete all seated exercises as instructed. Pt will benefit from skilled PT services to address deficits in strength, balance, and mobility in order to return to full function at home.   SaO2 on room air at rest = 95% SaO2 on room air while ambulating = 85% SaO2 on 2 liters of O2 while ambulating = 92%    Follow Up Recommendations  Home health PT     Equipment Recommendations  None recommended by PT    Recommendations for Other Services       Precautions / Restrictions Precautions Precautions: None Restrictions Weight Bearing Restrictions: No    Mobility  Bed Mobility Overal bed mobility: Modified Independent             General bed mobility comments: Slight increase in time required but pt able to come to sitting without assistance.   Transfers Overall transfer level: Needs assistance Equipment used: Rolling walker (2 wheeled) Transfers: Sit to/from Stand Sit to Stand: Min guard         General transfer comment: Safe technique with RW. Upright posture noted.  Ambulation/Gait Ambulation/Gait assistance: Min guard Ambulation Distance (Feet): 250 Feet Assistive device:  Rolling walker (2 wheeled) Gait Pattern/deviations: Decreased step length - left;Decreased stance time - right Gait velocity: Decreased but functional for household ambulation Gait velocity interpretation: <1.8 ft/sec, indicative of risk for recurrent falls General Gait Details: Pt able to ambulate a full lap around RN station. Gait speed is decreased but functional for household ambulation. Vitals monitored during ambulation. Initially on room air at rest SaO2 is 95%. With exertion SaO2 drops to 85% on room air. Standing rest break performed with pursed lip breathing and SaO2 recovers to 90% but requires approximately 45-60 seconds. After multiple bouts of desaturation supplemental O2 applied at 2L/min and ambulation completed. SaO2 remains at 92% for additional ambulation with supplemental O2   Stairs            Wheelchair Mobility    Modified Rankin (Stroke Patients Only)       Balance Overall balance assessment: Needs assistance Sitting-balance support: Feet supported Sitting balance-Leahy Scale: Normal     Standing balance support: No upper extremity supported Standing balance-Leahy Scale: Fair                      Cognition Arousal/Alertness: Awake/alert Behavior During Therapy: WFL for tasks assessed/performed Overall Cognitive Status: Within Functional Limits for tasks assessed                      Exercises General Exercises - Lower Extremity Long Arc Quad: Strengthening;Both;10 reps;Seated Heel Slides: Strengthening;Both;10 reps;Seated Hip ABduction/ADduction: Strengthening;Both;10 reps;Seated Hip Flexion/Marching: Strengthening;Both;10 reps;Seated  Heel Raises: Strengthening;Both;10 reps;Seated    General Comments        Pertinent Vitals/Pain Pain Assessment: No/denies pain    Home Living                      Prior Function            PT Goals (current goals can now be found in the care plan section) Acute Rehab PT  Goals Patient Stated Goal: to go home PT Goal Formulation: With patient Time For Goal Achievement: 10/19/16 Potential to Achieve Goals: Good Progress towards PT goals: Progressing toward goals    Frequency    Min 2X/week      PT Plan Current plan remains appropriate    Co-evaluation             End of Session Equipment Utilized During Treatment: Gait belt;Oxygen Activity Tolerance: Patient tolerated treatment well Patient left: with family/visitor present;in chair;with call bell/phone within reach;with chair alarm set     Time: [id number] PT Time Calculation (min) (ACUTE ONLY): 28 min  Charges:  $Gait Training: 8-22 mins $Therapeutic Exercise: 8-22 mins                    G Codes:      Lyndel Safe Tymeer Vaquera PT, DPT   Aelyn Stanaland 10/06/2016, 11:28 AM

## 2016-10-06 NOTE — Progress Notes (Signed)
[facility] [place] NAME: Lisa Figueroa    MRN#:  [id number]  DATE OF BIRTH:  April 18, 1932  SUBJECTIVE:  Hospital Day: 2 days Lisa Figueroa is a 82 y.o. female presenting with Weakness .   Overnight events: No acute overnight events Interval Events: Complains of generalized weakness, shortness of breath with activityBut overall improvement  REVIEW OF SYSTEMS:  CONSTITUTIONAL: No fever, Positive fatigue or weakness.  EYES: No blurred or double vision.  EARS, NOSE, AND THROAT: No tinnitus or ear pain.  RESPIRATORY: No cough, positive shortness of breath, denies wheezing or hemoptysis.  CARDIOVASCULAR: No chest pain, orthopnea, edema.  GASTROINTESTINAL: No nausea, vomiting, diarrhea or abdominal pain.  GENITOURINARY: No dysuria, hematuria.  ENDOCRINE: No polyuria, nocturia,  HEMATOLOGY: No anemia, easy bruising or bleeding SKIN: No rash or lesion. MUSCULOSKELETAL: No joint pain or arthritis.   NEUROLOGIC: No tingling, numbness, weakness.  PSYCHIATRY: No anxiety or depression.   DRUG ALLERGIES:   Allergies  Allergen Reactions  . Penicillin G Swelling  . Prednisone Other (See Comments)    crys and can't sleep crys and can't sleep    VITALS:  Blood pressure 117/70, pulse 76, temperature 98.6 F (37 C), temperature source Oral, resp. rate 18, height 5\' 2"  (1.575 m), weight 71.8 kg (158 lb 6 oz), SpO2 98 %.  PHYSICAL EXAMINATION:  VITAL SIGNS: Vitals:   10/06/16 0455 10/06/16 1300  BP: 124/75 117/70  Pulse: 74 76  Resp:  18  Temp: 97.5 F (36.4 C) 98.6 F (37 C)   GENERAL:82 y.o.female currently in no acute distress.  HEAD: Normocephalic, atraumatic.  EYES: Pupils equal, round, reactive to light. Extraocular muscles intact. No scleral icterus.  MOUTH: Moist mucosal membrane. Dentition intact. No abscess noted.  EAR, NOSE, THROAT: Clear without exudates. No external lesions.  NECK: Supple. No thyromegaly. No  nodules. No JVD.  PULMONARY: Improved Coarse rhonchi without wheeze . No use of accessory muscles, Good respiratory effort. good air entry bilaterally CHEST: Nontender to palpation.  CARDIOVASCULAR: S1 and S2. Regular rate and rhythm. No murmurs, rubs, or gallops. No edema. Pedal pulses 2+ bilaterally.  GASTROINTESTINAL: Soft, nontender, nondistended. No masses. Positive bowel sounds. No hepatosplenomegaly.  MUSCULOSKELETAL: No swelling, clubbing, or edema. Range of motion full in all extremities.  NEUROLOGIC: Cranial nerves II through XII are intact. No gross focal neurological deficits. Sensation intact. Reflexes intact.  SKIN: No ulceration, lesions, rashes, or cyanosis. Skin warm and dry. Turgor intact.  PSYCHIATRIC: Mood, affect within normal limits. The patient is awake, alert and oriented x 3. Insight, judgment intact.      LABORATORY PANEL:   CBC  Recent Labs Lab 10/06/16 0528  WBC 23.3*  HGB 13.6  HCT 42.3  PLT 201   ------------------------------------------------------------------------------------------------------------------  Chemistries   Recent Labs Lab 10/06/16 0528  NA 131*  K 4.4  CL 93*  CO2 32  GLUCOSE 90  BUN 7  CREATININE 0.56  CALCIUM 8.5*   ------------------------------------------------------------------------------------------------------------------  Cardiac Enzymes  Recent Labs Lab 10/04/16 1813  TROPONINI <0.03   ------------------------------------------------------------------------------------------------------------------  RADIOLOGY:  Ct Angio Chest Pe W Or Wo Contrast  Result Date: 10/04/2016 CLINICAL DATA:  Worsening shortness of breath and weakness. History of chronic lymphocytic leukemia and thyroid cancer. EXAM: CT ANGIOGRAPHY CHEST WITH CONTRAST TECHNIQUE: Multidetector CT imaging of the chest was performed using the standard protocol during bolus administration of intravenous contrast. Multiplanar CT image  reconstructions and MIPs were obtained to evaluate the vascular anatomy. CONTRAST:  75 mL Isovue 370 COMPARISON:  Chest radiographs 05/22/2016.  PET-CT 10/03/2014. FINDINGS: Cardiovascular: Pulmonary arterial opacification is adequate, however respiratory motion artifact limits assessment of the subsegmental and some segmental branches, particularly in the lower lobes. No definite pulmonary emboli are identified within this limitation. Mild ascending aortic ectasia has not significantly changed from the prior PET-CT, measuring 4.3 cm (previously 4.2 cm). There is extensive thoracic aortic atherosclerosis. Coronary artery atherosclerosis is also noted. No pericardial effusion. Mediastinum/Nodes: Subpectoral and axillary lymphadenopathy is again seen. Right axillary lymph nodes measure up to 11 mm in short axis, similar to the prior PET-CT. Left axillary lymph nodes have decreased in size, currently measuring up to 14 mm in short axis (previously 29 mm). Mediastinal and hilar lymph nodes have mildly enlarged and measure up to 1 cm in short axis. Lungs/Pleura: No pleural effusion or pneumothorax. Evaluation of the lung parenchyma is mildly limited by respiratory motion artifact. There are patchy areas of ground-glass opacity bilaterally, involving the upper lobes greater than lower lobes. Underlying centrilobular emphysema is again noted. No lung mass. Upper Abdomen: Partially visualized splenomegaly. Musculoskeletal: No suspicious lytic or blastic osseous lesion. Small, scattered Schmorl's nodes in the spine. Review of the MIP images confirms the above findings. IMPRESSION: 1. Motion degraded examination. No proximal segmental or more central pulmonary emboli identified. 2. Axillary and subpectoral lymphadenopathy consistent with history of leukemia. Left axillary lymphadenopathy has improved from 2015. 3. Mild mediastinal and bilateral hilar lymphadenopathy. 4. Ground-glass opacities in both lungs. The differential  diagnosis is broad and includes pneumonia (including viral and opportunistic infection) as well as noninfectious inflammatory causes. 5. Aortic atherosclerosis. Electronically Signed   By: Logan Bores M.D.   On: 10/04/2016 20:36    EKG:   Orders placed or performed during the hospital encounter of 10/04/16  . EKG 12-Lead  . EKG 12-Lead  . ED EKG  . ED EKG  . EKG    ASSESSMENT AND PLAN:   Lisa Figueroa is a 82 y.o. female presenting with Weakness . Admitted 10/04/2016 : Day #: 2 days 1. Acute respiratory failure with hypoxia: Community-acquired pneumonia: Continue supplemental oxygen to keep O2 saturations greater than 92%, continue current antibiotic regimen day #3/5 continue breathing treatments-Okay to transition to oral 2. Essential hypertension: Continue home medications 3. Hypothyroidism unspecified: Synthroid 4. Hyperlipidemia unspecified: Statin therapy  Disposition: Physical therapy recommended home health-likely discharge tomorrow requiring home oxygen  All the records are reviewed and case discussed with Care Management/Social Workerr. Management plans discussed with the patient, family and they are in agreement.  CODE STATUS: full TOTAL TIME TAKING CARE OF THIS PATIENT: 28 minutes.   POSSIBLE D/C IN 1-2DAYS, DEPENDING ON CLINICAL CONDITION.   Alessandra Sawdey,  Karenann Cai.D on 10/06/2016 at 2:04 PM  Between 7am to 6pm - Pager - [phone]  After 6pm: House Pager: - [facility]  Office  [phone]  CC: Primary care physician; Crecencio Mc, MD

## 2016-10-07 ENCOUNTER — Telehealth: Payer: Self-pay | Admitting: Internal Medicine

## 2016-10-07 ENCOUNTER — Telehealth: Payer: Self-pay | Admitting: *Deleted

## 2016-10-07 LAB — CBC
HEMATOCRIT: 39.6 % (ref 35.0–47.0)
HEMOGLOBIN: 13.2 g/dL (ref 12.0–16.0)
MCH: 28.2 pg (ref 26.0–34.0)
MCHC: 33.4 g/dL (ref 32.0–36.0)
MCV: 84.6 fL (ref 80.0–100.0)
Platelets: 205 10*3/uL (ref 150–440)
RBC: 4.68 MIL/uL (ref 3.80–5.20)
RDW: 13.9 % (ref 11.5–14.5)
WBC: 22.4 10*3/uL — ABNORMAL HIGH (ref 3.6–11.0)

## 2016-10-07 LAB — BASIC METABOLIC PANEL
Anion gap: 8 (ref 5–15)
BUN: 10 mg/dL (ref 6–20)
CALCIUM: 8.3 mg/dL — AB (ref 8.9–10.3)
CHLORIDE: 93 mmol/L — AB (ref 101–111)
CO2: 32 mmol/L (ref 22–32)
Creatinine, Ser: 0.7 mg/dL (ref 0.44–1.00)
GFR calc Af Amer: >60 mL/min (ref >60)
Glucose, Bld: 92 mg/dL (ref 65–99)
Potassium: 4.4 mmol/L (ref 3.5–5.1)
Sodium: 133 mmol/L — ABNORMAL LOW (ref 135–145)

## 2016-10-07 MED ORDER — ENSURE ENLIVE PO LIQD: 237.0000 mL | Freq: Two times a day (BID) | ORAL | 12 refills | Status: AC

## 2016-10-07 MED ORDER — MIRTAZAPINE 15 MG PO TABS: 15.0000 mg | ORAL_TABLET | Freq: Every day | ORAL | 0 refills | Status: DC

## 2016-10-07 MED ORDER — SODIUM CHLORIDE 0.9 % IV BOLUS (SEPSIS)
500.0000 mL | Freq: Once | INTRAVENOUS | Status: AC
  Administered 2016-10-07: 09:00:00 500 mL via INTRAVENOUS

## 2016-10-07 MED ORDER — LEVOFLOXACIN 750 MG PO TABS: 750.0000 mg | ORAL_TABLET | Freq: Every day | ORAL | 0 refills | Status: AC

## 2016-10-07 NOTE — Telephone Encounter (Signed)
Pt discharged from [facility] on 10/11, she will need a HFU on a Tuesday or Thursday. Please give a time and date  Contact  [phone]

## 2016-10-07 NOTE — Telephone Encounter (Signed)
Ginger from [facility] called needed to schedule a hospital follow up for the patient for 7-10 days. Pt was in for pneumonia. Please advise, thank you!  Call pt @ [phone]

## 2016-10-07 NOTE — Telephone Encounter (Signed)
Transition Care Management Follow-up Telephone Call  How have you been since you were released from the hospital? Better, took a nap   Do you understand why you were in the hospital? Went to the ER on Sunday was admitted with pneumonia   Do you understand the discharge instrcutions? Yes, to do what she feels like, home health twice a week and PT also  Items Reviewed:  Medications reviewed: two new rx's, Levaquin 750mg  tablets 2 pills ( Was getting this IV in the hospital) and mertipizime 15mg  at hs daily - to increase her appetite  Allergies reviewed: no  Dietary changes reviewed: push drinking ensure drinks Referrals reviewed: [facility] and PT  Functional Questionnaire:   Activities of Daily Living (ADLs):   She states they are independent in the following: independent States they require assistance with the following: none   Any transportation issues/concerns?: no issues   Any patient concerns? Not at this time.    Confirmed importance and date/time of follow-up visits scheduled: 10/19 at 1130am   Confirmed with patient if condition begins to worsen call PCP or go to the ER.  Patient was given the Call-a-Nurse line [phone]: yes

## 2016-10-07 NOTE — Care Management (Signed)
Admitted to [facility] with the diagnosis of pneumonia. Lives alone. Sister is Donzetta Kohut [phone] or [phone]), Daughter is Juliann Pulse [phone]). Last seen Dr. Derrel Nip about a month ago. Skilled Nursing at [facility] 3 years ago. No home oxygen. Uses a cane or wheelchair as needed to support ambulation. Last fall was about a year ago. Appetite is better. Takes care of all basic activities of daily living herself, drives. Prescriptions are filled at [facility]. Daughter will transport. Physical therapy evaluation completed. Recommends home with home health/physical therapy. Chose [facility]. Will update [place] Mall, [facility] representative. Shelbie Ammons RN MSN CCM Care Management

## 2016-10-07 NOTE — Care Management Important Message (Signed)
Important Message  Patient Details  Name: Lisa Figueroa MRN: [id number] Date of Birth: [birth year]   Medicare Important Message Given:  Yes    Shelbie Ammons, RN 10/07/2016, 11:22 AM

## 2016-10-07 NOTE — Discharge Summary (Signed)
[facility] [place] NAME: Lisa Figueroa    MR#:  [id number]  DATE OF BIRTH:  08-09-1932  DATE OF ADMISSION:  10/04/2016 ADMITTING PHYSICIAN: Lance Coon, MD  DATE OF DISCHARGE: 10/07/16  PRIMARY CARE PHYSICIAN: Crecencio Mc, MD    ADMISSION DIAGNOSIS:  Shortness of breath [R06.02] Hypoxia [R09.02] Generalized weakness [R53.1] Community acquired pneumonia, unspecified laterality [J18.9]  DISCHARGE DIAGNOSIS:  Principal Problem: Acute on chronic respiratory failure with hypoxia   CAP (community acquired pneumonia) Active Problems:   Postsurgical hypothyroidism   Essential hypertension   Hyperlipidemia   CLL (chronic lymphocytic leukemia) ([place])   Malnutrition of moderate degree   SECONDARY DIAGNOSIS:   Past Medical History:  Diagnosis Date  . CLL (chronic lymphocytic leukemia) ([place])   . History of colon polyps   . History of shingles   . Hyperlipidemia   . Hypertension   . Post herpetic neuralgia   . Shingles   . Thyroid cancer ([place]) 1998  . Thyroid cancer [facility])     HOSPITAL COURSE:  Lisa Figueroa  is a 82 y.o. female admitted 10/04/2016 with chief complaint Weakness . Please see H&P performed by Lance Coon, MD for further information. Patient presented with the above symptoms including shortness of breath. CT chest concerning for pneumonia. Started on antibiotics and breathing treatment with improvement. Eval by PT who recommended home health  DISCHARGE CONDITIONS:   stable  CONSULTS OBTAINED:    DRUG ALLERGIES:   Allergies  Allergen Reactions  . Penicillin G Swelling  . Prednisone Other (See Comments)    crys and can't sleep crys and can't sleep    DISCHARGE MEDICATIONS:   Current Discharge Medication List    START taking these medications   Details  feeding supplement, ENSURE ENLIVE, (ENSURE ENLIVE) LIQD Take 237 mLs by mouth 2 (two) times daily between meals. Qty: 237 mL, Refills: 12      levofloxacin (LEVAQUIN) 750 MG tablet Take 1 tablet (750 mg total) by mouth daily. Qty: 2 tablet, Refills: 0    mirtazapine (REMERON) 15 MG tablet Take 1 tablet (15 mg total) by mouth at bedtime. Qty: 30 tablet, Refills: 0      CONTINUE these medications which have NOT CHANGED   Details  acetaminophen (TYLENOL) 500 MG tablet Take 500 mg by mouth daily.    amLODipine (NORVASC) 2.5 MG tablet Take 1 tablet (2.5 mg total) by mouth daily. Qty: 90 tablet, Refills: 3    Cholecalciferol (VITAMIN D) 2000 units CAPS Take by mouth daily.    gabapentin (NEURONTIN) 400 MG capsule Take 1 capsule (400 mg total) by mouth 2 (two) times daily. Qty: 270 capsule, Refills: 0    levothyroxine (SYNTHROID, LEVOTHROID) 150 MCG tablet Take 1 tablet (150 mcg total) by mouth daily. Qty: 90 tablet, Refills: 0    losartan (COZAAR) 100 MG tablet Take 1 tablet (100 mg total) by mouth daily. Qty: 90 tablet, Refills: 3         DISCHARGE INSTRUCTIONS:    DIET:  Regular diet  DISCHARGE CONDITION:  Stable  ACTIVITY:  Activity as tolerated  OXYGEN:  Home Oxygen: Yes.     Oxygen Delivery: 2 liters/min via Patient connected to nasal cannula oxygen  DISCHARGE LOCATION:  home   If you experience worsening of your admission symptoms, develop shortness of breath, life threatening emergency, suicidal or homicidal thoughts you must seek medical attention immediately by calling 911 or calling your MD immediately  if symptoms  less severe.  You Must read complete instructions/literature along with all the possible adverse reactions/side effects for all the Medicines you take and that have been prescribed to you. Take any new Medicines after you have completely understood and accpet all the possible adverse reactions/side effects.   Please note  You were cared for by a hospitalist during your hospital stay. If you have any questions about your discharge medications or the care you received while you were in  the hospital after you are discharged, you can call the unit and asked to speak with the hospitalist on call if the hospitalist that took care of you is not available. Once you are discharged, your primary care physician will handle any further medical issues. Please note that NO REFILLS for any discharge medications will be authorized once you are discharged, as it is imperative that you return to your primary care physician (or establish a relationship with a primary care physician if you do not have one) for your aftercare needs so that they can reassess your need for medications and monitor your lab values.    On the day of Discharge:   VITAL SIGNS:  Blood pressure 140/73, pulse 81, temperature 97.9 F (36.6 C), temperature source Oral, resp. rate 20, height 5\' 2"  (1.575 m), weight 71.8 kg (158 lb 6 oz), SpO2 98 %.  I/O:   Intake/Output Summary (Last 24 hours) at 10/07/16 0850 Last data filed at 10/07/16 0554  Gross per 24 hour  Intake              960 ml  Output                0 ml  Net              960 ml    PHYSICAL EXAMINATION:  GENERAL:  82 y.o.-year-old patient lying in the bed with no acute distress.  EYES: Pupils equal, round, reactive to light and accommodation. No scleral icterus. Extraocular muscles intact.  HEENT: Head atraumatic, normocephalic. Oropharynx and nasopharynx clear.  NECK:  Supple, no jugular venous distention. No thyroid enlargement, no tenderness.  LUNGS: Normal breath sounds bilaterally, no wheezing, rales,rhonchi or crepitation. No use of accessory muscles of respiration.  CARDIOVASCULAR: S1, S2 normal. No murmurs, rubs, or gallops.  ABDOMEN: Soft, non-tender, non-distended. Bowel sounds present. No organomegaly or mass.  EXTREMITIES: No pedal edema, cyanosis, or clubbing.  NEUROLOGIC: Cranial nerves II through XII are intact. Muscle strength 5/5 in all extremities. Sensation intact. Gait not checked.  PSYCHIATRIC: The patient is alert and oriented x 3.   SKIN: No obvious rash, lesion, or ulcer.   DATA REVIEW:   CBC  Recent Labs Lab 10/07/16 0748  WBC 22.4*  HGB 13.2  HCT 39.6  PLT 205    Chemistries   Recent Labs Lab 10/06/16 0528  NA 131*  K 4.4  CL 93*  CO2 32  GLUCOSE 90  BUN 7  CREATININE 0.56  CALCIUM 8.5*    Cardiac Enzymes  Recent Labs Lab 10/04/16 1813  TROPONINI <0.03    Microbiology Results  Results for orders placed or performed during the hospital encounter of 10/04/16  Culture, blood (Routine X 2) w Reflex to ID Panel     Status: None (Preliminary result)   Collection Time: 10/04/16  7:17 PM  Result Value Ref Range Status   Specimen Description BLOOD RIGHT ASSIST CONTROL  Final   Special Requests BOTTLES DRAWN AEROBIC AND ANAEROBIC 13CC  Final  Culture NO GROWTH 3 DAYS  Final   Report Status PENDING  Incomplete  Culture, blood (Routine X 2) w Reflex to ID Panel     Status: None (Preliminary result)   Collection Time: 10/04/16  7:17 PM  Result Value Ref Range Status   Specimen Description BLOOD LEFT WRIST  Final   Special Requests   Final    BOTTLES DRAWN AEROBIC AND ANAEROBIC [place]   Culture NO GROWTH 3 DAYS  Final   Report Status PENDING  Incomplete    RADIOLOGY:  No results found.   Management plans discussed with the patient, family and they are in agreement.  CODE STATUS:     Code Status Orders        Start     Ordered   10/04/16 2301  Full code  Continuous     10/04/16 2300    Code Status History    Date Active Date Inactive Code Status Order ID Comments User Context   This patient has a current code status but no historical code status.    Advance Directive Documentation   Flowsheet Row Most Recent Value  Type of Advance Directive  Healthcare Power of Attorney  Pre-existing out of facility DNR order (yellow form or pink MOST form)  No data  "MOST" Form in Place?  No data      TOTAL TIME TAKING CARE OF THIS PATIENT: 33 minutes.    Hower,  Karenann Cai.D on 10/07/2016 at 8:50 AM  Between 7am to 6pm - Pager - [phone]  After 6pm go to [email or web address] - Proofreader  [employer]  [facility]  Office  [phone]  CC: Primary care physician; Crecencio Mc, MD

## 2016-10-07 NOTE — Progress Notes (Signed)
Pt is being discharged today, discharge instructions given to patient and pt's daughter they verified understanding. 0 paper prescriptions given to patient. Instructions given on how to obtain her medications. All belongings packed and returned to patient. She is eating lunch and will be wheeled out In wheelchair by staff.

## 2016-10-07 NOTE — Telephone Encounter (Signed)
Duplicated message.

## 2016-10-08 ENCOUNTER — Telehealth: Payer: Self-pay

## 2016-10-08 DIAGNOSIS — E785 Hyperlipidemia, unspecified: Secondary | ICD-10-CM | POA: Diagnosis not present

## 2016-10-08 DIAGNOSIS — Z85850 Personal history of malignant neoplasm of thyroid: Secondary | ICD-10-CM | POA: Diagnosis not present

## 2016-10-08 DIAGNOSIS — E46 Unspecified protein-calorie malnutrition: Secondary | ICD-10-CM | POA: Diagnosis not present

## 2016-10-08 DIAGNOSIS — J189 Pneumonia, unspecified organism: Secondary | ICD-10-CM | POA: Diagnosis not present

## 2016-10-08 DIAGNOSIS — E039 Hypothyroidism, unspecified: Secondary | ICD-10-CM | POA: Diagnosis not present

## 2016-10-08 DIAGNOSIS — C9110 Chronic lymphocytic leukemia of B-cell type not having achieved remission: Secondary | ICD-10-CM | POA: Diagnosis not present

## 2016-10-08 DIAGNOSIS — I10 Essential (primary) hypertension: Secondary | ICD-10-CM | POA: Diagnosis not present

## 2016-10-08 DIAGNOSIS — R531 Weakness: Secondary | ICD-10-CM | POA: Diagnosis not present

## 2016-10-08 NOTE — Telephone Encounter (Signed)
Home health called to give update on patient, no breathing treatments ordered post discharged, she came today to do first visit and her Oz sats at rest were 88% on room air, increased to 93% with deep breathing exercises.  I explained that patient has not seen PCP yet and has upcomming appt next week, she was going to call the hospital for orders.

## 2016-10-08 NOTE — Telephone Encounter (Signed)
Pt does not have a ride to appt. On Oct 19th. She can come  On the 18th or 20th for a hospital Follow up

## 2016-10-08 NOTE — Telephone Encounter (Signed)
Spoke with the patient and moved her appt to the 18th at 1130am.

## 2016-10-09 ENCOUNTER — Telehealth: Payer: Self-pay | Admitting: *Deleted

## 2016-10-09 DIAGNOSIS — E785 Hyperlipidemia, unspecified: Secondary | ICD-10-CM | POA: Diagnosis not present

## 2016-10-09 DIAGNOSIS — R531 Weakness: Secondary | ICD-10-CM | POA: Diagnosis not present

## 2016-10-09 DIAGNOSIS — J189 Pneumonia, unspecified organism: Secondary | ICD-10-CM | POA: Diagnosis not present

## 2016-10-09 DIAGNOSIS — E46 Unspecified protein-calorie malnutrition: Secondary | ICD-10-CM | POA: Diagnosis not present

## 2016-10-09 DIAGNOSIS — C9110 Chronic lymphocytic leukemia of B-cell type not having achieved remission: Secondary | ICD-10-CM | POA: Diagnosis not present

## 2016-10-09 DIAGNOSIS — I10 Essential (primary) hypertension: Secondary | ICD-10-CM | POA: Diagnosis not present

## 2016-10-09 LAB — CULTURE, BLOOD (ROUTINE X 2)
Culture: NO GROWTH
Culture: NO GROWTH

## 2016-10-09 NOTE — Telephone Encounter (Signed)
You can give verbal order.  I cannot do the order in EPIC because I have not seen patient yet

## 2016-10-09 NOTE — Telephone Encounter (Signed)
Patient has Hospital follow up with you next week, please advise for PT orders, thanks

## 2016-10-09 NOTE — Telephone Encounter (Signed)
Scott from [facility] has requested physical therapy 2 times a week for 8 weeks. Contact [phone]

## 2016-10-12 DIAGNOSIS — J189 Pneumonia, unspecified organism: Secondary | ICD-10-CM | POA: Diagnosis not present

## 2016-10-12 DIAGNOSIS — C9110 Chronic lymphocytic leukemia of B-cell type not having achieved remission: Secondary | ICD-10-CM | POA: Diagnosis not present

## 2016-10-12 DIAGNOSIS — E46 Unspecified protein-calorie malnutrition: Secondary | ICD-10-CM | POA: Diagnosis not present

## 2016-10-12 DIAGNOSIS — E785 Hyperlipidemia, unspecified: Secondary | ICD-10-CM | POA: Diagnosis not present

## 2016-10-12 DIAGNOSIS — I10 Essential (primary) hypertension: Secondary | ICD-10-CM | POA: Diagnosis not present

## 2016-10-12 DIAGNOSIS — R531 Weakness: Secondary | ICD-10-CM | POA: Diagnosis not present

## 2016-10-13 DIAGNOSIS — R531 Weakness: Secondary | ICD-10-CM | POA: Diagnosis not present

## 2016-10-13 DIAGNOSIS — J189 Pneumonia, unspecified organism: Secondary | ICD-10-CM | POA: Diagnosis not present

## 2016-10-13 DIAGNOSIS — E46 Unspecified protein-calorie malnutrition: Secondary | ICD-10-CM | POA: Diagnosis not present

## 2016-10-13 DIAGNOSIS — I10 Essential (primary) hypertension: Secondary | ICD-10-CM | POA: Diagnosis not present

## 2016-10-13 DIAGNOSIS — C9110 Chronic lymphocytic leukemia of B-cell type not having achieved remission: Secondary | ICD-10-CM | POA: Diagnosis not present

## 2016-10-13 DIAGNOSIS — E785 Hyperlipidemia, unspecified: Secondary | ICD-10-CM | POA: Diagnosis not present

## 2016-10-13 NOTE — Telephone Encounter (Signed)
Lisa Figueroa was called and given the verbal order.

## 2016-10-14 ENCOUNTER — Ambulatory Visit (INDEPENDENT_AMBULATORY_CARE_PROVIDER_SITE_OTHER): Payer: Medicare Other

## 2016-10-14 ENCOUNTER — Ambulatory Visit (INDEPENDENT_AMBULATORY_CARE_PROVIDER_SITE_OTHER): Payer: Medicare Other | Admitting: Internal Medicine

## 2016-10-14 ENCOUNTER — Telehealth: Payer: Self-pay

## 2016-10-14 ENCOUNTER — Encounter: Payer: Self-pay | Admitting: Internal Medicine

## 2016-10-14 VITALS — BP 118/76 | HR 81 | Temp 97.5°F | Resp 10 | Ht 62.0 in | Wt 151.8 lb

## 2016-10-14 DIAGNOSIS — J189 Pneumonia, unspecified organism: Secondary | ICD-10-CM | POA: Diagnosis not present

## 2016-10-14 DIAGNOSIS — R110 Nausea: Secondary | ICD-10-CM

## 2016-10-14 DIAGNOSIS — C9110 Chronic lymphocytic leukemia of B-cell type not having achieved remission: Secondary | ICD-10-CM | POA: Diagnosis not present

## 2016-10-14 DIAGNOSIS — E871 Hypo-osmolality and hyponatremia: Secondary | ICD-10-CM | POA: Diagnosis not present

## 2016-10-14 DIAGNOSIS — Z09 Encounter for follow-up examination after completed treatment for conditions other than malignant neoplasm: Secondary | ICD-10-CM

## 2016-10-14 DIAGNOSIS — Z9981 Dependence on supplemental oxygen: Secondary | ICD-10-CM

## 2016-10-14 DIAGNOSIS — J9611 Chronic respiratory failure with hypoxia: Secondary | ICD-10-CM | POA: Diagnosis not present

## 2016-10-14 DIAGNOSIS — E782 Mixed hyperlipidemia: Secondary | ICD-10-CM

## 2016-10-14 LAB — CBC WITH DIFFERENTIAL/PLATELET
BASOS ABS: 0.1 10*3/uL (ref 0.0–0.1)
Basophils Relative: 0.3 % (ref 0.0–3.0)
EOS ABS: 0.4 10*3/uL (ref 0.0–0.7)
Eosinophils Relative: 1.2 % (ref 0.0–5.0)
HCT: 44.5 % (ref 36.0–46.0)
HEMOGLOBIN: 14.5 g/dL (ref 12.0–15.0)
LYMPHS ABS: 22.5 10*3/uL — AB (ref 0.7–4.0)
Lymphocytes Relative: 75.7 % — ABNORMAL HIGH (ref 12.0–46.0)
MCHC: 32.5 g/dL (ref 30.0–36.0)
MCV: 84.9 fl (ref 78.0–100.0)
MONO ABS: 0.4 10*3/uL (ref 0.1–1.0)
MONOS PCT: 1.4 % — AB (ref 3.0–12.0)
NEUTROS PCT: 21.4 % — AB (ref 43.0–77.0)
Neutro Abs: 6.3 10*3/uL (ref 1.4–7.7)
PLATELETS: 300 10*3/uL (ref 150.0–400.0)
RBC: 5.24 Mil/uL — ABNORMAL HIGH (ref 3.87–5.11)
RDW: 14.1 % (ref 11.5–15.5)
WBC: 29.7 10*3/uL (ref 4.0–10.5)

## 2016-10-14 LAB — COMPREHENSIVE METABOLIC PANEL
ALT: 10 U/L (ref 0–35)
AST: 16 U/L (ref 0–37)
Albumin: 4 g/dL (ref 3.5–5.2)
Alkaline Phosphatase: 53 U/L (ref 39–117)
BUN: 13 mg/dL (ref 6–23)
CHLORIDE: 90 meq/L — AB (ref 96–112)
CO2: 34 meq/L — AB (ref 19–32)
CREATININE: 0.66 mg/dL (ref 0.40–1.20)
Calcium: 9.8 mg/dL (ref 8.4–10.5)
GFR: 91.11 mL/min (ref >60.00)
GLUCOSE: 79 mg/dL (ref 70–99)
Potassium: 4.4 mEq/L (ref 3.5–5.1)
SODIUM: 133 meq/L — AB (ref 135–145)
Total Bilirubin: 0.6 mg/dL (ref 0.2–1.2)
Total Protein: 6.9 g/dL (ref 6.0–8.3)

## 2016-10-14 NOTE — Progress Notes (Signed)
Subjective:  Patient ID: Lisa Figueroa, female    DOB: July 28, 1932  Age: 82 y.o. MRN: [id number]  CC: The primary encounter diagnosis was Community acquired pneumonia, unspecified laterality. Diagnoses of Nausea without vomiting, Hyponatremia, CLL (chronic lymphocytic leukemia) ([place]), Chronic respiratory failure with hypoxia, on home O2 therapy ([place]), Mixed hyperlipidemia, and Hospital discharge follow-up were also pertinent to this visit.  HPIhospi Lisa Figueroa presents for hospital follow up . Patient was admitted to [facility] on October 8 with acute hypoxic respiratory failure (O2 sats 79%) attributed tp community acquired pneumonia   She was treated with empiric antibiotics and supplemental oxygen and discharged on October 11th on 2L supplemental O2 to use "as needed."    CT of chest reviewed with patient.  Ground glass opacities bilaterally,  LAD and atherosclerosis.   Patient arrived  today  without oxygen,  Room air sats 86%.  Has only been wearing 02 at night  bc her home readings have been > 90% at rest using home pulse ox meter/  Sleeps in a recliner .  Using home pulse ox to guide use of oxygen.  Notes a drop while showering.  Lisa Figueroa has been supervising showers,  Taking her to the grocery store and  bringing her food. Lisa Figueroa    Appetite has been  poor.  Drinking an ensure daily.     Labs reviewed  Mild hyponatremia 131 , 133 at d/c  Leukocytosis 30K at admission,  22.4K at d/c(has CLL)  Blood cultures negative x 2 ion chest  CT angio of chest: bilateral upper lobe GG opacities,  Atherosclerosis and CAD noted   Discharged with Levaquin750  x 2 days,  remeron 15 mg qhs  Has been referred for home health and PT twice weekly     Outpatient Medications Prior to Visit  Medication Sig Dispense Refill  . acetaminophen (TYLENOL) 500 MG tablet Take 500 mg by mouth daily.    Lisa Figueroa amLODipine (NORVASC) 2.5 MG tablet Take 1 tablet (2.5 mg total) by mouth daily. 90 tablet 3  .  Cholecalciferol (VITAMIN D) 2000 units CAPS Take by mouth daily.    . feeding supplement, ENSURE ENLIVE, (ENSURE ENLIVE) LIQD Take 237 mLs by mouth 2 (two) times daily between meals. 237 mL 12  . gabapentin (NEURONTIN) 400 MG capsule Take 1 capsule (400 mg total) by mouth 2 (two) times daily. 270 capsule 0  . levothyroxine (SYNTHROID, LEVOTHROID) 150 MCG tablet Take 1 tablet (150 mcg total) by mouth daily. 90 tablet 0  . losartan (COZAAR) 100 MG tablet Take 1 tablet (100 mg total) by mouth daily. 90 tablet 3  . mirtazapine (REMERON) 15 MG tablet Take 1 tablet (15 mg total) by mouth at bedtime. 30 tablet 0   No facility-administered medications prior to visit.     Review of Systems;  Patient denies headache, fevers, malaise, unintentional weight loss, skin rash, eye pain, sinus congestion and sinus pain, sore throat, dysphagia,  hemoptysis , cough, dyspnea, wheezing, chest pain, palpitations, orthopnea, edema, abdominal pain, nausea, melena, diarrhea, constipation, flank pain, dysuria, hematuria, urinary  Frequency, nocturia, numbness, tingling, seizures,  Focal weakness, Loss of consciousness,  Tremor, insomnia, depression, anxiety, and suicidal ideation.      Objective:  BP 118/76   Pulse 81   Temp 97.5 F (36.4 C) (Oral)   Resp 10   Ht 5\' 2"  (1.575 m)   Wt 151 lb 12 oz (68.8 kg)   SpO2 (!) 84%   BMI 27.76 kg/m  BP Readings from Last 3 Encounters:  10/14/16 118/76  10/07/16 (!) 82/46  09/02/16 (!) 150/100    Wt Readings from Last 3 Encounters:  10/14/16 151 lb 12 oz (68.8 kg)  10/04/16 158 lb 6 oz (71.8 kg)  09/02/16 162 lb (73.5 kg)    General appearance: alert, cooperative and appears stated age Ears: normal TM's and external ear canals both ears Throat: lips, mucosa, and tongue normal; teeth and gums normal Neck: no adenopathy, no carotid bruit, supple, symmetrical, trachea midline and thyroid not enlarged, symmetric, no tenderness/mass/nodules Back: symmetric, no  curvature. ROM normal. No CVA tenderness. Lungs: clear to auscultation bilaterally Heart: regular rate and rhythm, S1, S2 normal, no murmur, click, rub or gallop Abdomen: soft, non-tender; bowel sounds normal; no masses,  no organomegaly Pulses: 2+ and symmetric Skin: Skin color, texture, turgor normal. No rashes or lesions Lymph nodes: Cervical, supraclavicular, and axillary nodes normal.  Lab Results  Component Value Date   HGBA1C 5.2 05/14/2015    Lab Results  Component Value Date   CREATININE 0.66 10/14/2016   CREATININE 0.70 10/07/2016   CREATININE 0.56 10/06/2016    Lab Results  Component Value Date   WBC 29.7 Repeated and verified X2. (HH) 10/14/2016   HGB 14.5 10/14/2016   HCT 44.5 10/14/2016   PLT 300.0 10/14/2016   GLUCOSE 79 10/14/2016   CHOL 163 01/31/2016   TRIG 81.0 01/31/2016   HDL 50.00 01/31/2016   LDLCALC 97 01/31/2016   ALT 10 10/14/2016   AST 16 10/14/2016   NA 133 (L) 10/14/2016   K 4.4 10/14/2016   CL 90 (L) 10/14/2016   CREATININE 0.66 10/14/2016   BUN 13 10/14/2016   CO2 34 (H) 10/14/2016   TSH 0.565 10/04/2016   INR 1.0 06/27/2013   HGBA1C 5.2 05/14/2015   MICROALBUR 1.2 01/31/2016    Ct Angio Chest Pe W Or Wo Contrast  Result Date: 10/04/2016 CLINICAL DATA:  Worsening shortness of breath and weakness. History of chronic lymphocytic leukemia and thyroid cancer. EXAM: CT ANGIOGRAPHY CHEST WITH CONTRAST TECHNIQUE: Multidetector CT imaging of the chest was performed using the standard protocol during bolus administration of intravenous contrast. Multiplanar CT image reconstructions and MIPs were obtained to evaluate the vascular anatomy. CONTRAST:  75 mL Isovue 370 COMPARISON:  Chest radiographs 05/22/2016.  PET-CT 10/03/2014. FINDINGS: Cardiovascular: Pulmonary arterial opacification is adequate, however respiratory motion artifact limits assessment of the subsegmental and some segmental branches, particularly in the lower lobes. No definite  pulmonary emboli are identified within this limitation. Mild ascending aortic ectasia has not significantly changed from the prior PET-CT, measuring 4.3 cm (previously 4.2 cm). There is extensive thoracic aortic atherosclerosis. Coronary artery atherosclerosis is also noted. No pericardial effusion. Mediastinum/Nodes: Subpectoral and axillary lymphadenopathy is again seen. Right axillary lymph nodes measure up to 11 mm in short axis, similar to the prior PET-CT. Left axillary lymph nodes have decreased in size, currently measuring up to 14 mm in short axis (previously 29 mm). Mediastinal and hilar lymph nodes have mildly enlarged and measure up to 1 cm in short axis. Lungs/Pleura: No pleural effusion or pneumothorax. Evaluation of the lung parenchyma is mildly limited by respiratory motion artifact. There are patchy areas of ground-glass opacity bilaterally, involving the upper lobes greater than lower lobes. Underlying centrilobular emphysema is again noted. No lung mass. Upper Abdomen: Partially visualized splenomegaly. Musculoskeletal: No suspicious lytic or blastic osseous lesion. Small, scattered Schmorl's nodes in the spine. Review of the MIP images confirms the above  findings. IMPRESSION: 1. Motion degraded examination. No proximal segmental or more central pulmonary emboli identified. 2. Axillary and subpectoral lymphadenopathy consistent with history of leukemia. Left axillary lymphadenopathy has improved from 2015. 3. Mild mediastinal and bilateral hilar lymphadenopathy. 4. Ground-glass opacities in both lungs. The differential diagnosis is broad and includes pneumonia (including viral and opportunistic infection) as well as noninfectious inflammatory causes. 5. Aortic atherosclerosis. Electronically Signed   By: Logan Bores M.D.   On: 10/04/2016 20:36    Assessment & Plan:   Problem List Items Addressed This Visit    Hyperlipidemia    Given the finding of atherosclerosis on CT scan will repeat  lipids in one month and recommend statin therapy. Prior LDL was < 100   Lab Results  Component Value Date   CHOL 163 01/31/2016   HDL 50.00 01/31/2016   LDLCALC 97 01/31/2016   TRIG 81.0 01/31/2016   CHOLHDL 3 01/31/2016         CLL (chronic lymphocytic leukemia) ([place])    She has a chronic leukocytosis   Lab Results  Component Value Date   WBC 29.7 Repeated and verified X2. ([place]) 10/14/2016   HGB 14.5 10/14/2016   HCT 44.5 10/14/2016   MCV 84.9 10/14/2016   PLT 300.0 10/14/2016         Hyponatremia    improving steadily /stable   , likely due to  stopping HCTZ.  Mild  asymptomatic  Lab Results  Component Value Date   NA 133 (L) 10/14/2016   K 4.4 10/14/2016   CL 90 (L) 10/14/2016   CO2 34 (H) 10/14/2016         Relevant Orders   Comprehensive metabolic panel (Completed)   CAP (community acquired pneumonia) - Primary    S/p empiric treatment with levaquin.  Repeat chest x ray today given persistent hypoxia shows chronic interstitial changes,  No superimposed infiltrate or effusion . Use of probiotic advised by me.       Relevant Orders   DG Chest 2 View (Completed)   CBC with Differential/Platelet (Completed)   Chronic respiratory failure with hypoxia, on home O2 therapy [facility])    May be chronic given improvement in infiltrate by repeat chest x ray. . Patient advised to wear 02 continuously based on readings obtained here today.  Return in two weeks  month for reassessment.       Hospital discharge follow-up    All labs , imaging studies and progress notes from admission were reviewed with patient today . Patient is stable post discharge and has no new issues or questions about discharge plans at the visit today for hospital follow up.  I have reviewed the records from the hospital admission in detail with patient today.       Other Visit Diagnoses    Nausea without vomiting          I am having Ms. Michelson maintain her acetaminophen, losartan, gabapentin,  levothyroxine, Vitamin D, amLODipine, feeding supplement (ENSURE ENLIVE), and mirtazapine.  No orders of the defined types were placed in this encounter.   There are no discontinued medications.  Follow-up: No Follow-up on file.   Crecencio Mc, MD

## 2016-10-14 NOTE — Telephone Encounter (Signed)
MD aware,  Patient has CLL.  This is normal for her.

## 2016-10-14 NOTE — Patient Instructions (Addendum)
Your pulse ox meter is overestimating your oxygen level.  You should wear it all the time until you see me again in 2 weeks   Please take a probiotic ( Align, Floraque or Culturelle) or the generic (store brand) version of one of these  For a minimum of 3 weeks to prevent a serious antibiotic associated diarrhea  Called clostridium dificile colitis .  This happens after being put on antibiotics for infection because the antibiotics kill off all the "good gut bacteria"  But not the bad one ("C dif")    The probiotic will replace all the good bacteria.   WE will repeat your labs and chest x ray today

## 2016-10-14 NOTE — Telephone Encounter (Signed)
CRITICAL RESULT  WBC 29.7 75% lymophocytes (Likely viral)  Called from the [facility] lab.  Please advise.

## 2016-10-14 NOTE — Progress Notes (Signed)
Pre-visit discussion using our clinic review tool. No additional management support is needed unless otherwise documented below in the visit note.  

## 2016-10-14 NOTE — Telephone Encounter (Signed)
Noted, thanks!

## 2016-10-15 ENCOUNTER — Ambulatory Visit: Payer: Medicare Other | Admitting: Internal Medicine

## 2016-10-15 DIAGNOSIS — C9110 Chronic lymphocytic leukemia of B-cell type not having achieved remission: Secondary | ICD-10-CM | POA: Diagnosis not present

## 2016-10-15 DIAGNOSIS — J189 Pneumonia, unspecified organism: Secondary | ICD-10-CM | POA: Diagnosis not present

## 2016-10-15 DIAGNOSIS — E785 Hyperlipidemia, unspecified: Secondary | ICD-10-CM | POA: Diagnosis not present

## 2016-10-15 DIAGNOSIS — I10 Essential (primary) hypertension: Secondary | ICD-10-CM | POA: Diagnosis not present

## 2016-10-15 DIAGNOSIS — R531 Weakness: Secondary | ICD-10-CM | POA: Diagnosis not present

## 2016-10-15 DIAGNOSIS — E46 Unspecified protein-calorie malnutrition: Secondary | ICD-10-CM | POA: Diagnosis not present

## 2016-10-16 DIAGNOSIS — Z09 Encounter for follow-up examination after completed treatment for conditions other than malignant neoplasm: Secondary | ICD-10-CM | POA: Insufficient documentation

## 2016-10-16 DIAGNOSIS — J9611 Chronic respiratory failure with hypoxia: Secondary | ICD-10-CM | POA: Insufficient documentation

## 2016-10-16 DIAGNOSIS — Z9981 Dependence on supplemental oxygen: Secondary | ICD-10-CM

## 2016-10-16 NOTE — Assessment & Plan Note (Signed)
All labs , imaging studies and progress notes from admission were reviewed with patient today . Patient is stable post discharge and has no new issues or questions about discharge plans at the visit today for hospital follow up.  I have reviewed the records from the hospital admission in detail with patient today.

## 2016-10-16 NOTE — Assessment & Plan Note (Signed)
improving steadily /stable   , likely due to  stopping HCTZ.  Mild  asymptomatic  Lab Results  Component Value Date   NA 133 (L) 10/14/2016   K 4.4 10/14/2016   CL 90 (L) 10/14/2016   CO2 34 (H) 10/14/2016

## 2016-10-16 NOTE — Assessment & Plan Note (Addendum)
May be chronic given improvement in infiltrate by repeat chest x ray. . Patient advised to wear 02 continuously based on readings obtained here today.  Return in two weeks  month for reassessment.

## 2016-10-16 NOTE — Assessment & Plan Note (Addendum)
Given the finding of atherosclerosis on CT scan will repeat lipids in one month and recommend statin therapy. Prior LDL was < 100   Lab Results  Component Value Date   CHOL 163 01/31/2016   HDL 50.00 01/31/2016   LDLCALC 97 01/31/2016   TRIG 81.0 01/31/2016   CHOLHDL 3 01/31/2016

## 2016-10-16 NOTE — Assessment & Plan Note (Signed)
She has a chronic leukocytosis   Lab Results  Component Value Date   WBC 29.7 Repeated and verified X2. (HH) 10/14/2016   HGB 14.5 10/14/2016   HCT 44.5 10/14/2016   MCV 84.9 10/14/2016   PLT 300.0 10/14/2016

## 2016-10-16 NOTE — Assessment & Plan Note (Addendum)
S/p empiric treatment with levaquin.  Repeat chest x ray today given persistent hypoxia shows chronic interstitial changes,  No superimposed infiltrate or effusion . Use of probiotic advised by me.

## 2016-10-20 DIAGNOSIS — J189 Pneumonia, unspecified organism: Secondary | ICD-10-CM | POA: Diagnosis not present

## 2016-10-20 DIAGNOSIS — R531 Weakness: Secondary | ICD-10-CM | POA: Diagnosis not present

## 2016-10-20 DIAGNOSIS — E785 Hyperlipidemia, unspecified: Secondary | ICD-10-CM | POA: Diagnosis not present

## 2016-10-20 DIAGNOSIS — C9110 Chronic lymphocytic leukemia of B-cell type not having achieved remission: Secondary | ICD-10-CM | POA: Diagnosis not present

## 2016-10-20 DIAGNOSIS — E46 Unspecified protein-calorie malnutrition: Secondary | ICD-10-CM | POA: Diagnosis not present

## 2016-10-20 DIAGNOSIS — I10 Essential (primary) hypertension: Secondary | ICD-10-CM | POA: Diagnosis not present

## 2016-10-21 ENCOUNTER — Telehealth: Payer: Self-pay | Admitting: Internal Medicine

## 2016-10-21 NOTE — Telephone Encounter (Signed)
Pt called and stated that the [place] Nurse said that it still sounds like she still has a little pneumonia, pt states that she was only given 2 tablets of Levofloxican to take home and has none left. Please advise, thank you!  Call pt @ [phone]

## 2016-10-21 NOTE — Telephone Encounter (Signed)
Pt has requested a call today [phone]

## 2016-10-21 NOTE — Telephone Encounter (Signed)
Notified patient that she is seeing MD next week and that if MD thought she needed more ABX she would have prescribed during visit notified patient ABX continue to work after they are completed.  Patient ask id she could have small glass of wine. MD ok'd

## 2016-10-22 DIAGNOSIS — E785 Hyperlipidemia, unspecified: Secondary | ICD-10-CM | POA: Diagnosis not present

## 2016-10-22 DIAGNOSIS — R531 Weakness: Secondary | ICD-10-CM | POA: Diagnosis not present

## 2016-10-22 DIAGNOSIS — I10 Essential (primary) hypertension: Secondary | ICD-10-CM | POA: Diagnosis not present

## 2016-10-22 DIAGNOSIS — E46 Unspecified protein-calorie malnutrition: Secondary | ICD-10-CM | POA: Diagnosis not present

## 2016-10-22 DIAGNOSIS — C9110 Chronic lymphocytic leukemia of B-cell type not having achieved remission: Secondary | ICD-10-CM | POA: Diagnosis not present

## 2016-10-22 DIAGNOSIS — J189 Pneumonia, unspecified organism: Secondary | ICD-10-CM | POA: Diagnosis not present

## 2016-10-27 DIAGNOSIS — R531 Weakness: Secondary | ICD-10-CM | POA: Diagnosis not present

## 2016-10-27 DIAGNOSIS — E46 Unspecified protein-calorie malnutrition: Secondary | ICD-10-CM | POA: Diagnosis not present

## 2016-10-27 DIAGNOSIS — E785 Hyperlipidemia, unspecified: Secondary | ICD-10-CM | POA: Diagnosis not present

## 2016-10-27 DIAGNOSIS — I10 Essential (primary) hypertension: Secondary | ICD-10-CM | POA: Diagnosis not present

## 2016-10-27 DIAGNOSIS — C9110 Chronic lymphocytic leukemia of B-cell type not having achieved remission: Secondary | ICD-10-CM | POA: Diagnosis not present

## 2016-10-27 DIAGNOSIS — J189 Pneumonia, unspecified organism: Secondary | ICD-10-CM | POA: Diagnosis not present

## 2016-10-28 ENCOUNTER — Encounter: Payer: Self-pay | Admitting: Internal Medicine

## 2016-10-28 ENCOUNTER — Ambulatory Visit (INDEPENDENT_AMBULATORY_CARE_PROVIDER_SITE_OTHER): Payer: Medicare Other | Admitting: Internal Medicine

## 2016-10-28 VITALS — BP 128/72 | HR 75 | Temp 97.5°F | Resp 12 | Ht 62.0 in | Wt 150.0 lb

## 2016-10-28 DIAGNOSIS — E871 Hypo-osmolality and hyponatremia: Secondary | ICD-10-CM | POA: Diagnosis not present

## 2016-10-28 DIAGNOSIS — J9611 Chronic respiratory failure with hypoxia: Secondary | ICD-10-CM | POA: Diagnosis not present

## 2016-10-28 DIAGNOSIS — E890 Postprocedural hypothyroidism: Secondary | ICD-10-CM | POA: Diagnosis not present

## 2016-10-28 DIAGNOSIS — Z9981 Dependence on supplemental oxygen: Secondary | ICD-10-CM

## 2016-10-28 DIAGNOSIS — C9110 Chronic lymphocytic leukemia of B-cell type not having achieved remission: Secondary | ICD-10-CM | POA: Diagnosis not present

## 2016-10-28 NOTE — Patient Instructions (Addendum)
You still need to wear your oxygen all the time  I am concerned that you have emphysema AND pulmonary fibrosis [place]  I have ordered Pulmonary function tests and a follow up with Dr Alva Garnet.  He is  A LUNG DOCTOR .

## 2016-10-28 NOTE — Progress Notes (Signed)
Pre-visit discussion using our clinic review tool. No additional management support is needed unless otherwise documented below in the visit note.  

## 2016-10-28 NOTE — Progress Notes (Signed)
Subjective:  Patient ID: Lisa Figueroa, female    DOB: 09/30/1932  Age: 82 y.o. MRN: [id number]  CC: The primary encounter diagnosis was Chronic respiratory failure with hypoxia ([place]). Diagnoses of Chronic respiratory failure with hypoxia, on home O2 therapy (HCC), CLL (chronic lymphocytic leukemia) ([place]), Postsurgical hypothyroidism, and Hyponatremia were also pertinent to this visit.  HPI Lisa Figueroa presents for follow up on hypoxic respiratory failure secondary to  community acquired pneumonia.  She was recently admitted with acute hypoxic respiratory failure. After discharge she was seen for hospital follow up  On October 18th wearing supplemental oxygen.  CT done during hospitalization noted centrilobular emphysema  And Ground glass opacities.  Repeat chest films on Oct 18h showed stable interstitial changes    She feels somewhat better but continues to require supplemental oxygen,  however she  has not been using her supplemental 02 as directed and arrived today without 02,  sats of 85% Has demonstrated need for 02 portable tank.  Paper work iin process.    Diagnosis of emphysema discussed , as she has no prior diagnosis of COPD or treatment for bronchitis.  Occupational/envirnomental exposures discussed.  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 Smoked cigarettes daily from  age of 62 to age 89 .  Cough has resolved for the most part.  She notes that she has some cough with eating unless needs she tucks her chin.     Outpatient Medications Prior to Visit  Medication Sig Dispense Refill  . acetaminophen (TYLENOL) 500 MG tablet Take 500 mg by mouth daily.    Marland Kitchen amLODipine (NORVASC) 2.5 MG tablet Take 1 tablet (2.5 mg total) by mouth daily. 90 tablet 3  . Cholecalciferol (VITAMIN D) 2000 units CAPS Take by mouth daily.    . feeding supplement, ENSURE ENLIVE, (ENSURE ENLIVE) LIQD Take 237 mLs by mouth 2 (two)  times daily between meals. 237 mL 12  . gabapentin (NEURONTIN) 400 MG capsule Take 1 capsule (400 mg total) by mouth 2 (two) times daily. 270 capsule 0  . levothyroxine (SYNTHROID, LEVOTHROID) 150 MCG tablet Take 1 tablet (150 mcg total) by mouth daily. 90 tablet 0  . losartan (COZAAR) 100 MG tablet Take 1 tablet (100 mg total) by mouth daily. 90 tablet 3  . mirtazapine (REMERON) 15 MG tablet Take 1 tablet (15 mg total) by mouth at bedtime. 30 tablet 0   No facility-administered medications prior to visit.     Review of Systems;  Patient denies headache, fevers, malaise, unintentional weight loss, skin rash, eye pain, sinus congestion and sinus pain, sore throat, dysphagia,  hemoptysis , cough, dyspnea, wheezing, chest pain, palpitations, orthopnea, edema, abdominal pain, nausea, melena, diarrhea, constipation, flank pain, dysuria, hematuria, urinary  Frequency, nocturia, numbness, tingling, seizures,  Focal weakness, Loss of consciousness,  Tremor, insomnia, depression, anxiety, and suicidal ideation.      Objective:  BP 128/72   Pulse 75   Temp 97.5 F (36.4 C) (Oral)   Resp 12   Ht 5\' 2"  (1.575 m)   Wt 150 lb (68 kg)   SpO2 (!) 85% Comment: Does not have 02 on as ordered  BMI 27.44 kg/m   BP Readings from Last 3 Encounters:  10/28/16 128/72  10/14/16 118/76  10/07/16 (!) 82/46    Wt Readings from Last 3 Encounters:  10/28/16 150 lb (68 kg)  10/14/16 151 lb 12 oz (68.8 kg)  10/04/16 158 lb 6 oz (71.8 kg)    General appearance: alert, cooperative and appears stated age Neck: no adenopathy, no carotid bruit, supple, symmetrical, trachea midline and thyroid not enlarged, symmetric, no tenderness/mass/nodules Back: symmetric, no curvature. ROM normal. No CVA tenderness. Lungs: decreased air movement bilaterally,  With crackles throughout all lung fields.   Heart: regular rate and rhythm, S1, S2 normal, no murmur, click, rub or gallop Abdomen: soft, non-tender; bowel sounds  normal; no masses,  no organomegaly Pulses: 2+ and symmetric Skin: Skin color, texture, turgor normal. No rashes or lesions Lymph nodes: Cervical, supraclavicular, and axillary nodes normal.  Lab Results  Component Value Date   HGBA1C 5.2 05/14/2015    Lab Results  Component Value Date   CREATININE 0.66 10/14/2016   CREATININE 0.70 10/07/2016   CREATININE 0.56 10/06/2016    Lab Results  Component Value Date   WBC 29.7 Repeated and verified X2. (HH) 10/14/2016   HGB 14.5 10/14/2016   HCT 44.5 10/14/2016   PLT 300.0 10/14/2016   GLUCOSE 79 10/14/2016   CHOL 163 01/31/2016   TRIG 81.0 01/31/2016   HDL 50.00 01/31/2016   LDLCALC 97 01/31/2016   ALT 10 10/14/2016   AST 16 10/14/2016   NA 133 (L) 10/14/2016   K 4.4 10/14/2016   CL 90 (L) 10/14/2016   CREATININE 0.66 10/14/2016   BUN 13 10/14/2016   CO2 34 (H) 10/14/2016   TSH 0.565 10/04/2016   INR 1.0 06/27/2013   HGBA1C 5.2 05/14/2015   MICROALBUR 1.2 01/31/2016    Ct Angio Chest Pe W Or Wo Contrast  Result Date: 10/04/2016 CLINICAL DATA:  Worsening shortness of breath and weakness. History of chronic lymphocytic leukemia and thyroid cancer. EXAM: CT ANGIOGRAPHY CHEST WITH CONTRAST TECHNIQUE: Multidetector CT imaging of the chest was performed using the standard protocol during bolus administration of intravenous contrast. Multiplanar CT image reconstructions and MIPs were obtained to evaluate the vascular anatomy. CONTRAST:  75 mL Isovue 370 COMPARISON:  Chest radiographs 05/22/2016.  PET-CT 10/03/2014. FINDINGS: Cardiovascular: Pulmonary arterial opacification is adequate, however respiratory motion artifact limits assessment of the subsegmental and some segmental branches, particularly in the lower lobes. No definite pulmonary emboli are identified within this limitation. Mild ascending aortic ectasia has not significantly changed from the prior PET-CT, measuring 4.3 cm (previously 4.2 cm). There is extensive thoracic  aortic atherosclerosis. Coronary artery atherosclerosis is also noted. No pericardial effusion. Mediastinum/Nodes: Subpectoral and axillary lymphadenopathy is again seen. Right axillary lymph nodes measure up to 11 mm in short axis, similar to the prior PET-CT. Left axillary lymph nodes have decreased in size, currently measuring up to 14 mm in short axis (previously 29 mm). Mediastinal and hilar lymph nodes have mildly enlarged and measure up to 1 cm in short axis. Lungs/Pleura: No pleural effusion or pneumothorax. Evaluation of the lung parenchyma is mildly limited by respiratory motion artifact. There are patchy areas of ground-glass opacity bilaterally, involving the upper lobes greater than lower lobes. Underlying centrilobular emphysema is again noted. No lung mass. Upper Abdomen: Partially visualized splenomegaly. Musculoskeletal: No suspicious lytic or blastic osseous lesion. Small, scattered Schmorl's nodes in the spine. Review of the MIP images confirms the above findings. IMPRESSION: 1. Motion degraded examination. No proximal segmental or more central pulmonary emboli identified. 2. Axillary and subpectoral lymphadenopathy consistent with history of leukemia. Left axillary lymphadenopathy has improved from 2015. 3. Mild mediastinal and bilateral hilar lymphadenopathy. 4. Ground-glass opacities in both lungs. The differential diagnosis is broad and  includes pneumonia (including viral and opportunistic infection) as well as noninfectious inflammatory causes. 5. Aortic atherosclerosis. Electronically Signed   By: Logan Bores M.D.   On: 10/04/2016 20:36    Assessment & Plan:   Problem List Items Addressed This Visit    Postsurgical hypothyroidism    Thyroid function was  very overactive on prior dose in February and dose was increased at that time. it is still overactive, but goal is TSH suppression given history of overactive thyroids . No changes today .  Lab Results  Component Value Date   TSH  0.565 10/04/2016        CLL (chronic lymphocytic leukemia) ([place])    She has a chronic leukocytosis that is historically < 30K   And is monitored for CLL progression by Oncology .6 month follow up in November with Dr Grayland Ormond is planned   Lab Results  Component Value Date   WBC 29.7 Repeated and verified X2. ([place]) 10/14/2016   HGB 14.5 10/14/2016   HCT 44.5 10/14/2016   MCV 84.9 10/14/2016   PLT 300.0 10/14/2016         Hyponatremia    Secondary to HCTZ initially found in May,  Improved with medication change.    Lab Results  Component Value Date   NA 133 (L) 10/14/2016   K 4.4 10/14/2016   CL 90 (L) 10/14/2016   CO2 34 (H) 10/14/2016         Chronic respiratory failure with hypoxia, on home O2 therapy [facility])    She has been 02 dependent since her hospitalization in early October for pneumonia.  She has a need for a portable 02 tank as she has demonstrated ambulatory desaturations today  . Exam suggestive of COPD with superimposed fibrosis. However she has no history of wheezing.   PFTS and pulmonary consult ordered, portable 02 tank ordered as well.        Other Visit Diagnoses    Chronic respiratory failure with hypoxia ([place])    -  Primary   Relevant Orders   Ambulatory referral to Pulmonology   Pulmonary Function Test [facility] Only     A total of 25 minutes of face to face time was spent with patient more than half of which was spent in counselling about the above mentioned conditions  and coordination of care   I am having Ms. Boffa maintain her acetaminophen, losartan, gabapentin, levothyroxine, Vitamin D, amLODipine, feeding supplement (ENSURE ENLIVE), and mirtazapine.  No orders of the defined types were placed in this encounter.   There are no discontinued medications.  Follow-up: No Follow-up on file.   Crecencio Mc, MD

## 2016-10-29 DIAGNOSIS — E46 Unspecified protein-calorie malnutrition: Secondary | ICD-10-CM | POA: Diagnosis not present

## 2016-10-29 DIAGNOSIS — E785 Hyperlipidemia, unspecified: Secondary | ICD-10-CM | POA: Diagnosis not present

## 2016-10-29 DIAGNOSIS — I10 Essential (primary) hypertension: Secondary | ICD-10-CM | POA: Diagnosis not present

## 2016-10-29 DIAGNOSIS — J189 Pneumonia, unspecified organism: Secondary | ICD-10-CM | POA: Diagnosis not present

## 2016-10-29 DIAGNOSIS — C9110 Chronic lymphocytic leukemia of B-cell type not having achieved remission: Secondary | ICD-10-CM | POA: Diagnosis not present

## 2016-10-29 DIAGNOSIS — R531 Weakness: Secondary | ICD-10-CM | POA: Diagnosis not present

## 2016-10-31 ENCOUNTER — Encounter: Payer: Self-pay | Admitting: Internal Medicine

## 2016-10-31 NOTE — Assessment & Plan Note (Addendum)
Thyroid function was  very overactive on prior dose in February and dose was increased at that time. it is still overactive, but goal is TSH suppression given history of overactive thyroids . No changes today .  Lab Results  Component Value Date   TSH 0.565 10/04/2016

## 2016-10-31 NOTE — Assessment & Plan Note (Addendum)
She has a chronic leukocytosis that is historically < 30K   And is monitored for CLL progression by Oncology .6 month follow up in November with Dr Grayland Ormond is planned   Lab Results  Component Value Date   WBC 29.7 Repeated and verified X2. (HH) 10/14/2016   HGB 14.5 10/14/2016   HCT 44.5 10/14/2016   MCV 84.9 10/14/2016   PLT 300.0 10/14/2016

## 2016-10-31 NOTE — Assessment & Plan Note (Signed)
Secondary to HCTZ initially found in May,  Improved with medication change.    Lab Results  Component Value Date   NA 133 (L) 10/14/2016   K 4.4 10/14/2016   CL 90 (L) 10/14/2016   CO2 34 (H) 10/14/2016

## 2016-10-31 NOTE — Assessment & Plan Note (Addendum)
She has been 02 dependent since her hospitalization in early October for pneumonia.  She has a need for a portable 02 tank as she has demonstrated ambulatory desaturations today  . Exam suggestive of COPD with superimposed fibrosis. However she has no history of wheezing.   PFTS and pulmonary consult ordered, portable 02 tank ordered as well.

## 2016-11-02 DIAGNOSIS — C9110 Chronic lymphocytic leukemia of B-cell type not having achieved remission: Secondary | ICD-10-CM | POA: Diagnosis not present

## 2016-11-02 DIAGNOSIS — E46 Unspecified protein-calorie malnutrition: Secondary | ICD-10-CM | POA: Diagnosis not present

## 2016-11-02 DIAGNOSIS — E785 Hyperlipidemia, unspecified: Secondary | ICD-10-CM | POA: Diagnosis not present

## 2016-11-02 DIAGNOSIS — R531 Weakness: Secondary | ICD-10-CM | POA: Diagnosis not present

## 2016-11-02 DIAGNOSIS — I10 Essential (primary) hypertension: Secondary | ICD-10-CM | POA: Diagnosis not present

## 2016-11-02 DIAGNOSIS — J189 Pneumonia, unspecified organism: Secondary | ICD-10-CM | POA: Diagnosis not present

## 2016-11-03 ENCOUNTER — Ambulatory Visit: Payer: Medicare Other | Attending: Internal Medicine

## 2016-11-03 DIAGNOSIS — C9110 Chronic lymphocytic leukemia of B-cell type not having achieved remission: Secondary | ICD-10-CM | POA: Diagnosis not present

## 2016-11-03 DIAGNOSIS — E46 Unspecified protein-calorie malnutrition: Secondary | ICD-10-CM | POA: Diagnosis not present

## 2016-11-03 DIAGNOSIS — J9611 Chronic respiratory failure with hypoxia: Secondary | ICD-10-CM

## 2016-11-03 DIAGNOSIS — J189 Pneumonia, unspecified organism: Secondary | ICD-10-CM | POA: Diagnosis not present

## 2016-11-03 DIAGNOSIS — I10 Essential (primary) hypertension: Secondary | ICD-10-CM | POA: Diagnosis not present

## 2016-11-03 DIAGNOSIS — E785 Hyperlipidemia, unspecified: Secondary | ICD-10-CM | POA: Diagnosis not present

## 2016-11-03 DIAGNOSIS — R531 Weakness: Secondary | ICD-10-CM | POA: Diagnosis not present

## 2016-11-03 MED ORDER — ALBUTEROL SULFATE (2.5 MG/3ML) 0.083% IN NEBU
2.5000 mg | INHALATION_SOLUTION | Freq: Once | RESPIRATORY_TRACT | Status: AC
  Administered 2016-11-03: 2.5 mg via RESPIRATORY_TRACT
  Filled 2016-11-03: qty 3

## 2016-11-03 NOTE — Progress Notes (Unsigned)
Very unsteady on feet,6 minute walk not performed.Methacholine not administered due to Fev1 of 48% of predicted

## 2016-11-04 ENCOUNTER — Telehealth: Payer: Self-pay | Admitting: Internal Medicine

## 2016-11-04 ENCOUNTER — Ambulatory Visit: Payer: Medicare Other

## 2016-11-04 DIAGNOSIS — C9110 Chronic lymphocytic leukemia of B-cell type not having achieved remission: Secondary | ICD-10-CM | POA: Diagnosis not present

## 2016-11-04 DIAGNOSIS — J189 Pneumonia, unspecified organism: Secondary | ICD-10-CM | POA: Diagnosis not present

## 2016-11-04 DIAGNOSIS — E46 Unspecified protein-calorie malnutrition: Secondary | ICD-10-CM | POA: Diagnosis not present

## 2016-11-04 DIAGNOSIS — I10 Essential (primary) hypertension: Secondary | ICD-10-CM | POA: Diagnosis not present

## 2016-11-04 DIAGNOSIS — E785 Hyperlipidemia, unspecified: Secondary | ICD-10-CM | POA: Diagnosis not present

## 2016-11-04 DIAGNOSIS — R531 Weakness: Secondary | ICD-10-CM | POA: Diagnosis not present

## 2016-11-04 NOTE — Telephone Encounter (Signed)
Larene Beach from [facility] called and stated that pt has been discharged from [place] but will continue in home pt. Thank you!  Call @ [phone]

## 2016-11-04 NOTE — Telephone Encounter (Signed)
fyi

## 2016-11-05 ENCOUNTER — Ambulatory Visit: Payer: Medicare Other | Admitting: Oncology

## 2016-11-05 ENCOUNTER — Other Ambulatory Visit: Payer: Medicare Other

## 2016-11-09 ENCOUNTER — Encounter: Payer: Self-pay | Admitting: Internal Medicine

## 2016-11-09 ENCOUNTER — Ambulatory Visit (INDEPENDENT_AMBULATORY_CARE_PROVIDER_SITE_OTHER): Payer: Medicare Other | Admitting: Internal Medicine

## 2016-11-09 VITALS — BP 128/72 | HR 77 | Ht 62.0 in | Wt 151.6 lb

## 2016-11-09 DIAGNOSIS — J441 Chronic obstructive pulmonary disease with (acute) exacerbation: Secondary | ICD-10-CM

## 2016-11-09 DIAGNOSIS — J8410 Pulmonary fibrosis, unspecified: Secondary | ICD-10-CM | POA: Diagnosis not present

## 2016-11-09 DIAGNOSIS — J438 Other emphysema: Secondary | ICD-10-CM | POA: Diagnosis not present

## 2016-11-09 MED ORDER — UMECLIDINIUM BROMIDE 62.5 MCG/INH IN AEPB: 1.0000 | INHALATION_SPRAY | Freq: Every day | RESPIRATORY_TRACT | 5 refills | Status: DC

## 2016-11-09 MED ORDER — UMECLIDINIUM BROMIDE 62.5 MCG/INH IN AEPB: 1.0000 | INHALATION_SPRAY | Freq: Every day | RESPIRATORY_TRACT | 0 refills | Status: AC

## 2016-11-09 NOTE — Patient Instructions (Addendum)
--  Will start Incruz inhaler once daily.   --Increase your activity level, will refer to pulmonary rehab.   -It is not necessary to use oxygen at rest, however, he should continue to use it when your active, and he should continue to use it at night.

## 2016-11-09 NOTE — Progress Notes (Signed)
Patient ID: Lisa Figueroa, female   DOB: 1932-06-23, 82 y.o.   MRN: [id number] Patient seen in the office today and instructed on use of INCRUSE ELLIPTA.  Patient expressed understanding and demonstrated technique.

## 2016-11-09 NOTE — Addendum Note (Signed)
Addended by: Maryanna Shape A on: 11/09/2016 11:50 AM   Modules accepted: Orders

## 2016-11-09 NOTE — Progress Notes (Signed)
[facility] Consultation      Assessment and Plan:  COPD -Changes of emphysema noted on CT, as well as Pulmicort function testing. -Chronic hypoxic respiratory failure, currently on oxygen. -Mild dyspnea, will start Incruz inhaler.  Interstitial Lung disease.  --Appears to have underlying ILD, suspect some degree of pulmonary fibrosis.  -Groundglass changes seen on CT chest, doubt IPF, likely NSIP. No intervention at this time.  Debility/deconditioning.  -I suspect that this is the greatest modifiable  driver of her dyspnea, she has chronic fatigue and is very sedentary. -We'll refer to pulmonary rehabilitation, encouraged increase activity level.  Date: 11/09/2016  MRN# [id number] Lisa Figueroa 01/20/1932   Lisa Figueroa is a 82 y.o. old female seen in consultation for chief complaint of:    Chief Complaint  Patient presents with  . pulmonary consult    per Dr. Derrel Nip. pt had recent admission on 10-04-16 for PNA & SOB. pt currently on 2L 02. pt c/o sob occ non prod cough wheezing.    HPI:   Lisa Figueroa  is an 82 year old female with history of CLL. She was recently admitted with acute hypoxic respiratory failure due to pneumonia, after that discharged on oxygen. She is currently on 2L of oxygen, she is present today with her sister who is here caregiver and gives much of her history.  She lives by herself, she was tired much of the times, she felt "old". She does not do housework, she can bathe herself and dress herself.  She does her laundry, she can cook some, she does not clean because she feels too tired.   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 Smoked cigarettes daily from  age of 76 to age 85 .  Images reviewed from chest x-ray 10/18, CT chest 10/04/16: Diffuse interstitial changes, some emphysematous changes, mild groundglass changes in the upper lobes.  Review of PFT  11/03/16: FVC was 56% predicted, FEV1 50% of predicted, ratio was 56%. DLCO was 42% of predicted. Appears consistent with both restrictive and obstructive lung disease.  Desat walk: sat dropped after 300 feet to 87%.   PMHX:   Past Medical History:  Diagnosis Date  . CLL (chronic lymphocytic leukemia) ([place])   . History of colon polyps   . History of shingles   . Hyperlipidemia   . Hypertension   . Post herpetic neuralgia   . Shingles   . Thyroid cancer ([place]) 1998  . Thyroid cancer [facility])    Surgical Hx:  Past Surgical History:  Procedure Laterality Date  . ABDOMINAL HYSTERECTOMY    . APPENDECTOMY    . BREAST SURGERY Right    cyst excision  . CARDIAC CATHETERIZATION  05/18/2008   At [facility]: mild 30% stenosis in LCX. normal EF.    Marland Kitchen CATARACT EXTRACTION W/PHACO Left 06/04/2015   Procedure: CATARACT EXTRACTION PHACO AND INTRAOCULAR LENS PLACEMENT (IOC);  Surgeon: Birder Robson, MD;  Location: [facility] ORS;  Service: Ophthalmology;  Laterality: Left;  [place] 00:34.1 AP% 23.0 CDE 7.85  . CHOLECYSTECTOMY    . HEMORRHOID SURGERY    . TOTAL THYROIDECTOMY  1998   Family Hx:  Family History  Problem Relation Age of Onset  . Cancer Brother     prostate and bone  . Heart disease Mother   . Heart disease Father   . Cancer Maternal Aunt     breast  . Cancer Cousin  breast - maternal side   Social Hx:   Social History  Substance Use Topics  . Smoking status: Former Smoker    Packs/day: 1.00    Years: 20.00    Quit date: 08/08/1984  . Smokeless tobacco: Never Used  . Alcohol use 0.0 oz/week     Comment: wine glass daily   Medication:   Reviewed.     Allergies:  Penicillin g and Prednisone  Review of Systems: Gen:  Denies  fever, sweats, chills HEENT: Denies blurred vision, double vision. bleeds, sore throat Cvc:  No dizziness, chest pain. Resp:   Denies cough or sputum production, shortness of breath Gi: Denies swallowing difficulty, stomach pain. Gu:  Denies bladder  incontinence, burning urine Ext:   No Joint pain, stiffness. Skin: No skin rash,  hives  Endoc:  No polyuria, polydipsia. Psych: No depression, insomnia. Other:  All other systems were reviewed with the patient and were negative other that what is mentioned in the HPI.   Physical Examination:   VS: BP 128/72 (BP Location: Left Arm, Cuff Size: Normal)   Pulse 77   Ht 5\' 2"  (1.575 m)   Wt 151 lb 9.6 oz (68.8 kg)   SpO2 92%   BMI 27.73 kg/m   General Appearance: No distress  Neuro:without focal findings,  speech normal,  HEENT: PERRLA, EOM intact.   Pulmonary: normal breath sounds, Decreased air entry bilaterally. CardiovascularNormal S1,S2.  No m/r/g.   Abdomen: Benign, Soft, non-tender. Renal:  No costovertebral tenderness  GU:  No performed at this time. Endoc: No evident thyromegaly, no signs of acromegaly. Skin:   warm, no rashes, no ecchymosis  Extremities: normal, no cyanosis, clubbing.  Other findings:    LABORATORY PANEL:   CBC No results for input(s): WBC, HGB, HCT, PLT in the last 168 hours. ------------------------------------------------------------------------------------------------------------------  Chemistries  No results for input(s): NA, K, CL, CO2, GLUCOSE, BUN, CREATININE, CALCIUM, MG, AST, ALT, ALKPHOS, BILITOT in the last 168 hours.  Invalid input(s): GFRCGP ------------------------------------------------------------------------------------------------------------------  Cardiac Enzymes No results for input(s): TROPONINI in the last 168 hours. ------------------------------------------------------------  RADIOLOGY:  No results found.     Thank  you for the consultation and for allowing [facility] to assist in the care of your patient. Our recommendations are noted above.  Please contact us if we can be of further service.   Marda Stalker, MD.  Board Certified in Internal Medicine, Pulmonary Medicine,  [place], and Sleep Medicine.  [facility] Office Number: [phone]  Patricia Pesa, M.D.  Vilinda Boehringer, M.D.  Merton Border, M.D  11/09/2016

## 2016-11-10 DIAGNOSIS — E785 Hyperlipidemia, unspecified: Secondary | ICD-10-CM | POA: Diagnosis not present

## 2016-11-10 DIAGNOSIS — J189 Pneumonia, unspecified organism: Secondary | ICD-10-CM | POA: Diagnosis not present

## 2016-11-10 DIAGNOSIS — I10 Essential (primary) hypertension: Secondary | ICD-10-CM | POA: Diagnosis not present

## 2016-11-10 DIAGNOSIS — C9110 Chronic lymphocytic leukemia of B-cell type not having achieved remission: Secondary | ICD-10-CM | POA: Diagnosis not present

## 2016-11-10 DIAGNOSIS — E46 Unspecified protein-calorie malnutrition: Secondary | ICD-10-CM | POA: Diagnosis not present

## 2016-11-10 DIAGNOSIS — R531 Weakness: Secondary | ICD-10-CM | POA: Diagnosis not present

## 2016-11-12 DIAGNOSIS — R531 Weakness: Secondary | ICD-10-CM | POA: Diagnosis not present

## 2016-11-12 DIAGNOSIS — E46 Unspecified protein-calorie malnutrition: Secondary | ICD-10-CM | POA: Diagnosis not present

## 2016-11-12 DIAGNOSIS — E785 Hyperlipidemia, unspecified: Secondary | ICD-10-CM | POA: Diagnosis not present

## 2016-11-12 DIAGNOSIS — I10 Essential (primary) hypertension: Secondary | ICD-10-CM | POA: Diagnosis not present

## 2016-11-12 DIAGNOSIS — C9110 Chronic lymphocytic leukemia of B-cell type not having achieved remission: Secondary | ICD-10-CM | POA: Diagnosis not present

## 2016-11-12 DIAGNOSIS — J189 Pneumonia, unspecified organism: Secondary | ICD-10-CM | POA: Diagnosis not present

## 2016-11-17 DIAGNOSIS — R531 Weakness: Secondary | ICD-10-CM | POA: Diagnosis not present

## 2016-11-17 DIAGNOSIS — J189 Pneumonia, unspecified organism: Secondary | ICD-10-CM | POA: Diagnosis not present

## 2016-11-17 DIAGNOSIS — E785 Hyperlipidemia, unspecified: Secondary | ICD-10-CM | POA: Diagnosis not present

## 2016-11-17 DIAGNOSIS — C9110 Chronic lymphocytic leukemia of B-cell type not having achieved remission: Secondary | ICD-10-CM | POA: Diagnosis not present

## 2016-11-17 DIAGNOSIS — E46 Unspecified protein-calorie malnutrition: Secondary | ICD-10-CM | POA: Diagnosis not present

## 2016-11-17 DIAGNOSIS — I10 Essential (primary) hypertension: Secondary | ICD-10-CM | POA: Diagnosis not present

## 2016-11-20 DIAGNOSIS — R531 Weakness: Secondary | ICD-10-CM | POA: Diagnosis not present

## 2016-11-20 DIAGNOSIS — C9110 Chronic lymphocytic leukemia of B-cell type not having achieved remission: Secondary | ICD-10-CM | POA: Diagnosis not present

## 2016-11-20 DIAGNOSIS — J189 Pneumonia, unspecified organism: Secondary | ICD-10-CM | POA: Diagnosis not present

## 2016-11-20 DIAGNOSIS — E46 Unspecified protein-calorie malnutrition: Secondary | ICD-10-CM | POA: Diagnosis not present

## 2016-11-20 DIAGNOSIS — I10 Essential (primary) hypertension: Secondary | ICD-10-CM | POA: Diagnosis not present

## 2016-11-20 DIAGNOSIS — E785 Hyperlipidemia, unspecified: Secondary | ICD-10-CM | POA: Diagnosis not present

## 2016-11-24 DIAGNOSIS — C9110 Chronic lymphocytic leukemia of B-cell type not having achieved remission: Secondary | ICD-10-CM | POA: Diagnosis not present

## 2016-11-24 DIAGNOSIS — R531 Weakness: Secondary | ICD-10-CM | POA: Diagnosis not present

## 2016-11-24 DIAGNOSIS — J189 Pneumonia, unspecified organism: Secondary | ICD-10-CM | POA: Diagnosis not present

## 2016-11-24 DIAGNOSIS — I10 Essential (primary) hypertension: Secondary | ICD-10-CM | POA: Diagnosis not present

## 2016-11-24 DIAGNOSIS — E785 Hyperlipidemia, unspecified: Secondary | ICD-10-CM | POA: Diagnosis not present

## 2016-11-24 DIAGNOSIS — E46 Unspecified protein-calorie malnutrition: Secondary | ICD-10-CM | POA: Diagnosis not present

## 2016-11-26 DIAGNOSIS — E785 Hyperlipidemia, unspecified: Secondary | ICD-10-CM | POA: Diagnosis not present

## 2016-11-26 DIAGNOSIS — E46 Unspecified protein-calorie malnutrition: Secondary | ICD-10-CM | POA: Diagnosis not present

## 2016-11-26 DIAGNOSIS — R531 Weakness: Secondary | ICD-10-CM | POA: Diagnosis not present

## 2016-11-26 DIAGNOSIS — C9110 Chronic lymphocytic leukemia of B-cell type not having achieved remission: Secondary | ICD-10-CM | POA: Diagnosis not present

## 2016-11-26 DIAGNOSIS — I10 Essential (primary) hypertension: Secondary | ICD-10-CM | POA: Diagnosis not present

## 2016-11-26 DIAGNOSIS — J189 Pneumonia, unspecified organism: Secondary | ICD-10-CM | POA: Diagnosis not present

## 2016-11-30 DIAGNOSIS — E46 Unspecified protein-calorie malnutrition: Secondary | ICD-10-CM | POA: Diagnosis not present

## 2016-11-30 DIAGNOSIS — J189 Pneumonia, unspecified organism: Secondary | ICD-10-CM | POA: Diagnosis not present

## 2016-11-30 DIAGNOSIS — E785 Hyperlipidemia, unspecified: Secondary | ICD-10-CM | POA: Diagnosis not present

## 2016-11-30 DIAGNOSIS — C9110 Chronic lymphocytic leukemia of B-cell type not having achieved remission: Secondary | ICD-10-CM | POA: Diagnosis not present

## 2016-11-30 DIAGNOSIS — R531 Weakness: Secondary | ICD-10-CM | POA: Diagnosis not present

## 2016-11-30 DIAGNOSIS — I10 Essential (primary) hypertension: Secondary | ICD-10-CM | POA: Diagnosis not present

## 2016-12-01 DIAGNOSIS — J189 Pneumonia, unspecified organism: Secondary | ICD-10-CM | POA: Diagnosis not present

## 2016-12-01 DIAGNOSIS — C9110 Chronic lymphocytic leukemia of B-cell type not having achieved remission: Secondary | ICD-10-CM | POA: Diagnosis not present

## 2016-12-01 DIAGNOSIS — E46 Unspecified protein-calorie malnutrition: Secondary | ICD-10-CM | POA: Diagnosis not present

## 2016-12-01 DIAGNOSIS — R531 Weakness: Secondary | ICD-10-CM | POA: Diagnosis not present

## 2016-12-01 DIAGNOSIS — E785 Hyperlipidemia, unspecified: Secondary | ICD-10-CM | POA: Diagnosis not present

## 2016-12-01 DIAGNOSIS — I10 Essential (primary) hypertension: Secondary | ICD-10-CM | POA: Diagnosis not present

## 2016-12-14 ENCOUNTER — Other Ambulatory Visit: Payer: Self-pay | Admitting: *Deleted

## 2016-12-14 DIAGNOSIS — C9110 Chronic lymphocytic leukemia of B-cell type not having achieved remission: Secondary | ICD-10-CM

## 2016-12-14 NOTE — Progress Notes (Signed)
[place]  [phone]  Fax:([phone]) [place] DOB: [birth year]  MR#: [id number]  [id number]  Patient Care Team: Crecencio Mc, MD as PCP - General (Internal Medicine) [name], MD as Consulting Physician (General Surgery)  CHIEF COMPLAINT: CLL  INTERVAL HISTORY:  Patient returns to clinic today for repeat laboratory work and further evaluation. She continues to have persistent weakness and fatigue. She also now requires the use of oxygen at night. She denies any fevers, chills, night sweats, or weight loss. She has no neurologic complaints. She denies any chest pain, cough, or hemoptysis. Patient denies any nausea, vomiting, constipation, or diarrhea. She has no urinary complaints. Patient otherwise feels well and offers no further specific complaints.   REVIEW OF SYSTEMS:   Review of Systems  Constitutional: Positive for malaise/fatigue. Negative for chills, fever and weight loss.  Respiratory: Positive for sputum production and shortness of breath. Negative for cough.   Cardiovascular: Negative.  Negative for chest pain and leg swelling.  Gastrointestinal: Negative.  Negative for abdominal pain, nausea and vomiting.  Genitourinary: Negative.   Musculoskeletal: Negative.   Neurological: Positive for weakness.  Psychiatric/Behavioral: The patient has insomnia.     As per HPI. Otherwise, a complete review of systems is negative.  ONCOLOGY HISTORY: Oncology History    1. Monoclonal population of lymphocytes.  Lymphoproliferative disease. 82 year old lady who had an abnormal mammogram mid axillary lymphadenopathy core biopsies which he suggested a lymphoproliferative disease positive staining for CD5 and CD20.     CLL (chronic lymphocytic leukemia) ([place]) (Resolved)   11/18/2014 Initial Diagnosis    CLL (chronic lymphocytic leukemia)       PAST MEDICAL HISTORY: Past Medical History:  Diagnosis Date  . CLL  (chronic lymphocytic leukemia) ([place])   . History of colon polyps   . History of shingles   . Hyperlipidemia   . Hypertension   . Post herpetic neuralgia   . Shingles   . Thyroid cancer ([place]) 1998  . Thyroid cancer ([place])     PAST SURGICAL HISTORY: Past Surgical History:  Procedure Laterality Date  . ABDOMINAL HYSTERECTOMY    . APPENDECTOMY    . BREAST SURGERY Right    cyst excision  . CARDIAC CATHETERIZATION  05/18/2008   At [facility]: mild 30% stenosis in LCX. normal EF.    Marland Kitchen CATARACT EXTRACTION W/PHACO Left 06/04/2015   Procedure: CATARACT EXTRACTION PHACO AND INTRAOCULAR LENS PLACEMENT (IOC);  Surgeon: Birder Robson, MD;  Location: [facility] ORS;  Service: Ophthalmology;  Laterality: Left;  [place] 00:34.1 AP% 23.0 CDE 7.85  . CHOLECYSTECTOMY    . HEMORRHOID SURGERY    . TOTAL THYROIDECTOMY  1998    FAMILY HISTORY Family History  Problem Relation Age of Onset  . Cancer Brother     prostate and bone  . Heart disease Mother   . Heart disease Father   . Cancer Maternal Aunt     breast  . Cancer Cousin     breast - maternal side    GYNECOLOGIC HISTORY:  No LMP recorded. Patient has had a hysterectomy.     ADVANCED DIRECTIVES:    HEALTH MAINTENANCE: Social History  Substance Use Topics  . Smoking status: Former Smoker    Packs/day: 1.00    Years: 20.00    Quit date: 08/08/1984  . Smokeless tobacco: Never Used  . Alcohol use 0.0 oz/week     Comment: wine glass daily     Colonoscopy:  PAP:  Bone density:March 2017  Mammogram:May 2016  Allergies  Allergen Reactions  . Penicillin G Swelling  . Prednisone Other (See Comments)    crys and can't sleep crys and can't sleep    Current Outpatient Prescriptions  Medication Sig Dispense Refill  . acetaminophen (TYLENOL) 500 MG tablet Take 500 mg by mouth daily.    Marland Kitchen amLODipine (NORVASC) 2.5 MG tablet Take 1 tablet (2.5 mg total) by mouth daily. 90 tablet 3  . Cholecalciferol (VITAMIN D) 2000 units CAPS Take by mouth  daily.    . feeding supplement, ENSURE ENLIVE, (ENSURE ENLIVE) LIQD Take 237 mLs by mouth 2 (two) times daily between meals. 237 mL 12  . gabapentin (NEURONTIN) 400 MG capsule Take 1 capsule (400 mg total) by mouth 2 (two) times daily. 270 capsule 0  . levothyroxine (SYNTHROID, LEVOTHROID) 150 MCG tablet Take 1 tablet (150 mcg total) by mouth daily. 90 tablet 0  . losartan (COZAAR) 100 MG tablet Take 1 tablet (100 mg total) by mouth daily. 90 tablet 3  . OXYGEN Inhale 2 L into the lungs. 2 liters @ night    . INCRUSE ELLIPTA 62.5 MCG/INH AEPB     . mirtazapine (REMERON) 15 MG tablet Take 1 tablet (15 mg total) by mouth at bedtime. (Patient not taking: Reported on 12/15/2016) 30 tablet 0   No current facility-administered medications for this visit.     OBJECTIVE: BP (!) 150/86 (BP Location: Right Arm, Patient Position: Sitting)   Pulse 70   Temp (!) 96 F (35.6 C) (Tympanic)   Resp 18   Wt 155 lb 10.3 oz (70.6 kg)   BMI 28.47 kg/m    Body mass index is 28.47 kg/m.    ECOG FS:1 - Symptomatic but completely ambulatory  General: Well-developed, well-nourished, no acute distress. Eyes: Pink conjunctiva, anicteric sclera. Lungs: Clear to auscultation bilaterally. Heart: Regular rate and rhythm. No rubs, murmurs, or gallops. Abdomen: Soft, nontender, nondistended. No organomegaly noted, normoactive bowel sounds. Musculoskeletal: No edema, cyanosis, or clubbing. Neuro: Alert, answering all questions appropriately. Cranial nerves grossly intact. Skin: No rashes or petechiae noted. Psych: Normal affect.  LAB RESULTS:  Appointment on 12/15/2016  Component Date Value Ref Range Status  . WBC 12/15/2016 24.3* 3.6 - 11.0 K/uL Final  . RBC 12/15/2016 4.78  3.80 - 5.20 MIL/uL Final  . Hemoglobin 12/15/2016 13.0  12.0 - 16.0 g/dL Final  . HCT 12/15/2016 40.3  35.0 - 47.0 % Final  . MCV 12/15/2016 84.1  80.0 - 100.0 fL Final  . MCH 12/15/2016 27.2  26.0 - 34.0 pg Final  . MCHC 12/15/2016  32.3  32.0 - 36.0 g/dL Final  . RDW 12/15/2016 15.1* 11.5 - 14.5 % Final  . Platelets 12/15/2016 165  150 - 440 K/uL Final  . Neutrophils Relative % 12/15/2016 14  % Final  . Neutro Abs 12/15/2016 3.3  1.4 - 6.5 K/uL Final  . Lymphocytes Relative 12/15/2016 83  % Final  . Lymphs Abs 12/15/2016 20.2* 1.0 - 3.6 K/uL Final  . Monocytes Relative 12/15/2016 2  % Final  . Monocytes Absolute 12/15/2016 0.6  0.2 - 0.9 K/uL Final  . Eosinophils Relative 12/15/2016 1  % Final  . Eosinophils Absolute 12/15/2016 0.1  0 - 0.7 K/uL Final  . Basophils Relative 12/15/2016 0  % Final  . Basophils Absolute 12/15/2016 0.1  0 - 0.1 K/uL Final    STUDIES: No results found.  ASSESSMENT:  Chronic lymphocytic leukemia, Rai stage 0.  PLAN:  1. CLL: Diagnosis previously confirmed her peripheral blood flow cytometry. Patient's white blood cell count continues to be mildly increased, but essentially stable. The remainder of her blood counts are within normal limits. No intervention is needed at this time. Return to clinic in 6 months with repeat laboratory work and further evaluation.  2. Shortness breath: Continue oxygen at night as needed. 3. Hypertension: Patient's blood pressure is mildly elevated today. Continue current medications.  Patient expressed understanding and was in agreement with this plan. She also understands that She can call clinic at any time with any questions, concerns, or complaints.    Lloyd Huger, MD   12/19/2016 7:45 AM

## 2016-12-15 ENCOUNTER — Inpatient Hospital Stay: Payer: Medicare Other | Attending: Oncology | Admitting: Oncology

## 2016-12-15 ENCOUNTER — Inpatient Hospital Stay: Payer: Medicare Other

## 2016-12-15 VITALS — BP 150/86 | HR 70 | Temp 96.0°F | Resp 18 | Wt 155.6 lb

## 2016-12-15 DIAGNOSIS — R531 Weakness: Secondary | ICD-10-CM | POA: Insufficient documentation

## 2016-12-15 DIAGNOSIS — I10 Essential (primary) hypertension: Secondary | ICD-10-CM | POA: Insufficient documentation

## 2016-12-15 DIAGNOSIS — C9110 Chronic lymphocytic leukemia of B-cell type not having achieved remission: Secondary | ICD-10-CM | POA: Diagnosis not present

## 2016-12-15 DIAGNOSIS — Z803 Family history of malignant neoplasm of breast: Secondary | ICD-10-CM | POA: Diagnosis not present

## 2016-12-15 DIAGNOSIS — R5383 Other fatigue: Secondary | ICD-10-CM

## 2016-12-15 DIAGNOSIS — Z86010 Personal history of colonic polyps: Secondary | ICD-10-CM | POA: Insufficient documentation

## 2016-12-15 DIAGNOSIS — Z85850 Personal history of malignant neoplasm of thyroid: Secondary | ICD-10-CM | POA: Diagnosis not present

## 2016-12-15 DIAGNOSIS — E785 Hyperlipidemia, unspecified: Secondary | ICD-10-CM

## 2016-12-15 DIAGNOSIS — Z8619 Personal history of other infectious and parasitic diseases: Secondary | ICD-10-CM | POA: Diagnosis not present

## 2016-12-15 DIAGNOSIS — Z8041 Family history of malignant neoplasm of ovary: Secondary | ICD-10-CM | POA: Diagnosis not present

## 2016-12-15 DIAGNOSIS — Z87891 Personal history of nicotine dependence: Secondary | ICD-10-CM | POA: Diagnosis not present

## 2016-12-15 DIAGNOSIS — G4700 Insomnia, unspecified: Secondary | ICD-10-CM | POA: Diagnosis not present

## 2016-12-15 DIAGNOSIS — Z79899 Other long term (current) drug therapy: Secondary | ICD-10-CM | POA: Diagnosis not present

## 2016-12-15 LAB — CBC WITH DIFFERENTIAL/PLATELET
BASOS ABS: 0.1 10*3/uL (ref 0–0.1)
BASOS PCT: 0 %
EOS ABS: 0.1 10*3/uL (ref 0–0.7)
EOS PCT: 1 %
HCT: 40.3 % (ref 35.0–47.0)
HEMOGLOBIN: 13 g/dL (ref 12.0–16.0)
Lymphocytes Relative: 83 %
Lymphs Abs: 20.2 10*3/uL — ABNORMAL HIGH (ref 1.0–3.6)
MCH: 27.2 pg (ref 26.0–34.0)
MCHC: 32.3 g/dL (ref 32.0–36.0)
MCV: 84.1 fL (ref 80.0–100.0)
Monocytes Absolute: 0.6 10*3/uL (ref 0.2–0.9)
Monocytes Relative: 2 %
NEUTROS PCT: 14 %
Neutro Abs: 3.3 10*3/uL (ref 1.4–6.5)
PLATELETS: 165 10*3/uL (ref 150–440)
RBC: 4.78 MIL/uL (ref 3.80–5.20)
RDW: 15.1 % — ABNORMAL HIGH (ref 11.5–14.5)
WBC: 24.3 10*3/uL — AB (ref 3.6–11.0)

## 2016-12-15 NOTE — Progress Notes (Signed)
Pt here w sister for follow up.using 02 at home 2 L at night

## 2016-12-23 ENCOUNTER — Telehealth: Payer: Self-pay | Admitting: Internal Medicine

## 2016-12-23 NOTE — Telephone Encounter (Signed)
Reason for call: sore throat, cough non productive, hx of pneumonia  Symptoms:sore throat, raw throat   Nasal drainage. Cough non productive, , head congestion, headache .  No SOB.  Oxygen dependent only at night.   Duration last night  Medications:no medications  Last seen for this problem:  10/28/16 hospital follow up pneumonia Seen by: Dr Derrel Nip   Also patient has hx of chronic leukemia   I advised patient we didn't have any appointments available she is hesitant to go to urgent care due wants you to look at message first. Appointment scheduled with Joycelyn Schmid 12/24/16   Please advise.

## 2016-12-23 NOTE — Telephone Encounter (Signed)
Spoke with patient advised of below and she wants me to call Peggy sister at [phone] and advise of instructions.

## 2016-12-23 NOTE — Telephone Encounter (Signed)
URI is most likely viral given the mild HEENT Symptoms .    explain that in viral URIS, an antibiotic will not help the symptoms and will increase the risk of developing diarrhea.,  She needs to try  oral and nasal decongestants,prn tylenol 650 mq 8 hrs for aches and pains,  Sinus flushes with Milta Deiters Med's rinse,  And benadryl 25 mg at night only , an robitussin for  cough suppressant.   Call if you develop ymptoms of bacterial sinusitis  or recurrent pneumonia ,

## 2016-12-23 NOTE — Telephone Encounter (Signed)
Pt called and is c/o sore throat and saying it is very hard to swollen, pt is just getting over pneumonia. No appts available, pt wants to know if we can call her in something. Please advise, thank you!  Call pt @ [phone]

## 2016-12-23 NOTE — Telephone Encounter (Signed)
Spoke with Vickii Chafe she will call back so I can give her instructions as stated below.

## 2016-12-23 NOTE — Telephone Encounter (Signed)
Patients sister Vickii Chafe advised of below and verbalized an understanding

## 2016-12-24 ENCOUNTER — Ambulatory Visit: Payer: Medicare Other | Admitting: Family

## 2017-01-11 ENCOUNTER — Telehealth: Payer: Self-pay | Admitting: *Deleted

## 2017-01-11 DIAGNOSIS — Z1239 Encounter for other screening for malignant neoplasm of breast: Secondary | ICD-10-CM

## 2017-01-11 NOTE — Telephone Encounter (Signed)
Please advise Last mammo done 04/2015 will like a mammogram referral

## 2017-01-11 NOTE — Telephone Encounter (Signed)
Pt has requested a order for a mammogram, pt has pain in her right breast. Pt has Hx of chronic lymphoma   lukemia.  Pt contact [phone]

## 2017-01-11 NOTE — Telephone Encounter (Signed)
Patient notified

## 2017-01-11 NOTE — Telephone Encounter (Signed)
ORDERED

## 2017-01-25 DIAGNOSIS — D485 Neoplasm of uncertain behavior of skin: Secondary | ICD-10-CM | POA: Diagnosis not present

## 2017-01-25 DIAGNOSIS — Z85828 Personal history of other malignant neoplasm of skin: Secondary | ICD-10-CM | POA: Diagnosis not present

## 2017-01-25 DIAGNOSIS — D0439 Carcinoma in situ of skin of other parts of face: Secondary | ICD-10-CM | POA: Diagnosis not present

## 2017-01-25 DIAGNOSIS — L570 Actinic keratosis: Secondary | ICD-10-CM | POA: Diagnosis not present

## 2017-01-25 DIAGNOSIS — Z08 Encounter for follow-up examination after completed treatment for malignant neoplasm: Secondary | ICD-10-CM | POA: Diagnosis not present

## 2017-01-26 DIAGNOSIS — C44519 Basal cell carcinoma of skin of other part of trunk: Secondary | ICD-10-CM | POA: Diagnosis not present

## 2017-02-15 ENCOUNTER — Ambulatory Visit (INDEPENDENT_AMBULATORY_CARE_PROVIDER_SITE_OTHER): Payer: Medicare Other

## 2017-02-15 ENCOUNTER — Telehealth: Payer: Self-pay | Admitting: Family Medicine

## 2017-02-15 ENCOUNTER — Ambulatory Visit (INDEPENDENT_AMBULATORY_CARE_PROVIDER_SITE_OTHER): Payer: Medicare Other | Admitting: Family Medicine

## 2017-02-15 ENCOUNTER — Encounter: Payer: Self-pay | Admitting: Family Medicine

## 2017-02-15 VITALS — BP 120/82 | HR 64 | Temp 97.7°F | Wt 157.8 lb

## 2017-02-15 DIAGNOSIS — R0781 Pleurodynia: Secondary | ICD-10-CM | POA: Diagnosis not present

## 2017-02-15 DIAGNOSIS — R350 Frequency of micturition: Secondary | ICD-10-CM | POA: Diagnosis not present

## 2017-02-15 DIAGNOSIS — N3001 Acute cystitis with hematuria: Secondary | ICD-10-CM

## 2017-02-15 DIAGNOSIS — N390 Urinary tract infection, site not specified: Secondary | ICD-10-CM | POA: Insufficient documentation

## 2017-02-15 DIAGNOSIS — S2231XA Fracture of one rib, right side, initial encounter for closed fracture: Secondary | ICD-10-CM | POA: Diagnosis not present

## 2017-02-15 DIAGNOSIS — S2239XA Fracture of one rib, unspecified side, initial encounter for closed fracture: Secondary | ICD-10-CM | POA: Insufficient documentation

## 2017-02-15 LAB — POCT URINALYSIS DIPSTICK
Bilirubin, UA: NEGATIVE
Blood, UA: POSITIVE
GLUCOSE UA: NEGATIVE
Ketones, UA: NEGATIVE
Leukocytes, UA: NEGATIVE
Nitrite, UA: NEGATIVE
Protein, UA: NEGATIVE
SPEC GRAV UA: 1.02
Urobilinogen, UA: 0.2
pH, UA: 6

## 2017-02-15 LAB — URINALYSIS, MICROSCOPIC ONLY: RBC / HPF: NONE SEEN (ref 0–?)

## 2017-02-15 MED ORDER — CIPROFLOXACIN HCL 250 MG PO TABS
250.0000 mg | ORAL_TABLET | Freq: Two times a day (BID) | ORAL | 0 refills | Status: DC
Start: 2017-02-15 — End: 2017-02-18

## 2017-02-15 NOTE — Assessment & Plan Note (Signed)
Patient with onset of rib pain after bending over and then sitting up. Suspect intercostal muscle strain though given persistent discomfort and tenderness on exam today we'll obtain x-rays of her right ribs and lungs to ensure that there are no fractures. She does have good air movement and stable vital signs. Discussed monitoring. We'll contact her with the x-ray results. She is given return precautions.

## 2017-02-15 NOTE — Assessment & Plan Note (Signed)
UA with positive blood in combination with her symptoms likely indicate UTI. We will send her urine for culture and microscopy. We will treat with ciprofloxacin given her penicillin allergy that includes swelling of the mouth. Have to avoid Macrobid given her age and Bactrim given her hematologic issues. She's given return precautions.

## 2017-02-15 NOTE — Telephone Encounter (Signed)
Pt called about what she can do and cannot do? Please advise?  Call pt @ [phone]. Thank you!

## 2017-02-15 NOTE — Patient Instructions (Addendum)
Nice to see you. You likely have a UTI given her symptoms and urine results. We'll treat with ciprofloxacin. We'll send her urine for culture and have them look at under the microscope.  Given your right lower rib discomfort as well we will obtain an x-ray of your ribs to ensure that there is nothing broken. We'll contact you with these results. If you have trouble breathing, cough productive of blood, abdominal pain, fevers, or any new or changing symptoms please seek medical attention immediately.

## 2017-02-15 NOTE — Progress Notes (Signed)
Tommi Rumps, MD Phone: [phone]  Lisa Figueroa is a 83 y.o. female who presents today for same-day visit.  Patient notes 2 days of urinary frequency, dysuria, and urgency. She denies vaginal discharge, abdominal pain, and fevers. No history of UTIs.  Patient additionally notes some time 2-3 weeks ago she bent over on the toilet and felt a twinge in her right lower ribs. Since then she's had intermittent discomfort. Hurts most if she moves a particular way, though also occasionally hurts with a deep breath. She notes minimal cough at times that is unchanged from prior to this. No blood in her sputum. No chest pain. No shortness of breath. She was treated for pneumonia late last year.  PMH: Former smoker   ROS see history of present illness  Objective  Physical Exam Vitals:   02/15/17 1308  BP: 120/82  Pulse: 64  Temp: 97.7 F (36.5 C)    BP Readings from Last 3 Encounters:  02/15/17 120/82  12/15/16 (!) 150/86  11/09/16 128/72   Wt Readings from Last 3 Encounters:  02/15/17 157 lb 12.8 oz (71.6 kg)  12/15/16 155 lb 10.3 oz (70.6 kg)  11/09/16 151 lb 9.6 oz (68.8 kg)    Physical Exam  Constitutional: No distress.  Cardiovascular: Normal rate, regular rhythm and normal heart sounds.   Pulmonary/Chest: Effort normal and breath sounds normal. No respiratory distress. She has no wheezes. She has no rales.  Right lower anterior and lateral rib tenderness, no bony defects palpated, no flail chest noted, no overlying skin changes  Abdominal: Soft. Bowel sounds are normal. She exhibits no distension. There is no tenderness. There is no rebound and no guarding.  Musculoskeletal: She exhibits no edema.  Neurological: She is alert. Gait normal.  Skin: Skin is warm and dry. She is not diaphoretic.     Assessment/Plan: Please see individual problem list.  UTI (urinary tract infection) UA with positive blood in combination with her symptoms likely indicate UTI. We will  send her urine for culture and microscopy. We will treat with ciprofloxacin given her penicillin allergy that includes swelling of the mouth. Have to avoid Macrobid given her age and Bactrim given her hematologic issues. She's given return precautions.  Rib pain on right side Patient with onset of rib pain after bending over and then sitting up. Suspect intercostal muscle strain though given persistent discomfort and tenderness on exam today we'll obtain x-rays of her right ribs and lungs to ensure that there are no fractures. She does have good air movement and stable vital signs. Discussed monitoring. We'll contact her with the x-ray results. She is given return precautions.   Orders Placed This Encounter  Procedures  . Urine Culture  . DG Ribs Unilateral W/Chest Right    Standing Status:   Future    Number of Occurrences:   1    Standing Expiration Date:   04/15/2018    Order Specific Question:   Reason for Exam (SYMPTOM  OR DIAGNOSIS REQUIRED)    Answer:   right lower rib pain after bending over, minimal chronic cough, no dyspnea    Order Specific Question:   Preferred imaging location?    Answer:   [facility]  . Urine Microscopic Only  . POCT Urinalysis Dipstick    Meds ordered this encounter  Medications  . ciprofloxacin (CIPRO) 250 MG tablet    Sig: Take 1 tablet (250 mg total) by mouth 2 (two) times daily.    Dispense:  6  tablet    Refill:  0    Tommi Rumps, MD [place]

## 2017-02-15 NOTE — Telephone Encounter (Signed)
Called and spoke with patient regarding x-ray results. She has a anterior seventh rib fracture. No signs of pneumothorax. Discussed that pain control was the best treatment at this time. She reports she's been taking Tylenol with good benefit. I did discuss that she could take this 3 times a day. Advised her to take deep breaths when she is watching TV with every commercial break to try to keep her lungs expanded. Advised recheck in a couple of weeks and she reported she had an appointment with her PCP in early March.

## 2017-02-15 NOTE — Progress Notes (Signed)
Pre visit review using our clinic review tool, if applicable. No additional management support is needed unless otherwise documented below in the visit note. 

## 2017-02-16 NOTE — Telephone Encounter (Signed)
Patient should stay active though she should minimize any activities that cause her discomfort in her ribs.

## 2017-02-16 NOTE — Telephone Encounter (Signed)
Patient notified

## 2017-02-16 NOTE — Telephone Encounter (Signed)
Please advise 

## 2017-02-18 ENCOUNTER — Other Ambulatory Visit: Payer: Self-pay | Admitting: Family Medicine

## 2017-02-18 LAB — URINE CULTURE

## 2017-02-18 MED ORDER — NITROFURANTOIN MONOHYD MACRO 100 MG PO CAPS: 100.0000 mg | ORAL_CAPSULE | Freq: Two times a day (BID) | ORAL | 0 refills | Status: DC

## 2017-02-22 ENCOUNTER — Other Ambulatory Visit: Payer: Self-pay | Admitting: Internal Medicine

## 2017-02-25 ENCOUNTER — Telehealth: Payer: Self-pay | Admitting: *Deleted

## 2017-02-25 NOTE — Telephone Encounter (Signed)
Patient stated that she will have an appt on 03/04/16, she would like to receive her prolia vaccine while here. Pt stated that she is over due for this. Pt did not care to return for this vaccine after her visit on Thursday. She requested to have this the day of her visit if possible.  Pt contact [phone]

## 2017-02-26 NOTE — Telephone Encounter (Signed)
Patient notified we will have to attain authorization to attain prolia, due to patient missed October injection due to patient hospitalization.

## 2017-03-04 ENCOUNTER — Telehealth: Payer: Self-pay | Admitting: *Deleted

## 2017-03-04 ENCOUNTER — Ambulatory Visit (INDEPENDENT_AMBULATORY_CARE_PROVIDER_SITE_OTHER): Payer: Medicare Other | Admitting: Internal Medicine

## 2017-03-04 ENCOUNTER — Encounter: Payer: Self-pay | Admitting: Internal Medicine

## 2017-03-04 VITALS — BP 118/80 | HR 67 | Resp 16 | Wt 162.0 lb

## 2017-03-04 DIAGNOSIS — E7800 Pure hypercholesterolemia, unspecified: Secondary | ICD-10-CM

## 2017-03-04 DIAGNOSIS — R5383 Other fatigue: Secondary | ICD-10-CM | POA: Diagnosis not present

## 2017-03-04 DIAGNOSIS — E559 Vitamin D deficiency, unspecified: Secondary | ICD-10-CM

## 2017-03-04 DIAGNOSIS — M8000XD Age-related osteoporosis with current pathological fracture, unspecified site, subsequent encounter for fracture with routine healing: Secondary | ICD-10-CM | POA: Diagnosis not present

## 2017-03-04 DIAGNOSIS — C9110 Chronic lymphocytic leukemia of B-cell type not having achieved remission: Secondary | ICD-10-CM

## 2017-03-04 DIAGNOSIS — J9611 Chronic respiratory failure with hypoxia: Secondary | ICD-10-CM

## 2017-03-04 DIAGNOSIS — S2231XD Fracture of one rib, right side, subsequent encounter for fracture with routine healing: Secondary | ICD-10-CM

## 2017-03-04 DIAGNOSIS — Z9981 Dependence on supplemental oxygen: Secondary | ICD-10-CM

## 2017-03-04 DIAGNOSIS — E890 Postprocedural hypothyroidism: Secondary | ICD-10-CM | POA: Diagnosis not present

## 2017-03-04 DIAGNOSIS — Z1231 Encounter for screening mammogram for malignant neoplasm of breast: Secondary | ICD-10-CM | POA: Diagnosis not present

## 2017-03-04 LAB — COMPREHENSIVE METABOLIC PANEL
ALK PHOS: 53 U/L (ref 39–117)
ALT: 8 U/L (ref 0–35)
AST: 15 U/L (ref 0–37)
Albumin: 4.2 g/dL (ref 3.5–5.2)
BILIRUBIN TOTAL: 0.5 mg/dL (ref 0.2–1.2)
BUN: 12 mg/dL (ref 6–23)
CALCIUM: 9.6 mg/dL (ref 8.4–10.5)
CO2: 34 mEq/L — ABNORMAL HIGH (ref 19–32)
CREATININE: 0.67 mg/dL (ref 0.40–1.20)
Chloride: 98 mEq/L (ref 96–112)
GFR: 89.46 mL/min (ref >60.00)
GLUCOSE: 79 mg/dL (ref 70–99)
Potassium: 4.4 mEq/L (ref 3.5–5.1)
Sodium: 136 mEq/L (ref 135–145)
TOTAL PROTEIN: 6.7 g/dL (ref 6.0–8.3)

## 2017-03-04 LAB — LIPID PANEL
CHOLESTEROL: 215 mg/dL — AB (ref 0–200)
HDL: 43.4 mg/dL (ref >39.00)
LDL Cholesterol: 143 mg/dL — ABNORMAL HIGH (ref 0–99)
NONHDL: 171.88
TRIGLYCERIDES: 142 mg/dL (ref 0.0–149.0)
Total CHOL/HDL Ratio: 5
VLDL: 28.4 mg/dL (ref 0.0–40.0)

## 2017-03-04 LAB — CBC WITH DIFFERENTIAL/PLATELET
BASOS ABS: 0.1 10*3/uL (ref 0.0–0.1)
Basophils Relative: 0.3 % (ref 0.0–3.0)
EOS ABS: 0.2 10*3/uL (ref 0.0–0.7)
Eosinophils Relative: 0.5 % (ref 0.0–5.0)
HCT: 41.4 % (ref 36.0–46.0)
Hemoglobin: 13.3 g/dL (ref 12.0–15.0)
LYMPHS ABS: 23.8 10*3/uL — AB (ref 0.7–4.0)
MCHC: 32.1 g/dL (ref 30.0–36.0)
MCV: 86.5 fl (ref 78.0–100.0)
MONOS PCT: 2.2 % — AB (ref 3.0–12.0)
Monocytes Absolute: 0.7 10*3/uL (ref 0.1–1.0)
NEUTROS PCT: 17 % — AB (ref 43.0–77.0)
Neutro Abs: 5.1 10*3/uL (ref 1.4–7.7)
Platelets: 162 10*3/uL (ref 150.0–400.0)
RBC: 4.78 Mil/uL (ref 3.87–5.11)
RDW: 13.9 % (ref 11.5–15.5)
WBC: 29.8 10*3/uL (ref 4.0–10.5)

## 2017-03-04 LAB — VITAMIN D 25 HYDROXY (VIT D DEFICIENCY, FRACTURES): VITD: 42.64 ng/mL (ref 30.00–100.00)

## 2017-03-04 LAB — HM MAMMOGRAPHY

## 2017-03-04 NOTE — Telephone Encounter (Signed)
Patient needs authorization for Prolia she missed 10/17 dose due to patient hospitalized, we need to attain new authorization right.

## 2017-03-04 NOTE — Telephone Encounter (Signed)
FYI.  Pt has CLL.  Overall count relatively stable.  Juliann Pulse spoke to her  States doing ok.  Sees Dr Grayland Ormond.

## 2017-03-04 NOTE — Telephone Encounter (Signed)
Per Dr. Nicki Reaper called verify patient is feeling fine , no change since OV today she stated "this mean she can't have her 1 glass of wine?" Patient stated she feels fine and aware variation WBC count.

## 2017-03-04 NOTE — Progress Notes (Signed)
Pre visit review using our clinic review tool, if applicable. No additional management support is needed unless otherwise documented below in the visit note. 

## 2017-03-04 NOTE — Telephone Encounter (Signed)
Prolia authorization submitted electronically, thanks

## 2017-03-04 NOTE — Telephone Encounter (Signed)
Critical Lab:  WBC: 29.8  Tullo's patient (Provider out of office)

## 2017-03-04 NOTE — Patient Instructions (Addendum)
You need to wear your oxygen whenever you are moving or sleeping  (that means when you leave the house )   We are ordering your PROLIA for treat your osteoporosis  FOR YOUR CALCIUM NEEDS  YOU CAN TAKE OSCAL PLUS D  2 TABLETS DAILY

## 2017-03-04 NOTE — Progress Notes (Signed)
Subjective:  Patient ID: Lisa Figueroa, female    DOB: 1932/03/18  Age: 83 y.o. MRN: [id number]  CC: The primary encounter diagnosis was Postsurgical hypothyroidism. Diagnoses of Pure hypercholesterolemia, Vitamin D deficiency, CLL (chronic lymphocytic leukemia) ([place]), Fatigue, unspecified type, Closed fracture of one rib of right side with routine healing, subsequent encounter, Chronic respiratory failure with hypoxia, on home O2 therapy ([place]), and Age-related osteoporosis with current pathological fracture with routine healing, subsequent encounter were also pertinent to this visit.  HPI Lisa Figueroa presents for follow up on chronic issues includingg chronic respiraatory failure with hypoxia , hypothyroid, hypertension and hyperlipidemia  chronic resp failure:  She was referred to Pulmonology and for PFTS. Which revealed restrictive and obstructive airway disease.   Pulmonary rehab and Incruz inhaler were prescribed  But she has deferred pulmonary rehab and [facility] [employer] [facility] .  She meets criteria for supplemental 02 by ambulatory desaturations, but continues to underutilize the supplemental oxygen .  She is not wearing her 02 with exercise or activity .    She was recently diagnosed with a nontraumatic rib fracture on the right,  #7, which occurred  while bending over (xhest x ray  Feb 19th). She has documented osteoporosis, by March 2017 DEXA and Prolia was iniated but she has only had one injection .  She missed the dose that was due in October because she was in the hospital and refused it,  So  It was not ordered (no documentation)  She is taking Vitamin D3 2000 ius daily     Outpatient Medications Prior to Visit  Medication Sig Dispense Refill  . acetaminophen (TYLENOL) 500 MG tablet Take 500 mg by mouth daily.    Marland Kitchen amLODipine (NORVASC) 2.5 MG tablet Take 1 tablet (2.5 mg total) by mouth daily. 90 tablet 3  . Cholecalciferol (VITAMIN D) 2000 units  CAPS Take by mouth daily.    . feeding supplement, ENSURE ENLIVE, (ENSURE ENLIVE) LIQD Take 237 mLs by mouth 2 (two) times daily between meals. 237 mL 12  . gabapentin (NEURONTIN) 400 MG capsule Take 1 capsule (400 mg total) by mouth 2 (two) times daily. 270 capsule 0  . INCRUSE ELLIPTA 62.5 MCG/INH AEPB     . levothyroxine (SYNTHROID, LEVOTHROID) 150 MCG tablet TAKE 1 TABLET BY MOUTH EVERY DAY. 90 tablet 2  . losartan (COZAAR) 100 MG tablet Take 1 tablet (100 mg total) by mouth daily. 90 tablet 3  . OXYGEN Inhale 2 L into the lungs. 2 liters @ night    . nitrofurantoin, macrocrystal-monohydrate, (MACROBID) 100 MG capsule Take 1 capsule (100 mg total) by mouth 2 (two) times daily. 14 capsule 0  . mirtazapine (REMERON) 15 MG tablet Take 1 tablet (15 mg total) by mouth at bedtime. (Patient not taking: Reported on 03/04/2017) 30 tablet 0   No facility-administered medications prior to visit.     Review of Systems;  Patient denies headache, fevers, malaise, unintentional weight loss, skin rash, eye pain, sinus congestion and sinus pain, sore throat, dysphagia,  hemoptysis , cough, dyspnea, wheezing, chest pain, palpitations, orthopnea, edema, abdominal pain, nausea, melena, diarrhea, constipation, flank pain, dysuria, hematuria, urinary  Frequency, nocturia, numbness, tingling, seizures,  Focal weakness, Loss of consciousness,  Tremor, insomnia, depression, anxiety, and suicidal ideation.      Objective:  BP 118/80   Pulse 67   Resp 16   Wt 162 lb (73.5 kg)   SpO2 94%   BMI 29.63 kg/m  BP Readings from Last 3 Encounters:  03/04/17 118/80  02/15/17 120/82  12/15/16 (!) 150/86    Wt Readings from Last 3 Encounters:  03/04/17 162 lb (73.5 kg)  02/15/17 157 lb 12.8 oz (71.6 kg)  12/15/16 155 lb 10.3 oz (70.6 kg)    General appearance: alert, cooperative and appears stated age Ears: normal TM's and external ear canals both ears Throat: lips, mucosa, and tongue normal; teeth and gums  normal Neck: no adenopathy, no carotid bruit, supple, symmetrical, trachea midline and thyroid not enlarged, symmetric, no tenderness/mass/nodules Back: symmetric, no curvature. ROM normal. No CVA tenderness. Lungs: clear to auscultation bilaterally Heart: regular rate and rhythm, S1, S2 normal, no murmur, click, rub or gallop Abdomen: soft, non-tender; bowel sounds normal; no masses,  no organomegaly Pulses: 2+ and symmetric Skin: Skin color, texture, turgor normal. No rashes or lesions Lymph nodes: Cervical, supraclavicular, and axillary nodes normal.  Lab Results  Component Value Date   HGBA1C 5.2 05/14/2015    Lab Results  Component Value Date   CREATININE 0.67 03/04/2017   CREATININE 0.66 10/14/2016   CREATININE 0.70 10/07/2016    Lab Results  Component Value Date   WBC 29.8 Repeated and verified X2. (HH) 03/04/2017   HGB 13.3 03/04/2017   HCT 41.4 03/04/2017   PLT 162.0 03/04/2017   GLUCOSE 79 03/04/2017   CHOL 215 (H) 03/04/2017   TRIG 142.0 03/04/2017   HDL 43.40 03/04/2017   LDLCALC 143 (H) 03/04/2017   ALT 8 03/04/2017   AST 15 03/04/2017   NA 136 03/04/2017   K 4.4 03/04/2017   CL 98 03/04/2017   CREATININE 0.67 03/04/2017   BUN 12 03/04/2017   CO2 34 (H) 03/04/2017   TSH 0.546 03/04/2017   INR 1.0 06/27/2013   HGBA1C 5.2 05/14/2015   MICROALBUR 1.2 01/31/2016    Ct Angio Chest Pe W Or Wo Contrast  Result Date: 10/04/2016 CLINICAL DATA:  Worsening shortness of breath and weakness. History of chronic lymphocytic leukemia and thyroid cancer. EXAM: CT ANGIOGRAPHY CHEST WITH CONTRAST TECHNIQUE: Multidetector CT imaging of the chest was performed using the standard protocol during bolus administration of intravenous contrast. Multiplanar CT image reconstructions and MIPs were obtained to evaluate the vascular anatomy. CONTRAST:  75 mL Isovue 370 COMPARISON:  Chest radiographs 05/22/2016.  PET-CT 10/03/2014. FINDINGS: Cardiovascular: Pulmonary arterial  opacification is adequate, however respiratory motion artifact limits assessment of the subsegmental and some segmental branches, particularly in the lower lobes. No definite pulmonary emboli are identified within this limitation. Mild ascending aortic ectasia has not significantly changed from the prior PET-CT, measuring 4.3 cm (previously 4.2 cm). There is extensive thoracic aortic atherosclerosis. Coronary artery atherosclerosis is also noted. No pericardial effusion. Mediastinum/Nodes: Subpectoral and axillary lymphadenopathy is again seen. Right axillary lymph nodes measure up to 11 mm in short axis, similar to the prior PET-CT. Left axillary lymph nodes have decreased in size, currently measuring up to 14 mm in short axis (previously 29 mm). Mediastinal and hilar lymph nodes have mildly enlarged and measure up to 1 cm in short axis. Lungs/Pleura: No pleural effusion or pneumothorax. Evaluation of the lung parenchyma is mildly limited by respiratory motion artifact. There are patchy areas of ground-glass opacity bilaterally, involving the upper lobes greater than lower lobes. Underlying centrilobular emphysema is again noted. No lung mass. Upper Abdomen: Partially visualized splenomegaly. Musculoskeletal: No suspicious lytic or blastic osseous lesion. Small, scattered Schmorl's nodes in the spine. Review of the MIP images confirms the above findings.  IMPRESSION: 1. Motion degraded examination. No proximal segmental or more central pulmonary emboli identified. 2. Axillary and subpectoral lymphadenopathy consistent with history of leukemia. Left axillary lymphadenopathy has improved from 2015. 3. Mild mediastinal and bilateral hilar lymphadenopathy. 4. Ground-glass opacities in both lungs. The differential diagnosis is broad and includes pneumonia (including viral and opportunistic infection) as well as noninfectious inflammatory causes. 5. Aortic atherosclerosis. Electronically Signed   By: Logan Bores M.D.    On: 10/04/2016 20:36    Assessment & Plan:   Problem List Items Addressed This Visit    Chronic respiratory failure with hypoxia, on home O2 therapy [facility])    She remains 02 dependent since her hospitalization in early October for pneumonia.  She has a portable 02 tank as she has demonstrated ambulatory desaturations today.   . Exam and PFTS support COPD with superimposed fibrosis. Advised to use the portable 02 tank with all activity       CLL (chronic lymphocytic leukemia) ([facility])    Surveillance with CBC done,  No changes to her chronic leukocytosis seen.       Relevant Orders   CBC with Differential/Platelet (Completed)   Hyperlipidemia    Her risk of CAD is elevated based on today's fasting lipids but she has emphatically refused treatment (see telephone note)  Lab Results  Component Value Date   CHOL 215 (H) 03/04/2017   HDL 43.40 03/04/2017   LDLCALC 143 (H) 03/04/2017   TRIG 142.0 03/04/2017   CHOLHDL 5 03/04/2017         Relevant Orders   Lipid panel (Completed)   Osteoporosis    History of pelvic and rib fractures.  DEXA 2017.  Resume Prolia      Postsurgical hypothyroidism - Primary   Relevant Orders   T4 AND TSH (Completed)   Rib fracture    Nontraumatic, nondisplaced.   Right #7 secondary to osteoporosis.  Feb 2018. Now asymptomatic.  Osteoporosis addressed.        Other Visit Diagnoses    Vitamin D deficiency       Relevant Orders   VITAMIN D 25 Hydroxy (Vit-D Deficiency, Fractures) (Completed)   Fatigue, unspecified type       Relevant Orders   Comprehensive metabolic panel (Completed)     A total of 25 minutes of face to face time was spent with patient more than half of which was spent in counselling about the above mentioned conditions  and coordination of care   I have discontinued Ms. Gilbo's mirtazapine and nitrofurantoin (macrocrystal-monohydrate). I am also having her maintain her acetaminophen, losartan, gabapentin, Vitamin D,  amLODipine, feeding supplement (ENSURE ENLIVE), INCRUSE ELLIPTA, OXYGEN, and levothyroxine.  No orders of the defined types were placed in this encounter.   Medications Discontinued During This Encounter  Medication Reason  . mirtazapine (REMERON) 15 MG tablet Patient has not taken in last 30 days  . nitrofurantoin, macrocrystal-monohydrate, (MACROBID) 100 MG capsule Completed Course    Follow-up: No Follow-up on file.   Crecencio Mc, MD

## 2017-03-05 LAB — T4 AND TSH
T4, Total: 8.4 ug/dL (ref 4.5–12.0)
TSH: 0.546 u[IU]/mL (ref 0.450–4.500)

## 2017-03-07 NOTE — Assessment & Plan Note (Signed)
Nontraumatic, nondisplaced.   Right #7 secondary to osteoporosis.  Feb 2018. Now asymptomatic.  Osteoporosis addressed.

## 2017-03-07 NOTE — Assessment & Plan Note (Signed)
Surveillance with CBC done,  No changes to her chronic leukocytosis seen.

## 2017-03-07 NOTE — Assessment & Plan Note (Signed)
Her risk of CAD is elevated based on today's fasting lipids but she has emphatically refused treatment (see telephone note)  Lab Results  Component Value Date   CHOL 215 (H) 03/04/2017   HDL 43.40 03/04/2017   LDLCALC 143 (H) 03/04/2017   TRIG 142.0 03/04/2017   CHOLHDL 5 03/04/2017

## 2017-03-07 NOTE — Assessment & Plan Note (Signed)
She remains 02 dependent since her hospitalization in early October for pneumonia.  She has a portable 02 tank as she has demonstrated ambulatory desaturations today.   . Exam and PFTS support COPD with superimposed fibrosis. Advised to use the portable 02 tank with all activity

## 2017-03-07 NOTE — Assessment & Plan Note (Signed)
History of pelvic and rib fractures.  DEXA 2017.  Resume Prolia

## 2017-03-08 ENCOUNTER — Ambulatory Visit: Payer: Medicare Other | Admitting: Internal Medicine

## 2017-03-15 ENCOUNTER — Telehealth: Payer: Self-pay | Admitting: Internal Medicine

## 2017-03-15 NOTE — Telephone Encounter (Signed)
Pt states the inhaler is too expensive for her and [employer] is trying to lower the tier. She states [employer] has sent paperwork to our office to have the tier lowered. Please call.

## 2017-03-17 NOTE — Telephone Encounter (Signed)
Called pt because we haven't received any paper work from [facility]. Pt gave information needed to initiate a tier exception.    Pt ID: [id number] BIN: [id number] PCN: [id number] Grp: [id number]   Tier exception initiated thru CMM for Incruse. Will await response.

## 2017-03-23 DIAGNOSIS — C44519 Basal cell carcinoma of skin of other part of trunk: Secondary | ICD-10-CM | POA: Diagnosis not present

## 2017-03-23 DIAGNOSIS — L905 Scar conditions and fibrosis of skin: Secondary | ICD-10-CM | POA: Diagnosis not present

## 2017-03-25 NOTE — Telephone Encounter (Signed)
Called insurance company to f/u on tier exception and they state they have nothing and that it will have to be resubmitted. Resubmitting thru CMM.  Key: [id number]

## 2017-03-29 NOTE — Telephone Encounter (Signed)
Still pending in CMM. Will check again later.

## 2017-03-31 ENCOUNTER — Telehealth: Payer: Self-pay

## 2017-03-31 NOTE — Telephone Encounter (Signed)
Pt called and is scheduled for 4/6 @ 10 am.

## 2017-03-31 NOTE — Telephone Encounter (Signed)
See new phone note, thanks

## 2017-03-31 NOTE — Telephone Encounter (Signed)
Attempted to reach patient to discuss Prolia injection, left a VM to return my call

## 2017-03-31 NOTE — Telephone Encounter (Signed)
Had to renew request. New key is [id number].  Next Steps  The plan will fax you a determination, typically within 1 to 5 business days.

## 2017-04-02 ENCOUNTER — Ambulatory Visit (INDEPENDENT_AMBULATORY_CARE_PROVIDER_SITE_OTHER): Payer: Medicare Other | Admitting: *Deleted

## 2017-04-02 DIAGNOSIS — M818 Other osteoporosis without current pathological fracture: Secondary | ICD-10-CM

## 2017-04-02 MED ORDER — DENOSUMAB 60 MG/ML ~~LOC~~ SOLN
60.0000 mg | Freq: Once | SUBCUTANEOUS | Status: AC
  Administered 2017-04-02: 60 mg via SUBCUTANEOUS

## 2017-04-02 NOTE — Progress Notes (Signed)
Patient presented for Prolia injection given Sub Q into left Arm, patient voiced no concerns or showed any sign of distress during injection.

## 2017-04-02 NOTE — Telephone Encounter (Signed)
Checked CMM. No response at this time.

## 2017-04-02 NOTE — Telephone Encounter (Signed)
Injection ordered, thanks

## 2017-04-06 NOTE — Telephone Encounter (Signed)
Sent message thru [facility] for an outcome. Will watch for response.

## 2017-04-08 ENCOUNTER — Other Ambulatory Visit: Payer: Self-pay | Admitting: Internal Medicine

## 2017-04-08 NOTE — Telephone Encounter (Signed)
Tier exception denied. Pharmacy informed. Nothing further needed.

## 2017-04-10 NOTE — Progress Notes (Signed)
  I have reviewed the above information and agree with above.   Lavon Bothwell, MD 

## 2017-04-13 NOTE — Progress Notes (Signed)
[facility] Consultation      Assessment and Plan:  COPD -Changes of emphysema noted on CT, as well as Pulmonary function testing. -Chronic hypoxic respiratory failure, currently on oxygen. -Mild dyspnea, at last visit was doing better on incruz, will continue.  --Will check nocturnal sat gram.   Interstitial Lung disease.  --Appears to have underlying ILD, suspect some degree of pulmonary fibrosis.  -Groundglass changes seen on CT chest, doubt IPF, likely NSIP. No intervention at this time.  Debility/deconditioning.  -I suspect that this is the greatest modifiable  driver of her dyspnea, she has chronic fatigue and is very sedentary. She has been going to exercise classes with her sister.  -Continue physical activity;  encouraged increase activity level.  Date: 04/13/2017  MRN# [id number] Lisa Figueroa 07/22/1932   Lisa Figueroa is a 83 y.o. old female seen in consultation for chief complaint of:    Chief Complaint  Patient presents with  . Follow-up    20mo rov- pt states breathing is doing well. pt reports of occ non prod cough. no new concerns today.     HPI:   Lisa Figueroa  is an 83 year old female with history of CLL. At last visit it was noted that she had dyspnea on exertion, due to COPD, mild pulmonary fibrosis, and deconditioning.   Since her last visit she has been going with her sister/caregiver Peggy, who is also present today and gives some of the the history, to [facility] for some exercise class 2 days per week.  She is on oxygen at 2L at night ("Joe") at night. Joe is in the car, but she does not use it; she routinely checks her oxygen and it is always above 90%. She is taking incruz once per day, she is concerned because it costs $45 per month.   Images personally reviewed from chest x-ray 10/18, CT chest 10/04/16: Diffuse interstitial changes, some emphysematous changes, mild groundglass changes in the upper lobes.  Review of PFT 11/03/16:  FVC was 56% predicted, FEV1 50% of predicted, ratio was 56%. DLCO was 42% of predicted. Appears consistent with both restrictive and obstructive lung disease.  Desat walk: sat dropped after 300 feet to 87%.   Medication:   Reviewed.     Allergies:  Penicillin g and Prednisone  Review of Systems: Gen:  Denies  fever, sweats, chills HEENT: Denies blurred vision, double vision. bleeds, sore throat Cvc:  No dizziness, chest pain. Resp:   Denies cough or sputum production, shortness of breath Gi: Denies swallowing difficulty, stomach pain. Gu:  Denies bladder incontinence, burning urine Ext:   No Joint pain, stiffness. Skin: No skin rash,  hives  Endoc:  No polyuria, polydipsia. Psych: No depression, insomnia. Other:  All other systems were reviewed with the patient and were negative other that what is mentioned in the HPI.   Physical Examination:   VS: BP 130/72 (BP Location: Left Arm, Cuff Size: Normal)   Pulse 71   Ht 5\' 2"  (1.575 m)   Wt 165 lb 3.2 oz (74.9 kg)   SpO2 92%   BMI 30.22 kg/m   General Appearance: No distress  Neuro:without focal findings,  speech normal,  HEENT: PERRLA, EOM intact.   Pulmonary: normal breath sounds, Decreased air entry bilaterally. Basal bilateral mild crackles.  CardiovascularNormal S1,S2.  No m/r/g.   Abdomen: Benign, Soft, non-tender. Renal:  No costovertebral tenderness  GU:  No performed at this time. Endoc: No evident thyromegaly, no signs of  acromegaly. Skin:   warm, no rashes, no ecchymosis  Extremities: normal, no cyanosis, clubbing.  Other findings:    LABORATORY PANEL:   CBC No results for input(s): WBC, HGB, HCT, PLT in the last 168 hours. ------------------------------------------------------------------------------------------------------------------  Chemistries  No results for input(s): NA, K, CL, CO2, GLUCOSE, BUN, CREATININE, CALCIUM, MG, AST, ALT, ALKPHOS, BILITOT in the last 168 hours.  Invalid input(s):  GFRCGP ------------------------------------------------------------------------------------------------------------------  Cardiac Enzymes No results for input(s): TROPONINI in the last 168 hours. ------------------------------------------------------------  RADIOLOGY:  No results found.     Thank  you for the consultation and for allowing [facility] to assist in the care of your patient. Our recommendations are noted above.  Please contact us if we can be of further service.   Marda Stalker, MD.  Board Certified in Internal Medicine, Pulmonary Medicine, [place], and Sleep Medicine.  [facility] Office Number: [phone]  Patricia Pesa, M.D.  Merton Border, M.D  04/13/2017

## 2017-04-15 ENCOUNTER — Encounter: Payer: Self-pay | Admitting: Internal Medicine

## 2017-04-15 ENCOUNTER — Ambulatory Visit (INDEPENDENT_AMBULATORY_CARE_PROVIDER_SITE_OTHER): Payer: Medicare Other | Admitting: Internal Medicine

## 2017-04-15 VITALS — BP 130/72 | HR 71 | Ht 62.0 in | Wt 165.2 lb

## 2017-04-15 DIAGNOSIS — J438 Other emphysema: Secondary | ICD-10-CM | POA: Diagnosis not present

## 2017-04-15 DIAGNOSIS — H2511 Age-related nuclear cataract, right eye: Secondary | ICD-10-CM | POA: Diagnosis not present

## 2017-04-15 DIAGNOSIS — J8410 Pulmonary fibrosis, unspecified: Secondary | ICD-10-CM

## 2017-04-15 NOTE — Addendum Note (Signed)
Addended by: Maryanna Shape A on: 04/15/2017 08:56 AM   Modules accepted: Orders

## 2017-04-15 NOTE — Patient Instructions (Addendum)
Continue physical activity   Incruz once daily.   Will check nocturnal oxymetry on room air. If this is normal we can discontinue your nocturnal oxyten.

## 2017-04-28 DIAGNOSIS — J9621 Acute and chronic respiratory failure with hypoxia: Secondary | ICD-10-CM | POA: Diagnosis not present

## 2017-04-29 ENCOUNTER — Encounter: Payer: Self-pay | Admitting: Internal Medicine

## 2017-04-29 DIAGNOSIS — J9621 Acute and chronic respiratory failure with hypoxia: Secondary | ICD-10-CM | POA: Diagnosis not present

## 2017-04-29 DIAGNOSIS — J438 Other emphysema: Secondary | ICD-10-CM

## 2017-04-29 DIAGNOSIS — J8410 Pulmonary fibrosis, unspecified: Secondary | ICD-10-CM

## 2017-05-05 ENCOUNTER — Ambulatory Visit (INDEPENDENT_AMBULATORY_CARE_PROVIDER_SITE_OTHER): Payer: Medicare Other

## 2017-05-05 VITALS — BP 112/70 | HR 62 | Temp 98.0°F | Resp 14 | Ht 61.0 in | Wt 163.4 lb

## 2017-05-05 DIAGNOSIS — Z0000 Encounter for general adult medical examination without abnormal findings: Secondary | ICD-10-CM

## 2017-05-05 DIAGNOSIS — Z1389 Encounter for screening for other disorder: Secondary | ICD-10-CM

## 2017-05-05 NOTE — Patient Instructions (Addendum)
  Lisa Figueroa , Thank you for taking time to come for your Medicare Wellness Visit. I appreciate your ongoing commitment to your health goals. Please review the following plan we discussed and let me know if I can assist you in the future.   Follow up with Dr. Derrel Nip as needed.    Bring a copy of your Living Will to be scanned into chart.  Have a great day!  These are the goals we discussed: Goals    . Healthy Lifestyle          Stay active and continue exercise regimen Stay hydrated and drink plenty of water Low carb foods       This is a list of the screening recommended for you and due dates:  Health Maintenance  Topic Date Due  . Flu Shot  07/28/2017  . Tetanus Vaccine  01/30/2023  . DEXA scan (bone density measurement)  Completed  . Pneumonia vaccines  Completed

## 2017-05-05 NOTE — Progress Notes (Signed)
Subjective:   Lisa Figueroa is a 83 y.o. female who presents for Medicare Annual (Subsequent) preventive examination.  Review of Systems:  No ROS.  Medicare Wellness Visit.  Cardiac Risk Factors include: advanced age (>42men, >8 women);obesity (BMI >30kg/m2);hypertension     Objective:     Vitals: BP 112/70 (BP Location: Left Arm, Patient Position: Sitting, Cuff Size: Normal)   Pulse 62   Temp 98 F (36.7 C) (Oral)   Resp 14   Ht 5\' 1"  (1.549 m)   Wt 163 lb 6.4 oz (74.1 kg)   SpO2 96%   BMI 30.87 kg/m   Body mass index is 30.87 kg/m.   Tobacco History  Smoking Status  . Former Smoker  . Packs/day: 1.00  . Years: 20.00  . Quit date: 08/08/1984  Smokeless Tobacco  . Never Used     Counseling given: Not Answered   Past Medical History:  Diagnosis Date  . CLL (chronic lymphocytic leukemia) ([place])   . History of colon polyps   . History of shingles   . Hyperlipidemia   . Hypertension   . Post herpetic neuralgia   . Shingles   . Thyroid cancer ([place]) 1998  . Thyroid cancer [facility])    Past Surgical History:  Procedure Laterality Date  . ABDOMINAL HYSTERECTOMY    . APPENDECTOMY    . BREAST SURGERY Right    cyst excision  . CARDIAC CATHETERIZATION  05/18/2008   At [facility]: mild 30% stenosis in LCX. normal EF.    Marland Kitchen CATARACT EXTRACTION W/PHACO Left 06/04/2015   Procedure: CATARACT EXTRACTION PHACO AND INTRAOCULAR LENS PLACEMENT (IOC);  Surgeon: Birder Robson, MD;  Location: [facility] ORS;  Service: Ophthalmology;  Laterality: Left;  [place] 00:34.1 AP% 23.0 CDE 7.85  . CHOLECYSTECTOMY    . HEMORRHOID SURGERY    . TOTAL THYROIDECTOMY  1998   Family History  Problem Relation Age of Onset  . Cancer Brother     prostate and bone  . Heart disease Mother   . Heart disease Father   . Cancer Maternal Aunt     breast  . Cancer Cousin     breast - maternal side   History  Sexual Activity  . Sexual activity: No    Outpatient Encounter Prescriptions as of 05/05/2017    Medication Sig  . acetaminophen (TYLENOL) 500 MG tablet Take 500 mg by mouth daily.  Marland Kitchen amLODipine (NORVASC) 2.5 MG tablet Take 1 tablet (2.5 mg total) by mouth daily.  . Cholecalciferol (VITAMIN D) 2000 units CAPS Take by mouth daily.  . feeding supplement, ENSURE ENLIVE, (ENSURE ENLIVE) LIQD Take 237 mLs by mouth 2 (two) times daily between meals.  . gabapentin (NEURONTIN) 400 MG capsule TAKE ONE CAPSULE BY MOUTH TWICE A DAY  . INCRUSE ELLIPTA 62.5 MCG/INH AEPB   . levothyroxine (SYNTHROID, LEVOTHROID) 150 MCG tablet TAKE 1 TABLET BY MOUTH EVERY DAY.  Marland Kitchen losartan (COZAAR) 100 MG tablet Take 1 tablet (100 mg total) by mouth daily.  . OXYGEN Inhale 2 L into the lungs. 2 liters @ night   No facility-administered encounter medications on file as of 05/05/2017.     Activities of Daily Living In your present state of health, do you have any difficulty performing the following activities: 05/05/2017 10/04/2016  Hearing? Tempie Donning  Vision? N N  Difficulty concentrating or making decisions? N N  Walking or climbing stairs? Y Y  Dressing or bathing? N N  Doing errands, shopping? Aggie Moats  Conservation officer, nature  and eating ? N -  Using the Toilet? N -  In the past six months, have you accidently leaked urine? N -  Do you have problems with loss of bowel control? N -  Managing your Medications? N -  Managing your Finances? N -  Housekeeping or managing your Housekeeping? Y -  Some recent data might be hidden    Patient Care Team: Crecencio Mc, MD as PCP - General (Internal Medicine) [name], MD as Consulting Physician (General Surgery)    Assessment:    This is a routine wellness examination for Eliska. The goal of the wellness visit is to assist the patient how to close the gaps in care and create a preventative care plan for the patient.   Taking calcium VIT D as appropriate/Osteoporosis reviewed.  Medications reviewed; taking without issues or barriers.  Safety issues reviewed; smoke  detectors in the home. No firearms in the home.  Wears seatbelts when driving or riding with others. Patient does wear sunscreen or protective clothing when in direct sunlight. No violence in the home.  Depression- PHQ 2 &9 complete.  No signs/symptoms or verbal communication regarding little pleasure in doing things, feeling down, depressed or hopeless. No changes in sleeping, energy, eating, concentrating.  No thoughts of self harm or harm towards others.  Time spent on this topic is 8 minutes.   Patient is alert, normal appearance, oriented to person/place/and time. Correctly identified the president of the [place], recall of 3/3 words, and performing simple calculations.  Patient displays appropriate judgement and can read correct time from watch face.  No new identified risk were noted.  No failures at ADL's or IADL's.   BMI- discussed the importance of a healthy diet, water intake and exercise. Educational material provided.   24 hour diet recall: Breakfast: Sausage , egg Lunch: Chicken sandwich Dinner: Nabs Daily fluid intake: 2-3 cups of caffeine, 3 cups of water  HTN- followed by PCP.  Dental- every nine months.  [place]- Visual acuity not assessed per patient preference since they have regular follow up with the ophthalmologist.  Wears corrective lenses.  Sleep patterns- Sleeps 6 hours at night.  Wakes feeling rested. Oxygen in use 2L at night.  Naps as needed.  She does not wear her O2 during the day.  Health maintenance gaps- closed.  Patient Concerns: None at this time. Follow up with PCP as needed.  Exercise Activities and Dietary recommendations Current Exercise Habits: Structured exercise class, Type of exercise: calisthenics;stretching;strength training/weights;exercise ball, Time (Minutes): 60, Frequency (Times/Week): 2, Weekly Exercise (Minutes/Week): 120, Intensity: Mild  Goals    . Healthy Lifestyle          Stay active and continue exercise  regimen Stay hydrated and drink plenty of water Low carb foods      Fall Risk Fall Risk  05/05/2017 02/15/2017 06/20/2015 05/08/2015  Falls in the past year? No No - Yes  Number falls in past yr: - - - 1  Injury with Fall? - - Yes Yes  Risk Factor Category  - - - High Fall Risk  Risk for fall due to : - - History of fall(s) History of fall(s);Impaired mobility   Depression Screen PHQ 2/9 Scores 05/05/2017 02/15/2017 06/20/2015 05/08/2015  PHQ - 2 Score 0 0 0 0  PHQ- 9 Score 0 - - -     Cognitive Function MMSE - Mini Mental State Exam 05/05/2017  Orientation to time 5  Orientation to Place  5  Registration 3  Attention/ Calculation 5  Recall 3  Language- name 2 objects 2  Language- repeat 1  Language- follow 3 step command 3  Language- read & follow direction 1  Write a sentence 1  Copy design 1  Total score 30        Immunization History  Administered Date(s) Administered  . Influenza Split 09/27/2014  . Influenza, High Dose Seasonal PF 10/03/2015, 09/02/2016  . Pneumococcal Conjugate-13 08/08/2014  . Pneumococcal Polysaccharide-23 01/31/2016  . Td 01/30/2013   Screening Tests Health Maintenance  Topic Date Due  . INFLUENZA VACCINE  07/28/2017  . TETANUS/TDAP  01/30/2023  . DEXA SCAN  Completed  . PNA vac Low Risk Adult  Completed      Plan:    End of life planning; Advance aging; Advanced directives discussed. Copy of current HCPOA/Living Will requested.    I have personally reviewed and noted the following in the patient's chart:   . Medical and social history . Use of alcohol, tobacco or illicit drugs  . Current medications and supplements . Functional ability and status . Nutritional status . Physical activity . Advanced directives . List of other physicians . Hospitalizations, surgeries, and ER visits in previous 12 months . Vitals . Screenings to include cognitive, depression, and falls . Referrals and appointments  In addition, I have reviewed and  discussed with patient certain preventive protocols, quality metrics, and best practice recommendations. A written personalized care plan for preventive services as well as general preventive health recommendations were provided to patient.     Varney Biles, LPN  06/29/2201

## 2017-05-06 NOTE — Progress Notes (Signed)
  I have reviewed the above information and agree with above.   Haaris Metallo, MD 

## 2017-05-13 ENCOUNTER — Telehealth: Payer: Self-pay | Admitting: Internal Medicine

## 2017-05-13 NOTE — Telephone Encounter (Signed)
Pt informed she needs to continue her O2 at 2L [facility]. Nothing further needed.

## 2017-05-13 NOTE — Telephone Encounter (Signed)
Called [facility] due to not receiving the results of pt's ONO. Graham states pt had ONO done 04/29/17 and that they would fax them to me at  [phone].    Spoke with pt and informed her that I called [facility] in regards to her results and that once DR looks at them I will give her a call back with them. Pt verbalized understanding. Nothing further needed at this time.

## 2017-05-13 NOTE — Telephone Encounter (Signed)
Pt states she has not heard from her oxygen test that was done. Please call.

## 2017-06-12 NOTE — Progress Notes (Signed)
[facility]  [phone]  Fax:([phone]) [place] DOB: 1932/03/15  MR#: [id number]  LYY#:[id number]  Patient Care Team: Crecencio Mc, MD as PCP - General (Internal Medicine) [name], MD as Consulting Physician (General Surgery)  CHIEF COMPLAINT: CLL  INTERVAL HISTORY:  Patient returns to clinic today for repeat laboratory work and further evaluation. She currently feels well and is asymptomatic. She does not complain of weakness or fatigue today. She also now requires the use of oxygen, but only at night. She denies any fevers, chills, night sweats, or weight loss. She has no neurologic complaints. She denies any chest pain, cough, or hemoptysis. Patient denies any nausea, vomiting, constipation, or diarrhea. She has no urinary complaints. Patient otherwise feels well and offers no further specific complaints.   REVIEW OF SYSTEMS:   Review of Systems  Constitutional: Negative for chills, fever, malaise/fatigue and weight loss.  Respiratory: Positive for shortness of breath. Negative for cough and sputum production.   Cardiovascular: Negative.  Negative for chest pain and leg swelling.  Gastrointestinal: Negative.  Negative for abdominal pain, nausea and vomiting.  Genitourinary: Negative.   Musculoskeletal: Negative.   Neurological: Negative.  Negative for sensory change and weakness.  Psychiatric/Behavioral: The patient has insomnia. The patient is not nervous/anxious.     As per HPI. Otherwise, a complete review of systems is negative.  ONCOLOGY HISTORY: Oncology History    1. Monoclonal population of lymphocytes.  Lymphoproliferative disease. 83 year old lady who had an abnormal mammogram mid axillary lymphadenopathy core biopsies which he suggested a lymphoproliferative disease positive staining for CD5 and CD20.     CLL (chronic lymphocytic leukemia) ([place]) (Resolved)   11/18/2014 Initial Diagnosis    CLL (chronic  lymphocytic leukemia)       PAST MEDICAL HISTORY: Past Medical History:  Diagnosis Date  . CLL (chronic lymphocytic leukemia) ([place])   . History of colon polyps   . History of shingles   . Hyperlipidemia   . Hypertension   . Post herpetic neuralgia   . Shingles   . Thyroid cancer ([place]) 1998  . Thyroid cancer ([place])     PAST SURGICAL HISTORY: Past Surgical History:  Procedure Laterality Date  . ABDOMINAL HYSTERECTOMY    . APPENDECTOMY    . BREAST SURGERY Right    cyst excision  . CARDIAC CATHETERIZATION  05/18/2008   [facility]: mild 30% stenosis in LCX. normal EF.    Marland Kitchen CATARACT EXTRACTION W/PHACO Left 06/04/2015   Procedure: CATARACT EXTRACTION PHACO AND INTRAOCULAR LENS PLACEMENT (IOC);  Surgeon: Birder Robson, MD;  Location: [facility] ORS;  Service: Ophthalmology;  Laterality: Left;  [place] 00:34.1 AP% 23.0 CDE 7.85  . CHOLECYSTECTOMY    . HEMORRHOID SURGERY    . TOTAL THYROIDECTOMY  1998    FAMILY HISTORY Family History  Problem Relation Age of Onset  . Cancer Brother        prostate and bone  . Heart disease Mother   . Heart disease Father   . Cancer Maternal Aunt        breast  . Cancer Cousin        breast - maternal side    GYNECOLOGIC HISTORY:  No LMP recorded. Patient has had a hysterectomy.     ADVANCED DIRECTIVES:    HEALTH MAINTENANCE: Social History  Substance Use Topics  . Smoking status: Former Smoker    Packs/day: 1.00    Years: 20.00    Quit date: 08/08/1984  .  Smokeless tobacco: Never Used  . Alcohol use 0.0 oz/week     Comment: wine glass daily     Colonoscopy:  PAP:  Bone density:March 2017  Mammogram:May 2016  Allergies  Allergen Reactions  . Penicillin G Swelling  . Prednisone Other (See Comments)    crys and can't sleep crys and can't sleep    Current Outpatient Prescriptions  Medication Sig Dispense Refill  . acetaminophen (TYLENOL) 500 MG tablet Take 500 mg by mouth daily.    Marland Kitchen amLODipine (NORVASC) 2.5 MG tablet Take 1  tablet (2.5 mg total) by mouth daily. 90 tablet 3  . Cholecalciferol (VITAMIN D) 2000 units CAPS Take by mouth daily.    . feeding supplement, ENSURE ENLIVE, (ENSURE ENLIVE) LIQD Take 237 mLs by mouth 2 (two) times daily between meals. 237 mL 12  . gabapentin (NEURONTIN) 400 MG capsule TAKE ONE CAPSULE BY MOUTH TWICE A DAY 270 capsule 2  . INCRUSE ELLIPTA 62.5 MCG/INH AEPB     . levothyroxine (SYNTHROID, LEVOTHROID) 150 MCG tablet TAKE 1 TABLET BY MOUTH EVERY DAY. 90 tablet 2  . losartan (COZAAR) 100 MG tablet Take 1 tablet (100 mg total) by mouth daily. 90 tablet 3  . OXYGEN Inhale 2 L into the lungs. 2 liters @ night     No current facility-administered medications for this visit.     OBJECTIVE: BP 95/65 (BP Location: Left Arm, Patient Position: Sitting)   Pulse 69   Temp 97.2 F (36.2 C) (Tympanic)   Resp 18   Ht _0  (1.549 m)   Wt 161 lb 7 oz (73.2 kg)   SpO2 98%   BMI 30.50 kg/m    Body mass index is 30.5 kg/m.    ECOG FS:1 - Symptomatic but completely ambulatory  General: Well-developed, well-nourished, no acute distress. Eyes: Pink conjunctiva, anicteric sclera. Lungs: Clear to auscultation bilaterally. Heart: Regular rate and rhythm. No rubs, murmurs, or gallops. Abdomen: Soft, nontender, nondistended. No organomegaly noted, normoactive bowel sounds. Musculoskeletal: No edema, cyanosis, or clubbing. Neuro: Alert, answering all questions appropriately. Cranial nerves grossly intact. Skin: No rashes or petechiae noted. Psych: Normal affect. Lymphatics: Minimally palpable right axillary lymph node. Left axillary lymph node difficult to palpate. No other palpable lymphadenopathy.  LAB RESULTS:  Clinical Support on 06/14/2017  Component Date Value Ref Range Status  . WBC 06/14/2017 35.7* 3.6 - 11.0 K/uL Final  . RBC 06/14/2017 4.81  3.80 - 5.20 MIL/uL Final  . Hemoglobin 06/14/2017 13.2  12.0 - 16.0 g/dL Final  . HCT 06/14/2017 40.3  35.0 - 47.0 % Final  . MCV  06/14/2017 83.9  80.0 - 100.0 fL Final  . MCH 06/14/2017 27.5  26.0 - 34.0 pg Final  . MCHC 06/14/2017 32.7  32.0 - 36.0 g/dL Final  . RDW 06/14/2017 13.9  11.5 - 14.5 % Final  . Platelets 06/14/2017 162  150 - 440 K/uL Final  . Neutrophils Relative % 06/14/2017 13  % Final  . Neutro Abs 06/14/2017 4.8  1.4 - 6.5 K/uL Final  . Lymphocytes Relative 06/14/2017 83  % Final  . Lymphs Abs 06/14/2017 29.5* 1.0 - 3.6 K/uL Final  . Monocytes Relative 06/14/2017 3  % Final  . Monocytes Absolute 06/14/2017 0.9  0.2 - 0.9 K/uL Final  . Eosinophils Relative 06/14/2017 0  % Final  . Eosinophils Absolute 06/14/2017 0.1  0 - 0.7 K/uL Final  . Basophils Relative 06/14/2017 1  % Final  . Basophils Absolute 06/14/2017 0.4* 0 -  0.1 K/uL Final  . Sodium 06/14/2017 132* 135 - 145 mmol/L Final  . Potassium 06/14/2017 4.6  3.5 - 5.1 mmol/L Final  . Chloride 06/14/2017 95* 101 - 111 mmol/L Final  . CO2 06/14/2017 30  22 - 32 mmol/L Final  . Glucose, Bld 06/14/2017 79  65 - 99 mg/dL Final  . BUN 06/14/2017 18  6 - 20 mg/dL Final  . Creatinine, Ser 06/14/2017 0.97  0.44 - 1.00 mg/dL Final  . Calcium 06/14/2017 9.1  8.9 - 10.3 mg/dL Final  . Total Protein 06/14/2017 7.0  6.5 - 8.1 g/dL Final  . Albumin 06/14/2017 4.2  3.5 - 5.0 g/dL Final  . AST 06/14/2017 19  15 - 41 U/L Final  . ALT 06/14/2017 12* 14 - 54 U/L Final  . Alkaline Phosphatase 06/14/2017 41  38 - 126 U/L Final  . Total Bilirubin 06/14/2017 0.6  0.3 - 1.2 mg/dL Final  . GFR calc non Af Amer 06/14/2017 53* >60 mL/min Final  . GFR calc Af Amer 06/14/2017 >60  >60 mL/min Final   Comment: (NOTE) The eGFR has been calculated using the CKD EPI equation. This calculation has not been validated in all clinical situations. eGFR's persistently <60 mL/min signify possible Chronic Kidney Disease.   . Anion gap 06/14/2017 7  5 - 15 Final    STUDIES: No results found.  ASSESSMENT:  Chronic lymphocytic leukemia, Rai stage 0.  PLAN:    1. CLL:  Diagnosis previously confirmed her peripheral blood flow cytometry. Patient's white blood cell count continues to be mildly increased, but essentially stable. It has ranged between 23 and 43 since November 2016. The remainder of her blood counts are within normal limits. Her palpable lymphadenopathy may be related to underlying CLL, but no intervention is needed. Patient does not wish to undergo CT scan at this time, but would reconsider in the future if her lymph nodes enlarged. No intervention is needed at this time. Return to clinic in 6 months with repeat laboratory work and further evaluation.  2. Shortness breath: Continue oxygen at night as needed. 3. Hypertension: Patient's blood pressure is decreased today. Continue monitoring her treatment per primary care. 4. Lymphadenopathy: Possibly secondary to underlying CLL. Will defer imaging at this time as above.  Patient expressed understanding and was in agreement with this plan. She also understands that She can call clinic at any time with any questions, concerns, or complaints.    Lloyd Huger, MD   06/14/2017 3:14 PM

## 2017-06-14 ENCOUNTER — Inpatient Hospital Stay: Payer: Medicare Other | Attending: Oncology | Admitting: Oncology

## 2017-06-14 ENCOUNTER — Inpatient Hospital Stay: Payer: Medicare Other

## 2017-06-14 VITALS — BP 95/65 | HR 69 | Temp 97.2°F | Resp 18 | Ht 61.0 in | Wt 161.4 lb

## 2017-06-14 DIAGNOSIS — Z85850 Personal history of malignant neoplasm of thyroid: Secondary | ICD-10-CM | POA: Diagnosis not present

## 2017-06-14 DIAGNOSIS — R591 Generalized enlarged lymph nodes: Secondary | ICD-10-CM

## 2017-06-14 DIAGNOSIS — Z9981 Dependence on supplemental oxygen: Secondary | ICD-10-CM | POA: Diagnosis not present

## 2017-06-14 DIAGNOSIS — R0602 Shortness of breath: Secondary | ICD-10-CM | POA: Diagnosis not present

## 2017-06-14 DIAGNOSIS — I10 Essential (primary) hypertension: Secondary | ICD-10-CM | POA: Diagnosis not present

## 2017-06-14 DIAGNOSIS — Z803 Family history of malignant neoplasm of breast: Secondary | ICD-10-CM | POA: Diagnosis not present

## 2017-06-14 DIAGNOSIS — Z8042 Family history of malignant neoplasm of prostate: Secondary | ICD-10-CM | POA: Diagnosis not present

## 2017-06-14 DIAGNOSIS — C9190 Lymphoid leukemia, unspecified not having achieved remission: Secondary | ICD-10-CM | POA: Diagnosis not present

## 2017-06-14 DIAGNOSIS — C9110 Chronic lymphocytic leukemia of B-cell type not having achieved remission: Secondary | ICD-10-CM

## 2017-06-14 DIAGNOSIS — Z8619 Personal history of other infectious and parasitic diseases: Secondary | ICD-10-CM | POA: Diagnosis not present

## 2017-06-14 DIAGNOSIS — Z87891 Personal history of nicotine dependence: Secondary | ICD-10-CM

## 2017-06-14 DIAGNOSIS — E785 Hyperlipidemia, unspecified: Secondary | ICD-10-CM | POA: Diagnosis not present

## 2017-06-14 DIAGNOSIS — Z79899 Other long term (current) drug therapy: Secondary | ICD-10-CM | POA: Diagnosis not present

## 2017-06-14 DIAGNOSIS — Z86010 Personal history of colonic polyps: Secondary | ICD-10-CM | POA: Insufficient documentation

## 2017-06-14 LAB — COMPREHENSIVE METABOLIC PANEL
ALT: 12 U/L — ABNORMAL LOW (ref 14–54)
ANION GAP: 7 (ref 5–15)
AST: 19 U/L (ref 15–41)
Albumin: 4.2 g/dL (ref 3.5–5.0)
Alkaline Phosphatase: 41 U/L (ref 38–126)
BILIRUBIN TOTAL: 0.6 mg/dL (ref 0.3–1.2)
BUN: 18 mg/dL (ref 6–20)
CHLORIDE: 95 mmol/L — AB (ref 101–111)
CO2: 30 mmol/L (ref 22–32)
Calcium: 9.1 mg/dL (ref 8.9–10.3)
Creatinine, Ser: 0.97 mg/dL (ref 0.44–1.00)
GFR, EST NON AFRICAN AMERICAN: 53 mL/min — AB (ref >60)
Glucose, Bld: 79 mg/dL (ref 65–99)
POTASSIUM: 4.6 mmol/L (ref 3.5–5.1)
Sodium: 132 mmol/L — ABNORMAL LOW (ref 135–145)
TOTAL PROTEIN: 7 g/dL (ref 6.5–8.1)

## 2017-06-14 LAB — CBC WITH DIFFERENTIAL/PLATELET
Basophils Absolute: 0.4 10*3/uL — ABNORMAL HIGH (ref 0–0.1)
Basophils Relative: 1 %
EOS PCT: 0 %
Eosinophils Absolute: 0.1 10*3/uL (ref 0–0.7)
HEMATOCRIT: 40.3 % (ref 35.0–47.0)
Hemoglobin: 13.2 g/dL (ref 12.0–16.0)
Lymphocytes Relative: 83 %
Lymphs Abs: 29.5 10*3/uL — ABNORMAL HIGH (ref 1.0–3.6)
MCH: 27.5 pg (ref 26.0–34.0)
MCHC: 32.7 g/dL (ref 32.0–36.0)
MCV: 83.9 fL (ref 80.0–100.0)
MONO ABS: 0.9 10*3/uL (ref 0.2–0.9)
MONOS PCT: 3 %
Neutro Abs: 4.8 10*3/uL (ref 1.4–6.5)
Neutrophils Relative %: 13 %
PLATELETS: 162 10*3/uL (ref 150–440)
RBC: 4.81 MIL/uL (ref 3.80–5.20)
RDW: 13.9 % (ref 11.5–14.5)
WBC: 35.7 10*3/uL — ABNORMAL HIGH (ref 3.6–11.0)

## 2017-06-14 NOTE — Progress Notes (Signed)
Patient here today for follow up.  Patient c/o lymphnodes under both arms are hard/swollen noticed about 2-3 weeks ago.

## 2017-06-15 ENCOUNTER — Inpatient Hospital Stay: Payer: Medicare Other

## 2017-06-15 ENCOUNTER — Inpatient Hospital Stay: Payer: Medicare Other | Admitting: Oncology

## 2017-06-28 DIAGNOSIS — L821 Other seborrheic keratosis: Secondary | ICD-10-CM | POA: Diagnosis not present

## 2017-06-28 DIAGNOSIS — X32XXXA Exposure to sunlight, initial encounter: Secondary | ICD-10-CM | POA: Diagnosis not present

## 2017-06-28 DIAGNOSIS — D225 Melanocytic nevi of trunk: Secondary | ICD-10-CM | POA: Diagnosis not present

## 2017-06-28 DIAGNOSIS — L570 Actinic keratosis: Secondary | ICD-10-CM | POA: Diagnosis not present

## 2017-07-03 ENCOUNTER — Other Ambulatory Visit: Payer: Self-pay | Admitting: Internal Medicine

## 2017-08-04 DIAGNOSIS — Z23 Encounter for immunization: Secondary | ICD-10-CM | POA: Diagnosis not present

## 2017-08-09 ENCOUNTER — Telehealth: Payer: Self-pay | Admitting: Internal Medicine

## 2017-08-09 NOTE — Telephone Encounter (Signed)
[facility] scheduled appt tomorrow.

## 2017-08-09 NOTE — Telephone Encounter (Signed)
Forward

## 2017-08-09 NOTE — Telephone Encounter (Signed)
Left message for patient to return call to our office.  

## 2017-08-09 NOTE — Telephone Encounter (Signed)
Pt called about both of her legs are weak after getting the flu shot Wednesday at [facility]. Pt needs a afternoon appt . Pt was transferred to [facility]. Thank you!

## 2017-08-09 NOTE — Telephone Encounter (Signed)
Patient Name: Lisa Figueroa  DOB: 1932-03-26    Initial Comment Caller states c/o bilateral leg weakness and difficulty walking after getting flu shot last Wednesday.   Nurse Assessment  Nurse: Raphael Gibney, RN, Vanita Ingles Date/Time (Eastern Time): 08/09/2017 11:38:43 AM  Confirm and document reason for call. If symptomatic, describe symptoms. ---Caller states she has bilateral leg weakness and she is having difficulty walking. She received her flu shot last Wednesday. Weakness started last Thursday. She can get to the bathroom using walker.  Does the patient have any new or worsening symptoms? ---Yes  Will a triage be completed? ---Yes  Related visit to physician within the last 2 weeks? ---No  Does the PT have any chronic conditions? (i.e. diabetes, asthma, etc.) ---Yes  List chronic conditions. ---CLL; COPD  Is this a behavioral health or substance abuse call? ---No     Guidelines    Guideline Title Affirmed Question Affirmed Notes  Weakness (Generalized) and Fatigue [1] MODERATE weakness (i.e., interferes with work, school, normal activities) AND [2] persists > 3 days    Final Disposition User   See Physician within 24 Hours [place], RN, [place]    Comments  Call was dropped before I could confim phone number with the patient. Office staff confirmed this phone number to be correct. I tried to call her back 3 times but didn't get an answer.  Caller states she was disconnected; nurse was calling her back but she couldn't flash to take the call; transferred to nurse.  appt scheduled for 08/10/2017 at 9:45 am with Dr. Tommi Rumps   Referrals  REFERRED TO PCP OFFICE   Disagree/Comply: Comply

## 2017-08-09 NOTE — Telephone Encounter (Signed)
Awaiting [facility] note 

## 2017-08-09 NOTE — Telephone Encounter (Signed)
Can you call and triage her, thanks

## 2017-08-10 ENCOUNTER — Observation Stay: Payer: Medicare Other

## 2017-08-10 ENCOUNTER — Ambulatory Visit (INDEPENDENT_AMBULATORY_CARE_PROVIDER_SITE_OTHER): Payer: Medicare Other | Admitting: Family Medicine

## 2017-08-10 ENCOUNTER — Emergency Department: Payer: Medicare Other

## 2017-08-10 ENCOUNTER — Observation Stay
Admission: EM | Admit: 2017-08-10 | Discharge: 2017-08-11 | Disposition: A | Discharge status: Home / Self Care | Payer: Medicare Other | Attending: Specialist | Admitting: Specialist

## 2017-08-10 ENCOUNTER — Encounter: Payer: Self-pay | Admitting: Emergency Medicine

## 2017-08-10 ENCOUNTER — Encounter: Payer: Self-pay | Admitting: Family Medicine

## 2017-08-10 DIAGNOSIS — Z9842 Cataract extraction status, left eye: Secondary | ICD-10-CM | POA: Insufficient documentation

## 2017-08-10 DIAGNOSIS — Z8619 Personal history of other infectious and parasitic diseases: Secondary | ICD-10-CM | POA: Insufficient documentation

## 2017-08-10 DIAGNOSIS — Z888 Allergy status to other drugs, medicaments and biological substances status: Secondary | ICD-10-CM | POA: Diagnosis not present

## 2017-08-10 DIAGNOSIS — J189 Pneumonia, unspecified organism: Secondary | ICD-10-CM

## 2017-08-10 DIAGNOSIS — C9110 Chronic lymphocytic leukemia of B-cell type not having achieved remission: Secondary | ICD-10-CM | POA: Diagnosis present

## 2017-08-10 DIAGNOSIS — J309 Allergic rhinitis, unspecified: Secondary | ICD-10-CM | POA: Insufficient documentation

## 2017-08-10 DIAGNOSIS — J181 Lobar pneumonia, unspecified organism: Secondary | ICD-10-CM

## 2017-08-10 DIAGNOSIS — I2510 Atherosclerotic heart disease of native coronary artery without angina pectoris: Secondary | ICD-10-CM | POA: Insufficient documentation

## 2017-08-10 DIAGNOSIS — J9621 Acute and chronic respiratory failure with hypoxia: Secondary | ICD-10-CM | POA: Diagnosis not present

## 2017-08-10 DIAGNOSIS — E890 Postprocedural hypothyroidism: Secondary | ICD-10-CM | POA: Diagnosis not present

## 2017-08-10 DIAGNOSIS — Z880 Allergy status to penicillin: Secondary | ICD-10-CM | POA: Insufficient documentation

## 2017-08-10 DIAGNOSIS — I700 Atherosclerosis of aorta: Secondary | ICD-10-CM | POA: Insufficient documentation

## 2017-08-10 DIAGNOSIS — I712 Thoracic aortic aneurysm, without rupture: Secondary | ICD-10-CM | POA: Insufficient documentation

## 2017-08-10 DIAGNOSIS — J209 Acute bronchitis, unspecified: Secondary | ICD-10-CM | POA: Diagnosis not present

## 2017-08-10 DIAGNOSIS — R262 Difficulty in walking, not elsewhere classified: Secondary | ICD-10-CM | POA: Diagnosis not present

## 2017-08-10 DIAGNOSIS — C9190 Lymphoid leukemia, unspecified not having achieved remission: Secondary | ICD-10-CM | POA: Diagnosis not present

## 2017-08-10 DIAGNOSIS — Z90710 Acquired absence of both cervix and uterus: Secondary | ICD-10-CM | POA: Insufficient documentation

## 2017-08-10 DIAGNOSIS — E7800 Pure hypercholesterolemia, unspecified: Secondary | ICD-10-CM | POA: Insufficient documentation

## 2017-08-10 DIAGNOSIS — Z6830 Body mass index (BMI) 30.0-30.9, adult: Secondary | ICD-10-CM | POA: Insufficient documentation

## 2017-08-10 DIAGNOSIS — R29898 Other symptoms and signs involving the musculoskeletal system: Secondary | ICD-10-CM | POA: Diagnosis not present

## 2017-08-10 DIAGNOSIS — J439 Emphysema, unspecified: Secondary | ICD-10-CM | POA: Insufficient documentation

## 2017-08-10 DIAGNOSIS — Z808 Family history of malignant neoplasm of other organs or systems: Secondary | ICD-10-CM | POA: Insufficient documentation

## 2017-08-10 DIAGNOSIS — Z85850 Personal history of malignant neoplasm of thyroid: Secondary | ICD-10-CM | POA: Diagnosis not present

## 2017-08-10 DIAGNOSIS — Z87891 Personal history of nicotine dependence: Secondary | ICD-10-CM | POA: Diagnosis not present

## 2017-08-10 DIAGNOSIS — R531 Weakness: Secondary | ICD-10-CM

## 2017-08-10 DIAGNOSIS — J80 Acute respiratory distress syndrome: Secondary | ICD-10-CM | POA: Diagnosis present

## 2017-08-10 DIAGNOSIS — Z8042 Family history of malignant neoplasm of prostate: Secondary | ICD-10-CM | POA: Insufficient documentation

## 2017-08-10 DIAGNOSIS — B0222 Postherpetic trigeminal neuralgia: Secondary | ICD-10-CM | POA: Diagnosis not present

## 2017-08-10 DIAGNOSIS — Z9981 Dependence on supplemental oxygen: Secondary | ICD-10-CM | POA: Insufficient documentation

## 2017-08-10 DIAGNOSIS — B0229 Other postherpetic nervous system involvement: Secondary | ICD-10-CM | POA: Insufficient documentation

## 2017-08-10 DIAGNOSIS — R0602 Shortness of breath: Secondary | ICD-10-CM | POA: Diagnosis not present

## 2017-08-10 DIAGNOSIS — Z803 Family history of malignant neoplasm of breast: Secondary | ICD-10-CM | POA: Insufficient documentation

## 2017-08-10 DIAGNOSIS — I10 Essential (primary) hypertension: Secondary | ICD-10-CM | POA: Insufficient documentation

## 2017-08-10 DIAGNOSIS — Z86010 Personal history of colonic polyps: Secondary | ICD-10-CM | POA: Insufficient documentation

## 2017-08-10 DIAGNOSIS — E785 Hyperlipidemia, unspecified: Secondary | ICD-10-CM | POA: Diagnosis not present

## 2017-08-10 DIAGNOSIS — R5383 Other fatigue: Secondary | ICD-10-CM | POA: Insufficient documentation

## 2017-08-10 DIAGNOSIS — E860 Dehydration: Secondary | ICD-10-CM | POA: Insufficient documentation

## 2017-08-10 DIAGNOSIS — Z8249 Family history of ischemic heart disease and other diseases of the circulatory system: Secondary | ICD-10-CM | POA: Insufficient documentation

## 2017-08-10 DIAGNOSIS — E669 Obesity, unspecified: Secondary | ICD-10-CM | POA: Insufficient documentation

## 2017-08-10 LAB — COMPREHENSIVE METABOLIC PANEL
ALBUMIN: 4.2 g/dL (ref 3.5–5.0)
ALK PHOS: 48 U/L (ref 38–126)
ALT: 22 U/L (ref 14–54)
AST: 32 U/L (ref 15–41)
Anion gap: 8 (ref 5–15)
BILIRUBIN TOTAL: 0.7 mg/dL (ref 0.3–1.2)
BUN: 16 mg/dL (ref 6–20)
CALCIUM: 10.3 mg/dL (ref 8.9–10.3)
CO2: 34 mmol/L — ABNORMAL HIGH (ref 22–32)
CREATININE: 0.9 mg/dL (ref 0.44–1.00)
Chloride: 95 mmol/L — ABNORMAL LOW (ref 101–111)
GFR calc Af Amer: >60 mL/min (ref >60)
GFR, EST NON AFRICAN AMERICAN: 58 mL/min — AB (ref >60)
GLUCOSE: 91 mg/dL (ref 65–99)
POTASSIUM: 4.7 mmol/L (ref 3.5–5.1)
Sodium: 137 mmol/L (ref 135–145)
TOTAL PROTEIN: 6.9 g/dL (ref 6.5–8.1)

## 2017-08-10 LAB — URINALYSIS, COMPLETE (UACMP) WITH MICROSCOPIC
BACTERIA UA: NONE SEEN
BILIRUBIN URINE: NEGATIVE
Glucose, UA: NEGATIVE mg/dL
Hgb urine dipstick: NEGATIVE
Ketones, ur: NEGATIVE mg/dL
Leukocytes, UA: NEGATIVE
Nitrite: NEGATIVE
PH: 7 (ref 5.0–8.0)
Protein, ur: NEGATIVE mg/dL
SPECIFIC GRAVITY, URINE: 1.01 (ref 1.005–1.030)

## 2017-08-10 LAB — CBC WITH DIFFERENTIAL/PLATELET
BASOS ABS: 0.3 10*3/uL — AB (ref 0–0.1)
BASOS PCT: 1 %
Eosinophils Absolute: 0 10*3/uL (ref 0–0.7)
Eosinophils Relative: 0 %
HEMATOCRIT: 39.5 % (ref 35.0–47.0)
HEMOGLOBIN: 12.5 g/dL (ref 12.0–16.0)
LYMPHS PCT: 85 %
Lymphs Abs: 29 10*3/uL — ABNORMAL HIGH (ref 1.0–3.6)
MCH: 26.8 pg (ref 26.0–34.0)
MCHC: 31.8 g/dL — ABNORMAL LOW (ref 32.0–36.0)
MCV: 84.5 fL (ref 80.0–100.0)
MONOS PCT: 3 %
Monocytes Absolute: 1 10*3/uL — ABNORMAL HIGH (ref 0.2–0.9)
NEUTROS ABS: 3.8 10*3/uL (ref 1.4–6.5)
NEUTROS PCT: 11 %
Platelets: 101 10*3/uL — ABNORMAL LOW (ref 150–440)
RBC: 4.67 MIL/uL (ref 3.80–5.20)
RDW: 14.6 % — AB (ref 11.5–14.5)
WBC: 34.1 10*3/uL — ABNORMAL HIGH (ref 3.6–11.0)

## 2017-08-10 LAB — TROPONIN I

## 2017-08-10 LAB — TSH: TSH: 0.209 u[IU]/mL — ABNORMAL LOW (ref 0.350–4.500)

## 2017-08-10 LAB — CKMB (ARMC ONLY): CK, MB: 0.8 ng/mL (ref 0.5–5.0)

## 2017-08-10 LAB — LACTIC ACID, PLASMA: Lactic Acid, Venous: 1 mmol/L (ref 0.5–1.9)

## 2017-08-10 MED ORDER — GUAIFENESIN ER 600 MG PO TB12
600.0000 mg | ORAL_TABLET | Freq: Two times a day (BID) | ORAL | Status: DC
  Administered 2017-08-10 – 2017-08-11 (×3): 600 mg via ORAL
  Filled 2017-08-10 (×3): qty 1

## 2017-08-10 MED ORDER — LEVOFLOXACIN IN D5W 750 MG/150ML IV SOLN
750.0000 mg | Freq: Once | INTRAVENOUS | Status: AC
  Administered 2017-08-10: 750 mg via INTRAVENOUS
  Filled 2017-08-10: qty 150

## 2017-08-10 MED ORDER — ACETAMINOPHEN 325 MG PO TABS
650.0000 mg | ORAL_TABLET | Freq: Four times a day (QID) | ORAL | Status: DC | PRN
  Administered 2017-08-10: 650 mg via ORAL
  Filled 2017-08-10: qty 2

## 2017-08-10 MED ORDER — AMLODIPINE BESYLATE 5 MG PO TABS
2.5000 mg | ORAL_TABLET | Freq: Every day | ORAL | Status: DC
  Administered 2017-08-11: 2.5 mg via ORAL
  Filled 2017-08-10: qty 1

## 2017-08-10 MED ORDER — SODIUM CHLORIDE 0.9% FLUSH
3.0000 mL | Freq: Two times a day (BID) | INTRAVENOUS | Status: DC
  Administered 2017-08-10 – 2017-08-11 (×3): 3 mL via INTRAVENOUS

## 2017-08-10 MED ORDER — SODIUM CHLORIDE 0.9 % IV SOLN: 250.0000 mL | INTRAVENOUS | Status: DC | PRN

## 2017-08-10 MED ORDER — ENSURE ENLIVE PO LIQD
237.0000 mL | Freq: Two times a day (BID) | ORAL | Status: DC
  Administered 2017-08-10 – 2017-08-11 (×2): 237 mL via ORAL

## 2017-08-10 MED ORDER — SODIUM CHLORIDE 0.9% FLUSH: 3.0000 mL | INTRAVENOUS | Status: DC | PRN

## 2017-08-10 MED ORDER — LEVOTHYROXINE SODIUM 50 MCG PO TABS
150.0000 ug | ORAL_TABLET | Freq: Every day | ORAL | Status: DC
  Administered 2017-08-11: 150 ug via ORAL
  Filled 2017-08-10: qty 1

## 2017-08-10 MED ORDER — ONDANSETRON HCL 4 MG/2ML IJ SOLN: 4.0000 mg | Freq: Four times a day (QID) | INTRAMUSCULAR | Status: DC | PRN

## 2017-08-10 MED ORDER — LEVOFLOXACIN IN D5W 250 MG/50ML IV SOLN
250.0000 mg | INTRAVENOUS | Status: DC
  Administered 2017-08-11: 250 mg via INTRAVENOUS
  Filled 2017-08-10 (×2): qty 50

## 2017-08-10 MED ORDER — LOSARTAN POTASSIUM 50 MG PO TABS
100.0000 mg | ORAL_TABLET | Freq: Every day | ORAL | Status: DC
  Administered 2017-08-11: 100 mg via ORAL
  Filled 2017-08-10: qty 2

## 2017-08-10 MED ORDER — ACETAMINOPHEN 650 MG RE SUPP: 650.0000 mg | Freq: Four times a day (QID) | RECTAL | Status: DC | PRN

## 2017-08-10 MED ORDER — ONDANSETRON HCL 4 MG PO TABS: 4.0000 mg | ORAL_TABLET | Freq: Four times a day (QID) | ORAL | Status: DC | PRN

## 2017-08-10 MED ORDER — ALBUTEROL SULFATE (2.5 MG/3ML) 0.083% IN NEBU
2.5000 mg | INHALATION_SOLUTION | RESPIRATORY_TRACT | Status: DC
  Administered 2017-08-10 – 2017-08-11 (×3): 2.5 mg via RESPIRATORY_TRACT
  Filled 2017-08-10 (×3): qty 3

## 2017-08-10 MED ORDER — ENOXAPARIN SODIUM 40 MG/0.4ML ~~LOC~~ SOLN
40.0000 mg | SUBCUTANEOUS | Status: DC
  Administered 2017-08-10: 21:00:00 40 mg via SUBCUTANEOUS
  Filled 2017-08-10: qty 0.4

## 2017-08-10 MED ORDER — IOPAMIDOL (ISOVUE-370) INJECTION 76%
75.0000 mL | Freq: Once | INTRAVENOUS | Status: AC | PRN
  Administered 2017-08-10: 75 mL via INTRAVENOUS

## 2017-08-10 MED ORDER — TIOTROPIUM BROMIDE MONOHYDRATE 18 MCG IN CAPS
18.0000 ug | ORAL_CAPSULE | Freq: Every day | RESPIRATORY_TRACT | Status: DC
  Administered 2017-08-11: 09:00:00 18 ug via RESPIRATORY_TRACT
  Filled 2017-08-10: qty 5

## 2017-08-10 MED ORDER — SODIUM CHLORIDE 0.9 % IV BOLUS (SEPSIS)
1000.0000 mL | Freq: Once | INTRAVENOUS | Status: AC
  Administered 2017-08-10: 1000 mL via INTRAVENOUS

## 2017-08-10 MED ORDER — BUDESONIDE 0.25 MG/2ML IN SUSP
0.2500 mg | Freq: Two times a day (BID) | RESPIRATORY_TRACT | Status: DC
  Administered 2017-08-10 – 2017-08-11 (×3): 0.25 mg via RESPIRATORY_TRACT
  Filled 2017-08-10 (×3): qty 2

## 2017-08-10 NOTE — Progress Notes (Signed)
Pt  Came from ed after sent over  From pcp office with low sats .  Dx acute bronchitis. Lungs  Clear but diminished.  Some shortness of breath with increased exertion. While amb pts sats drop to 82%. Pt on o2 at home at  Night at 2 l [place].  amb  Mostly with cane at home  But recently   Started using walker to amb.  Due  To weakness. 02 placed here at 2 l [place].

## 2017-08-10 NOTE — Progress Notes (Signed)
  Tommi Rumps, MD Phone: [phone]  Lisa Figueroa is a 83 y.o. female who presents today for same-day visit.  Patient notes increased leg weakness over the last week or so. She had a flu shot about a week ago and the next day she had difficulty moving her legs. She had difficulty walking and use her walker. Notes it's somewhat improved though still has weakness mostly below her knees. She also reports she's been using her oxygen more and she's felt a little more short of breath over the last 3-4 weeks. She's been using her oxygen daily for the last week.  PMH: Former smoker   ROS see history of present illness  Objective  Physical Exam Vitals:   08/10/17 0940  BP: 118/62  Pulse: 81  Temp: 97.6 F (36.4 C)  SpO2: 91%    BP Readings from Last 3 Encounters:  08/10/17 118/62  06/14/17 95/65  05/05/17 112/70   Wt Readings from Last 3 Encounters:  08/10/17 163 lb 9.6 oz (74.2 kg)  06/14/17 161 lb 7 oz (73.2 kg)  05/05/17 163 lb 6.4 oz (74.1 kg)    Physical Exam  Constitutional: No distress.  Cardiovascular: Normal rate, regular rhythm and normal heart sounds.   Pulmonary/Chest: Effort normal and breath sounds normal.  Neurological: She is alert.  4/5 strength bilateral quads, hamstrings, bilateral plantar and dorsiflexion, 5/5 strength bilateral biceps, triceps, and grip, absent reflexes in patellar and Achilles tendons, also absent in triceps, intact reflexes in brachioradialis and biceps tendons  Skin: Skin is warm and dry. She is not diaphoretic.     Assessment/Plan: Please see individual problem list.  Bilateral leg weakness Patient with onset of bilateral weakness 5-6 days ago after getting her flu shot. Has weakness evidenced on exam and has absent reflexes. She also notes some increased trouble breathing over the last several weeks though it has worsened significantly over the last week. She's been wearing her oxygen constantly over the last week. Given that  this occurred after the flu shot and she has had weakness and absent patellar reflexes concern would be for guillan barre syndrome. Given this I feel she warrants evaluation in the emergency room for monitoring and further evaluation. EMS will be contacted to transport the patient given the breathing issues. She additionally does not have any transport to the emergency room. CMA will contact ED charge nurse.  Tommi Rumps, MD [place]

## 2017-08-10 NOTE — Progress Notes (Signed)
Pt refused  To have bed alarm on, instructed  Pt in use of call bell to call for assist and not get up by sely

## 2017-08-10 NOTE — ED Notes (Signed)
Patient given ginger ale per Dr. Darl Householder. Patient sitting in bed with family at bedside. NAD noted. No further needs expressed. Will continue to monitor.

## 2017-08-10 NOTE — H&P (Signed)
[facility] NAME: Lisa Figueroa    MR#:  [id number]  DATE OF BIRTH:  [birth year]  DATE OF ADMISSION:  08/10/2017  PRIMARY CARE PHYSICIAN: Crecencio Mc, MD   REQUESTING/REFERRING PHYSICIAN:   CHIEF COMPLAINT:   Chief Complaint  Patient presents with  . Weakness    HISTORY OF PRESENT ILLNESS: Lisa Figueroa  is a 83 y.o. female with a known history of CLL and COPD, chronic respiratory failure, on oxygen at nighttime, who presents to the hospital with weakness in lower extremities since about 1 week ago, when she received a flu shot. She complained of weakness in the lower extremities below her knees. She also admitted of hypoxia over the past one week, requiring using oxygen therapy at Daytime as well, not only at night. Patient was felt to have crackles on the right side of the lung by emergency room physician, possible pneumonia, she was initiated on antibiotic therapy, hospitalist services was consulted for admission. She lives alone, admits of having difficulty walking, even to the bathroom.  PAST MEDICAL HISTORY:   Past Medical History:  Diagnosis Date  . CLL (chronic lymphocytic leukemia) ([place])   . History of colon polyps   . History of shingles   . Hyperlipidemia   . Hypertension   . Post herpetic neuralgia   . Shingles   . Thyroid cancer ([place]) 1998  . Thyroid cancer ([place])     PAST SURGICAL HISTORY: Past Surgical History:  Procedure Laterality Date  . ABDOMINAL HYSTERECTOMY    . APPENDECTOMY    . BREAST SURGERY Right    cyst excision  . CARDIAC CATHETERIZATION  05/18/2008   At [facility]: mild 30% stenosis in LCX. normal EF.    Marland Kitchen CATARACT EXTRACTION W/PHACO Left 06/04/2015   Procedure: CATARACT EXTRACTION PHACO AND INTRAOCULAR LENS PLACEMENT (IOC);  Surgeon: Birder Robson, MD;  Location: [facility] ORS;  Service: Ophthalmology;  Laterality: Left;  [place] 00:34.1 AP% 23.0 CDE 7.85  . CHOLECYSTECTOMY    . HEMORRHOID  SURGERY    . TOTAL THYROIDECTOMY  1998    SOCIAL HISTORY:  Social History  Substance Use Topics  . Smoking status: Former Smoker    Packs/day: 1.00    Years: 20.00    Quit date: 08/08/1984  . Smokeless tobacco: Never Used  . Alcohol use 0.0 oz/week     Comment: wine glass daily    FAMILY HISTORY:  Family History  Problem Relation Age of Onset  . Cancer Brother        prostate and bone  . Heart disease Mother   . Heart disease Father   . Cancer Maternal Aunt        breast  . Cancer Cousin        breast - maternal side    DRUG ALLERGIES:  Allergies  Allergen Reactions  . Penicillin G Swelling    .Has patient had a PCN reaction causing immediate rash, facial/tongue/throat swelling, SOB or lightheadedness with hypotension: Yes Has patient had a PCN reaction causing severe rash involving mucus membranes or skin necrosis: Unknown Has patient had a PCN reaction that required hospitalization: Unknown Has patient had a PCN reaction occurring within the last 10 years: Unknown If all of the above answers are "NO", then may proceed with Cephalosporin use.   . Prednisone Other (See Comments)    crys and can't sleep crys and can't sleep    Review of Systems  Constitutional: Positive for  malaise/fatigue. Negative for chills, fever and weight loss.  HENT: Negative for congestion.   Eyes: Positive for blurred vision. Negative for double vision.  Respiratory: Positive for shortness of breath. Negative for cough, sputum production and wheezing.   Cardiovascular: Negative for chest pain, palpitations, orthopnea, leg swelling and PND.  Gastrointestinal: Negative for abdominal pain, blood in stool, constipation, diarrhea, nausea and vomiting.  Genitourinary: Negative for dysuria, frequency, hematuria and urgency.  Musculoskeletal: Negative for falls.  Neurological: Positive for weakness. Negative for dizziness, tremors, focal weakness and headaches.  Endo/Heme/Allergies: Does not  bruise/bleed easily.  Psychiatric/Behavioral: Negative for depression. The patient does not have insomnia.     MEDICATIONS AT HOME:  Prior to Admission medications   Medication Sig Start Date End Date Taking? Authorizing Provider  acetaminophen (TYLENOL) 500 MG tablet Take 500 mg by mouth daily.   Yes [provider]  amLODipine (NORVASC) 2.5 MG tablet Take 1 tablet (2.5 mg total) by mouth daily. 09/02/16  Yes Crecencio Mc, MD  Cholecalciferol (VITAMIN D) 2000 units CAPS Take by mouth daily.   Yes [provider]  INCRUSE ELLIPTA 62.5 MCG/INH AEPB INHALE 1 PUFF INTO THE LUNGS ONCE A DAY 07/05/17  Yes Laverle Hobby, MD  levothyroxine (SYNTHROID, LEVOTHROID) 150 MCG tablet TAKE 1 TABLET BY MOUTH EVERY DAY. 02/22/17  Yes Crecencio Mc, MD  losartan (COZAAR) 100 MG tablet Take 1 tablet (100 mg total) by mouth daily. 07/06/16  Yes Leone Haven, MD  OXYGEN Inhale 2 L into the lungs. 2 liters @ night   Yes [provider]  feeding supplement, ENSURE ENLIVE, (ENSURE ENLIVE) LIQD Take 237 mLs by mouth 2 (two) times daily between meals. 10/07/16   Hower, Aaron Mose, MD  gabapentin (NEURONTIN) 400 MG capsule TAKE ONE CAPSULE BY MOUTH TWICE A DAY Patient not taking: Reported on 08/10/2017 04/08/17   Crecencio Mc, MD      PHYSICAL EXAMINATION:   VITAL SIGNS: Blood pressure (!) 146/69, pulse 67, temperature 97.9 F (36.6 C), temperature source Oral, resp. rate (!) 27, height 5\' 1"  (1.549 m), weight 73.9 kg (163 lb), SpO2 99 %.  GENERAL:  83 y.o.-year-old patient lying in the bed In mild to moderate respiratory distress, especially whenever she moves around, talks. Dyspneic with exertion EYES: Pupils equal, round, reactive to light and accommodation. No scleral icterus. Extraocular muscles intact.  HEENT: Head atraumatic, normocephalic. Oropharynx and nasopharynx clear.  NECK:  Supple, no jugular venous distention. No thyroid enlargement, no tenderness.  LUNGS:  Diminished breath sounds bilaterally, no wheezing, rales,rhonchi , few scattered crepitations were noted in the bilateral lung fields posteriorly. Using accessory muscles of respiration on exertion, dyspneic, uncomfortable.  CARDIOVASCULAR: S1, S2 , rhythm is regular. No murmurs, rubs, or gallops, distant sounds.  ABDOMEN: Soft, nontender, nondistended. Bowel sounds present. No organomegaly or mass.  EXTREMITIES: No pedal edema, cyanosis, or clubbing. Lymphadenopathy in bilateral axillary areas NEUROLOGIC: Cranial nerves II through XII are intact. Muscle strength 5/5 in all extremities. Sensation intact. Gait not checked.  PSYCHIATRIC: The patient is alert and oriented x 3.  SKIN: No obvious rash, lesion, or ulcer. Some petechial rash in lower extremities below the knees  LABORATORY PANEL:   CBC  Recent Labs Lab 08/10/17 1125  WBC 34.1*  HGB 12.5  HCT 39.5  PLT 101*  MCV 84.5  MCH 26.8  MCHC 31.8*  RDW 14.6*  LYMPHSABS PENDING  MONOABS PENDING  EOSABS PENDING  BASOSABS PENDING   ------------------------------------------------------------------------------------------------------------------  Chemistries  Recent Labs Lab 08/10/17 1125  NA 137  K 4.7  CL 95*  CO2 34*  GLUCOSE 91  BUN 16  CREATININE 0.90  CALCIUM 10.3  AST 32  ALT 22  ALKPHOS 48  BILITOT 0.7   ------------------------------------------------------------------------------------------------------------------  Cardiac Enzymes  Recent Labs Lab 08/10/17 1125  TROPONINI <0.03   ------------------------------------------------------------------------------------------------------------------  RADIOLOGY: Dg Chest 2 View  Result Date: 08/10/2017 CLINICAL DATA:  Increasing leg weakness over the past week. Difficulty moving the legs following a flu shot 1 week ago. On home oxygen with increased utilization at home. History of CLL, thyroid malignancy, chronic bronchitis, former smoker. EXAM: CHEST  2  VIEW COMPARISON:  Chest x-ray of February 15, 2017 FINDINGS: The lungs are adequately inflated. The interstitial markings are coarse especially at the lung bases. There is no alveolar infiltrate or pleural effusion. The cardiac silhouette is top-normal in size and stable. There is calcification in the wall of the thoracic aorta. The mediastinum is normal in width. The bony thorax exhibits no acute abnormality. IMPRESSION: Chronic bronchitic changes, stable. No pneumonia, CHF, nor other acute cardiopulmonary abnormality. Thoracic aortic atherosclerosis. Electronically Signed   By: David  Martinique M.D.   On: 08/10/2017 11:23   Ct Head Wo Contrast  Result Date: 08/10/2017 CLINICAL DATA:  Pt here with c/o increased weakness since last Wednesday after getting flu shot at PCP. PCP sent here for evaluation. Pt denies any symptoms other than weakness EXAM: CT HEAD WITHOUT CONTRAST TECHNIQUE: Contiguous axial images were obtained from the base of the skull through the vertex without intravenous contrast. COMPARISON:  05/31/2013 FINDINGS: Brain: No evidence of acute infarction, hemorrhage, extra-axial collection, ventriculomegaly, or mass effect. Generalized cerebral atrophy. Periventricular white matter low attenuation likely secondary to microangiopathy. 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 Vascular: Cerebrovascular atherosclerotic calcifications are noted. Skull: Negative for fracture or focal lesion. Sinuses/Orbits: Visualized portions of the orbits are unremarkable. Visualized portions of the paranasal sinuses and mastoid air cells are unremarkable. Other: None. IMPRESSION: 1. No acute intracranial pathology. 2. Chronic microvascular disease and cerebral atrophy. Electronically Signed   By: Kathreen Devoid   On: 08/10/2017 11:55    EKG: Orders placed or performed during the hospital encounter of 08/10/17  . EKG 12-Lead  . EKG 12-Lead  . EKG 12-Lead  . EKG 12-Lead   EKG in the emergency room  reveals sinus rhythm at 60 bpm RSR' in V1 and V2, possible normal variant according to EKG criteria, no acute ST-T changes   IMPRESSION AND PLAN:  Active Problems:   CLL (chronic lymphocytic leukemia) (HCC)   Acute bronchitis   Generalized weakness   Acute on chronic respiratory failure with hypoxia ([facility])  #1. Acute on chronic history failure with hypoxia, continue oxygen therapy, get CT angiogram of the chest to rule out pulmonary embolism, pneumonia, etc., initiate patient on budesonide, DuoNeb nebs, antibiotics, get sputum cultures if possible, follow clinically. Acute get BNP.  #2. Acute bronchitis, continue levofloxacin, get sputum culture if possible, adjust antibiotics depending on culture results, getting CT angiogram of the chest to rule out pneumonia #3. Generalized weakness, get physical therapist involved for further recommendations, patient may need rehabilitation before returning back home, physical exam does not support diagnosis of Guillain-Barr #4 CLL, patient was seen by Dr. Grayland Ormond in June 2018, apparently her brother cell count has been ranging from 20 02/26/1940. Since November 2016, no intervention was recommended at that time, recommended to return to clinic in 6 months. #5. Essential hypertension,  continue outpatient medications, follow blood pressure readings closely  All the records are reviewed and case discussed with ED provider. Management plans discussed with the patient, family and they are in agreement.  CODE STATUS: Code Status History    Date Active Date Inactive Code Status Order ID Comments User Context   10/04/2016 11:01 PM 10/07/2016  4:16 PM Full Code [id number]  Lance Coon, MD Inpatient       TOTAL TIME TAKING CARE OF THIS PATIENT: 60 minutes.    Theodoro Grist M.D on 08/10/2017 at 1:06 PM  Between 7am to 6pm - Pager - [phone] After 6pm go to [email or web address] - password [email or web address] [facility]  Office   [phone]  CC: Primary care physician; Crecencio Mc, MD

## 2017-08-10 NOTE — Progress Notes (Addendum)
PHARMACY NOTE:  ANTIMICROBIAL RENAL DOSAGE ADJUSTMENT  Current antimicrobial regimen includes a mismatch between antimicrobial dosage and estimated renal function.  As per policy approved by the Pharmacy & Therapeutics and Medical Executive Committees, the antimicrobial dosage will be adjusted accordingly.  Current antimicrobial dosage:  Levaquin 500 mg IV Q24H.   Indication: CAP , acute bronchitis  Renal Function:    Estimated Creatinine Clearance: 44.3 mL/min (by C-G formula based on SCr of 0.9 mg/dL). []      On intermittent HD, scheduled: []      On CRRT    Antimicrobial dosage has been changed to:  Levaquin 250 mg IV Q24H to start 8/15 @ 10:00.   Additional comments:   Thank you for allowing pharmacy to be a part of this patient's care.  Bliss Tsang D, [facility] 08/10/2017 2:49 PM

## 2017-08-10 NOTE — ED Triage Notes (Signed)
Pt here with c/o increased weakness since last Wednesday after getting flu shot at PCP. PCP sent here for evaluation. Pt denies any symptoms other than weakness, is supposed to wear oxygen at home but only wears PRN (2L).

## 2017-08-10 NOTE — ED Notes (Signed)
Assisted patient to bathroom. Patient with unsteady gait but able to ambulate with assistance. Specimen collected and sent to lab.

## 2017-08-10 NOTE — ED Provider Notes (Signed)
[facility] Provider Note   CSN: [id number] Arrival date & time: 08/10/17  1046     History   Chief Complaint Chief Complaint  Patient presents with  . Weakness    HPI Lisa Figueroa is a 83 y.o. female history of hypertension, high cholesterol, CLL here presenting with weakness, shortness of breath. Patient had a flu shot about 6 days ago and since then has been feeling weak. Patient states that she has a walker at home and she has been having to use it all the time. She also has been having some worsening subjective shortness of breath as well. Patient uses 2 L nasal cannula at baseline. Patient had some chills but denies any fevers or cough. Denies any vomiting or diarrhea. Patient went to PCP and sent for evaluation of weakness. In particular there was a concerned for Guillain barre syndrome but patient states that weakness is not rapidly progressive and patient has no numbness.   The history is provided by the patient.    Past Medical History:  Diagnosis Date  . CLL (chronic lymphocytic leukemia) ([place])   . History of colon polyps   . History of shingles   . Hyperlipidemia   . Hypertension   . Post herpetic neuralgia   . Shingles   . Thyroid cancer ([place]) 1998  . Thyroid cancer [facility])     Patient Active Problem List   Diagnosis Date Noted  . Bilateral leg weakness 08/10/2017  . Rib fracture 02/15/2017  . Chronic respiratory failure with hypoxia, on home O2 therapy ([place]) 10/16/2016  . Hospital discharge follow-up 10/16/2016  . Allergic rhinitis 07/03/2016  . CLL (chronic lymphocytic leukemia) ([place]) 02/02/2016  . History of bad fall 06/20/2015  . Medicare annual wellness visit, subsequent 05/22/2015  . Essential hypertension 11/19/2014  . Hyperlipidemia 11/19/2014  . Osteoporosis 11/18/2014  . S/P thyroidectomy 11/18/2014  . Postsurgical hypothyroidism 11/18/2014  . Post-herpetic trigeminal neuralgia 11/15/2014  . Need for prophylactic vaccination  against Streptococcus pneumoniae (pneumococcus) 08/08/2014  . Abnormal mammogram 05/18/2013    Past Surgical History:  Procedure Laterality Date  . ABDOMINAL HYSTERECTOMY    . APPENDECTOMY    . BREAST SURGERY Right    cyst excision  . CARDIAC CATHETERIZATION  05/18/2008   At [facility]: mild 30% stenosis in LCX. normal EF.    Marland Kitchen CATARACT EXTRACTION W/PHACO Left 06/04/2015   Procedure: CATARACT EXTRACTION PHACO AND INTRAOCULAR LENS PLACEMENT (IOC);  Surgeon: Birder Robson, MD;  Location: [facility] ORS;  Service: Ophthalmology;  Laterality: Left;  [place] 00:34.1 AP% 23.0 CDE 7.85  . CHOLECYSTECTOMY    . HEMORRHOID SURGERY    . TOTAL THYROIDECTOMY  1998    OB History    Gravida Para Term Preterm AB Living   1         1   SAB TAB Ectopic Multiple Live Births                  Obstetric Comments   1st Menstrual Cycle:  ? 1st Pregnancy: 25       Home Medications    Prior to Admission medications   Medication Sig Start Date End Date Taking? Authorizing Provider  acetaminophen (TYLENOL) 500 MG tablet Take 500 mg by mouth daily.   Yes [provider]  amLODipine (NORVASC) 2.5 MG tablet Take 1 tablet (2.5 mg total) by mouth daily. 09/02/16  Yes Crecencio Mc, MD  Cholecalciferol (VITAMIN D) 2000 units CAPS Take by mouth daily.   Yes [provider]  INCRUSE ELLIPTA 62.5 MCG/INH AEPB INHALE 1 PUFF INTO THE LUNGS ONCE A DAY 07/05/17  Yes Laverle Hobby, MD  levothyroxine (SYNTHROID, LEVOTHROID) 150 MCG tablet TAKE 1 TABLET BY MOUTH EVERY DAY. 02/22/17  Yes Crecencio Mc, MD  losartan (COZAAR) 100 MG tablet Take 1 tablet (100 mg total) by mouth daily. 07/06/16  Yes Leone Haven, MD  OXYGEN Inhale 2 L into the lungs. 2 liters @ night   Yes [provider]  feeding supplement, ENSURE ENLIVE, (ENSURE ENLIVE) LIQD Take 237 mLs by mouth 2 (two) times daily between meals. 10/07/16   Hower, Aaron Mose, MD  gabapentin (NEURONTIN) 400 MG capsule TAKE ONE CAPSULE BY MOUTH  TWICE A DAY Patient not taking: Reported on 08/10/2017 04/08/17   Crecencio Mc, MD    Family History Family History  Problem Relation Age of Onset  . Cancer Brother        prostate and bone  . Heart disease Mother   . Heart disease Father   . Cancer Maternal Aunt        breast  . Cancer Cousin        breast - maternal side    Social History Social History  Substance Use Topics  . Smoking status: Former Smoker    Packs/day: 1.00    Years: 20.00    Quit date: 08/08/1984  . Smokeless tobacco: Never Used  . Alcohol use 0.0 oz/week     Comment: wine glass daily     Allergies   Penicillin g and Prednisone   Review of Systems Review of Systems  Neurological: Positive for weakness.  All other systems reviewed and are negative.    Physical Exam Updated Vital Signs BP (!) 147/89   Pulse 68   Temp 97.9 F (36.6 C) (Oral)   Resp (!) 22   Ht 5\' 1"  (1.549 m)   Wt 73.9 kg (163 lb)   SpO2 97%   BMI 30.80 kg/m   Physical Exam  Constitutional: She is oriented to person, place, and time.  Chronically ill, tired, mildly dehydrated   HENT:  Head: Normocephalic.  MM slightly dry   Eyes: Pupils are equal, round, and reactive to light. EOM are normal.  Neck: Normal range of motion. Neck supple.  Cardiovascular: Normal rate, regular rhythm and normal heart sounds.   Pulmonary/Chest:  Crackles R base   Abdominal: Soft. Bowel sounds are normal. She exhibits no distension. There is no tenderness.  Musculoskeletal: Normal range of motion.  Neurological: She is alert and oriented to person, place, and time. She displays normal reflexes. No cranial nerve deficit. Coordination normal.  CN 2-12 intact. Strength 4/5 bilateral legs. Nl reflexes bilaterally, nl sensation bilaterally   Skin: Skin is warm.  Psychiatric: She has a normal mood and affect.  Nursing note and vitals reviewed.    ED Treatments / Results  Labs (all labs ordered are listed, but only abnormal results  are displayed) Labs Reviewed  CULTURE, BLOOD (ROUTINE X 2)  CULTURE, BLOOD (ROUTINE X 2)  URINE CULTURE  CBC WITH DIFFERENTIAL/PLATELET  COMPREHENSIVE METABOLIC PANEL  TROPONIN I  LACTIC ACID, PLASMA  URINALYSIS, COMPLETE (UACMP) WITH MICROSCOPIC    EKG  EKG Interpretation  Date/Time:  Tuesday August 10 2017 10:55:45 EDT Ventricular Rate:  68 PR Interval:    QRS Duration: 102 QT Interval:  375 QTC Calculation: 399 R Axis:   49 Text Interpretation:  Sinus rhythm RSR' in V1 or V2, probably normal  variant No significant change since last tracing Confirmed by Wandra Arthurs ([id number]) on 08/10/2017 11:24:06 AM       Radiology Dg Chest 2 View  Result Date: 08/10/2017 CLINICAL DATA:  Increasing leg weakness over the past week. Difficulty moving the legs following a flu shot 1 week ago. On home oxygen with increased utilization at home. History of CLL, thyroid malignancy, chronic bronchitis, former smoker. EXAM: CHEST  2 VIEW COMPARISON:  Chest x-ray of February 15, 2017 FINDINGS: The lungs are adequately inflated. The interstitial markings are coarse especially at the lung bases. There is no alveolar infiltrate or pleural effusion. The cardiac silhouette is top-normal in size and stable. There is calcification in the wall of the thoracic aorta. The mediastinum is normal in width. The bony thorax exhibits no acute abnormality. IMPRESSION: Chronic bronchitic changes, stable. No pneumonia, CHF, nor other acute cardiopulmonary abnormality. Thoracic aortic atherosclerosis. Electronically Signed   By: David  Martinique M.D.   On: 08/10/2017 11:23    Procedures Procedures (including critical care time)  Medications Ordered in ED Medications  sodium chloride 0.9 % bolus 1,000 mL (not administered)     Initial Impression / Assessment and Plan / ED Course  I have reviewed the triage vital signs and the nursing notes.  Pertinent labs & imaging results that were available during my care of the  patient were reviewed by me and considered in my medical decision making (see chart for details).     VIEVA BRUMMITT is a 83 y.o. female here with progressive weakness after flu shot. Clinically, she has pneumonia on my exam. Suspect that is causing her weakness. She has normal reflexes bilaterally, 4/5 strength bilateral legs. I have low suspicion for Guillain Barre syndrome. Will get labs, UA, CXR, sepsis workup, CT head.   12:21 PM CT head unremarkable. WBC 35, similar to previous (hx of CLL). Lactate nl. CXR clear but clinically has pneumonia. Given levaquin. She lives by herself and has trouble taking care of herself due to weakness. Called Dr. Doy Mince from neuro, who will see patient but I have low suspicion for [facility]. Hospitalist to admit.    Final Clinical Impressions(s) / ED Diagnoses   Final diagnoses:  None    New Prescriptions New Prescriptions   No medications on file     Drenda Freeze, MD 08/10/17 1227

## 2017-08-10 NOTE — Consult Note (Signed)
Reason for Consult:Rule out GBS Referring Physician: Darl Householder  CC: Weakness  HPI: Lisa Figueroa is an 83 y.o. female with multiple medical problems who has been doing poorly for the past 2-3 weeks.  She has had more trouble breathing, been more fatigued and noted some generalized weakness.  On Wednesday of last week she got her flu shot and on Thursday she noted that she was even weaker.  She has had more trouble maintaining her oxygenation and having to rely on her walker more.  This has not been progressive but hs not improved. She has not had any sensory complaints.    Past Medical History:  Diagnosis Date  . CLL (chronic lymphocytic leukemia) ([place])   . History of colon polyps   . History of shingles   . Hyperlipidemia   . Hypertension   . Post herpetic neuralgia   . Shingles   . Thyroid cancer ([place]) 1998  . Thyroid cancer [facility])     Past Surgical History:  Procedure Laterality Date  . ABDOMINAL HYSTERECTOMY    . APPENDECTOMY    . BREAST SURGERY Right    cyst excision  . CARDIAC CATHETERIZATION  05/18/2008   At [facility]: mild 30% stenosis in LCX. normal EF.    Marland Kitchen CATARACT EXTRACTION W/PHACO Left 06/04/2015   Procedure: CATARACT EXTRACTION PHACO AND INTRAOCULAR LENS PLACEMENT (IOC);  Surgeon: Birder Robson, MD;  Location: [facility] ORS;  Service: Ophthalmology;  Laterality: Left;  [place] 00:34.1 AP% 23.0 CDE 7.85  . CHOLECYSTECTOMY    . HEMORRHOID SURGERY    . TOTAL THYROIDECTOMY  1998    Family History  Problem Relation Age of Onset  . Cancer Brother        prostate and bone  . Heart disease Mother   . Heart disease Father   . Cancer Maternal Aunt        breast  . Cancer Cousin        breast - maternal side    Social History:  reports that she quit smoking about 33 years ago. She has a 20.00 pack-year smoking history. She has never used smokeless tobacco. She reports that she drinks alcohol. She reports that she does not use drugs.  Allergies  Allergen Reactions  . Penicillin G  Swelling    .Has patient had a PCN reaction causing immediate rash, facial/tongue/throat swelling, SOB or lightheadedness with hypotension: Yes Has patient had a PCN reaction causing severe rash involving mucus membranes or skin necrosis: Unknown Has patient had a PCN reaction that required hospitalization: Unknown Has patient had a PCN reaction occurring within the last 10 years: Unknown If all of the above answers are "NO", then may proceed with Cephalosporin use.   . Prednisone Other (See Comments)    crys and can't sleep crys and can't sleep    Medications:  I have reviewed the patient's current medications. Prior to Admission:  Prescriptions Prior to Admission  Medication Sig Dispense Refill Last Dose  . acetaminophen (TYLENOL) 500 MG tablet Take 500 mg by mouth daily.   PRN at PRN  . amLODipine (NORVASC) 2.5 MG tablet Take 1 tablet (2.5 mg total) by mouth daily. 90 tablet 3 08/10/2017 at 0800  . Cholecalciferol (VITAMIN D) 2000 units CAPS Take by mouth daily.   08/10/2017 at Unknown time  . INCRUSE ELLIPTA 62.5 MCG/INH AEPB INHALE 1 PUFF INTO THE LUNGS ONCE A DAY 30 each 5 08/10/2017 at 0800  . levothyroxine (SYNTHROID, LEVOTHROID) 150 MCG tablet TAKE 1 TABLET BY  MOUTH EVERY DAY. 90 tablet 2 08/10/2017 at 0730  . losartan (COZAAR) 100 MG tablet Take 1 tablet (100 mg total) by mouth daily. 90 tablet 3 08/10/2017 at 0800  . OXYGEN Inhale 2 L into the lungs. 2 liters @ night   PRN at PRN  . feeding supplement, ENSURE ENLIVE, (ENSURE ENLIVE) LIQD Take 237 mLs by mouth 2 (two) times daily between meals. 237 mL 12 Taking  . gabapentin (NEURONTIN) 400 MG capsule TAKE ONE CAPSULE BY MOUTH TWICE A DAY (Patient not taking: Reported on 08/10/2017) 270 capsule 2 Not Taking at Unknown time    ROS: History obtained from the patient  General ROS: fatigue Psychological ROS: memory difficulties Ophthalmic ROS: negative for - blurry vision, double vision, eye pain or loss of vision ENT ROS:  HOH Allergy and Immunology ROS: negative for - hives or itchy/watery eyes Hematological and Lymphatic ROS: negative for - bleeding problems, bruising or swollen lymph nodes Endocrine ROS: negative for - galactorrhea, hair pattern changes, polydipsia/polyuria or temperature intolerance Respiratory ROS: negative for - cough, hemoptysis, shortness of breath or wheezing Cardiovascular ROS: negative for - chest pain, dyspnea on exertion, edema or irregular heartbeat Gastrointestinal ROS: negative for - abdominal pain, diarrhea, hematemesis, nausea/vomiting or stool incontinence Genito-Urinary ROS: negative for - dysuria, hematuria, incontinence or urinary frequency/urgency Musculoskeletal ROS: leg pain Neurological ROS: as noted in HPI Dermatological ROS: negative for rash and skin lesion changes  Physical Examination: Blood pressure (!) 147/74, pulse 65, temperature 97.9 F (36.6 C), temperature source Oral, resp. rate 17, height 5\' 1"  (1.549 m), weight 73.9 kg (163 lb), SpO2 99 %.  HEENT-  Normocephalic, no lesions, without obvious abnormality.  Normal external eye and conjunctiva.  Normal TM's bilaterally.  Normal auditory canals and external ears. Normal external nose, mucus membranes and septum.  Normal pharynx. Cardiovascular- S1, S2 normal, pulses palpable throughout   Lungs- chest clear, no wheezing, rales, normal symmetric air entry Abdomen- soft, non-tender; bowel sounds normal; no masses,  no organomegaly Extremities- no edema Lymph-no adenopathy palpable Musculoskeletal-pain on palpation of the lower legs bilaterally Skin-warm and dry, no hyperpigmentation, vitiligo, or suspicious lesions  Neurological Examination   Mental Status: Alert, oriented, thought content appropriate.  Speech fluent without evidence of aphasia.  Able to follow 3 step commands without difficulty. Cranial Nerves: II: Discs flat bilaterally; Visual fields grossly normal, pupils equal, round, reactive to light  and accommodation III,IV, VI: ptosis not present, extra-ocular motions intact bilaterally V,VII: decrease in the right NLF, facial light touch sensation normal bilaterally VIII: hearing normal bilaterally IX,X: gag reflex present XI: bilateral shoulder shrug XII: midline tongue extension Motor: Right : Upper extremity   5/5    Left:     Upper extremity   5/5 5-/5 hip flexors bilaterally with 5/5 otherwise in the lower extremities distally.   Sensory: Pinprick and light touch intact throughout, bilaterally Deep Tendon Reflexes: 2+ in the upper extremities, 1+ at the knees and absent at the ankles.   Plantars: Right: mute   Left: mute Cerebellar: Normal finger-to-nose and normal heel-to-shin testing bilaterally Gait: not tested due to safety concerns    Laboratory Studies:   Basic Metabolic Panel:  Recent Labs Lab 08/10/17 1125  NA 137  K 4.7  CL 95*  CO2 34*  GLUCOSE 91  BUN 16  CREATININE 0.90  CALCIUM 10.3    Liver Function Tests:  Recent Labs Lab 08/10/17 1125  AST 32  ALT 22  ALKPHOS 48  BILITOT 0.7  PROT 6.9  ALBUMIN 4.2   No results for input(s): LIPASE, AMYLASE in the last 168 hours. No results for input(s): AMMONIA in the last 168 hours.  CBC:  Recent Labs Lab 08/10/17 1125  WBC 34.1*  NEUTROABS 3.8  HGB 12.5  HCT 39.5  MCV 84.5  PLT 101*    Cardiac Enzymes:  Recent Labs Lab 08/10/17 1125  TROPONINI <0.03    BNP: Invalid input(s): POCBNP  CBG: No results for input(s): GLUCAP in the last 168 hours.  Microbiology: Results for orders placed or performed in visit on 02/15/17  Urine Culture     Status: None   Collection Time: 02/15/17  1:44 PM  Result Value Ref Range Status   Culture ESCHERICHIA COLI  Final   Colony Count 50,000-100,000 CFU/mL  Final   Organism ID, Bacteria ESCHERICHIA COLI  Final      Susceptibility   Escherichia coli -  (no method available)    CEFAZOLIN 26 Sensitive     AMPICILLIN <=2 Sensitive      AMOX/CLAVULANIC <=2 Sensitive     AMPICILLIN/SULBACTAM <=2 Sensitive     PIP/TAZO <=4 Sensitive     IMIPENEM <=0.25 Sensitive     CEFTRIAXONE <=1 Sensitive     CEFTAZIDIME <=1 Sensitive     CEFEPIME <=1 Sensitive     GENTAMICIN <=1 Sensitive     TOBRAMYCIN <=1 Sensitive     CIPROFLOXACIN >=4 Resistant     LEVOFLOXACIN >=8 Resistant     NITROFURANTOIN <=16 Sensitive     TRIMETH/SULFA* <=20 Sensitive      * NR=NOT REPORTABLE,SEE COMMENTORAL therapy:A cefazolin MIC of <32 predicts susceptibility to the oral agents cefaclor,cefdinir,cefpodoxime,cefprozil,cefuroxime,cephalexin,and loracarbef when used for therapy of uncomplicated UTIs due to E.coli,K.pneumomiae,and P.mirabilis. PARENTERAL therapy: A cefazolinMIC of >8 indicates resistance to parenteralcefazolin. An alternate test method must beperformed to confirm susceptibility to parenteralcefazolin.    Coagulation Studies: No results for input(s): LABPROT, INR in the last 72 hours.  Urinalysis:  Recent Labs Lab 08/10/17 1129  COLORURINE YELLOW*  LABSPEC 1.010  PHURINE 7.0  GLUCOSEU NEGATIVE  HGBUR NEGATIVE  BILIRUBINUR NEGATIVE  KETONESUR NEGATIVE  PROTEINUR NEGATIVE  NITRITE NEGATIVE  LEUKOCYTESUR NEGATIVE    Lipid Panel:     Component Value Date/Time   CHOL 215 (H) 03/04/2017 1202   TRIG 142.0 03/04/2017 1202   HDL 43.40 03/04/2017 1202   CHOLHDL 5 03/04/2017 1202   VLDL 28.4 03/04/2017 1202   LDLCALC 143 (H) 03/04/2017 1202    HgbA1C:  Lab Results  Component Value Date   HGBA1C 5.2 05/14/2015    Urine Drug Screen:  No results found for: LABOPIA, COCAINSCRNUR, LABBENZ, AMPHETMU, THCU, LABBARB  Alcohol Level: No results for input(s): ETH in the last 168 hours.  Other results: EKG: sinus rhythm at 68 bpm.  Imaging: Dg Chest 2 View  Result Date: 08/10/2017 CLINICAL DATA:  Increasing leg weakness over the past week. Difficulty moving the legs following a flu shot 1 week ago. On home oxygen with increased  utilization at home. History of CLL, thyroid malignancy, chronic bronchitis, former smoker. EXAM: CHEST  2 VIEW COMPARISON:  Chest x-ray of February 15, 2017 FINDINGS: The lungs are adequately inflated. The interstitial markings are coarse especially at the lung bases. There is no alveolar infiltrate or pleural effusion. The cardiac silhouette is top-normal in size and stable. There is calcification in the wall of the thoracic aorta. The mediastinum is normal in width. The bony thorax exhibits no acute abnormality. IMPRESSION: Chronic bronchitic  changes, stable. No pneumonia, CHF, nor other acute cardiopulmonary abnormality. Thoracic aortic atherosclerosis. Electronically Signed   By: David  Martinique M.D.   On: 08/10/2017 11:23   Ct Head Wo Contrast  Result Date: 08/10/2017 CLINICAL DATA:  Pt here with c/o increased weakness since last Wednesday after getting flu shot at PCP. PCP sent here for evaluation. Pt denies any symptoms other than weakness EXAM: CT HEAD WITHOUT CONTRAST TECHNIQUE: Contiguous axial images were obtained from the base of the skull through the vertex without intravenous contrast. COMPARISON:  05/31/2013 FINDINGS: Brain: No evidence of acute infarction, hemorrhage, extra-axial collection, ventriculomegaly, or mass effect. Generalized cerebral atrophy. Periventricular white matter low attenuation likely secondary to microangiopathy. 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 Vascular: Cerebrovascular atherosclerotic calcifications are noted. Skull: Negative for fracture or focal lesion. Sinuses/Orbits: Visualized portions of the orbits are unremarkable. Visualized portions of the paranasal sinuses and mastoid air cells are unremarkable. Other: None. IMPRESSION: 1. No acute intracranial pathology. 2. Chronic microvascular disease and cerebral atrophy. Electronically Signed   By: Kathreen Devoid   On: 08/10/2017 11:55   Ct Angio Chest Pe W And/or Wo Contrast  Result Date:  08/10/2017 CLINICAL DATA:  Hypertension, high cholesterol, CLL. Shortness of breath. EXAM: CT ANGIOGRAPHY CHEST WITH CONTRAST TECHNIQUE: Multidetector CT imaging of the chest was performed using the standard protocol during bolus administration of intravenous contrast. Multiplanar CT image reconstructions and MIPs were obtained to evaluate the vascular anatomy. CONTRAST:  75 cc Isovue 370 IV COMPARISON:  10/04/2016 FINDINGS: Cardiovascular: No filling defects in the pulmonary arteries to suggestpulmonary emboli. Diffuse aortic calcifications. Mild aneurysmal dilatation of the ascending thoracic aorta, 4.3 cm. Heart is borderline in size. Scattered coronary artery calcifications. Mediastinum/Nodes: Marked bilateral axillary adenopathy, worsening since prior study. Index left axillary lymph node has a short axis diameter of 4.3 cm compared to 1.4 cm previously. Index right axillary lymph node has a short axis diameter of 2.5 cm compared to 1.1 cm previously. Borderline size hilar lymph nodes. Enlarged right hilar lymph node measures 2 cm in short axis diameter. This compares to 1.5 cm previously. Mildly enlarged mediastinal lymph nodes. Prevascular lymph node has a short axis diameter of 10 mm, similar to prior study. Right paratracheal node has a short axis diameter of 10 mm, stable since prior study. Lungs/Pleura: Ground-glass opacities again noted in the upper lobes, likely scarring. Centrilobular emphysema. No confluent opacities or effusions. Upper Abdomen: Splenomegaly again partially imaged, likely increased since prior study. Suspect upper abdominal adenopathy in the region of the celiac axis/gastrohepatic ligament. Index lymph node measures 11 mm in short axis diameter in the gastrohepatic ligament, or increased since prior study. Musculoskeletal: Chest wall soft tissues are unremarkable. No acute bony abnormality. Review of the MIP images confirms the above findings. IMPRESSION: Marked worsening of and  bilateral axillary adenopathy. Stable mildly enlarged mediastinal and left hilar lymph nodes. Mild enlargement of right hilar lymph nodes. Enlarging upper abdominal lymph nodes and worsening splenomegaly. No evidence of pulmonary embolus. 4.3 cm ascending thoracic aortic aneurysm. Recommend annual imaging followup by CTA or MRA. This recommendation follows 2010 ACCF/AHA/AATS/ACR/ASA/SCA/SCAI/SIR/STS/SVM Guidelines for the Diagnosis and Management of Patients with Thoracic Aortic Disease. Circulation. 2010; 121: W098-J191 Aortic Atherosclerosis (ICD10-I70.0) and Emphysema (ICD10-J43.9). Electronically Signed   By: Rolm Baptise M.D.   On: 08/10/2017 13:44     Assessment/Plan: 83 year old female with worsening functional status, exacerbated after flu shot.  Symptoms have not been progressive since their worsening after flu  shot and she has no sensory complaints.  DTR's preserved.  Doubt GBS.  Patient does have worsening adenopathy on CT of chest and elevated wbc count.  This is likely affecting her oxygenation and functional status.  Head CT reviewed and shows no acute changes.    Recommendations: 1.  CK 2.  PT/OT consults 3.  Agree with treatment of medical issues    Alexis Goodell, MD Neurology [phone] 08/10/2017, 1:58 PM

## 2017-08-10 NOTE — ED Notes (Signed)
Pt to xray

## 2017-08-10 NOTE — Assessment & Plan Note (Addendum)
Patient with onset of bilateral weakness 5-6 days ago after getting her flu shot. Has weakness evidenced on exam and has absent reflexes. She also notes some increased trouble breathing over the last several weeks though it has worsened significantly over the last week. She's been wearing her oxygen constantly over the last week. Given that this occurred after the flu shot and she has had weakness and absent patellar reflexes concern would be for guillan barre syndrome. Given this I feel she warrants evaluation in the emergency room for monitoring and further evaluation. EMS will be contacted to transport the patient given the breathing issues. She additionally does not have any transport to the emergency room. CMA will contact ED charge nurse.

## 2017-08-10 NOTE — ED Notes (Signed)
Neurology at bedside.

## 2017-08-11 ENCOUNTER — Telehealth: Payer: Self-pay | Admitting: *Deleted

## 2017-08-11 DIAGNOSIS — C9190 Lymphoid leukemia, unspecified not having achieved remission: Secondary | ICD-10-CM | POA: Diagnosis not present

## 2017-08-11 DIAGNOSIS — C9110 Chronic lymphocytic leukemia of B-cell type not having achieved remission: Secondary | ICD-10-CM | POA: Diagnosis not present

## 2017-08-11 DIAGNOSIS — R531 Weakness: Secondary | ICD-10-CM | POA: Diagnosis not present

## 2017-08-11 DIAGNOSIS — J9601 Acute respiratory failure with hypoxia: Secondary | ICD-10-CM | POA: Diagnosis not present

## 2017-08-11 DIAGNOSIS — J209 Acute bronchitis, unspecified: Secondary | ICD-10-CM | POA: Diagnosis not present

## 2017-08-11 DIAGNOSIS — J9621 Acute and chronic respiratory failure with hypoxia: Secondary | ICD-10-CM | POA: Diagnosis not present

## 2017-08-11 LAB — TROPONIN I: Troponin I: <0.03 ng/mL (ref <0.03)

## 2017-08-11 LAB — BASIC METABOLIC PANEL
ANION GAP: 8 (ref 5–15)
BUN: 16 mg/dL (ref 6–20)
CALCIUM: 10 mg/dL (ref 8.9–10.3)
CO2: 32 mmol/L (ref 22–32)
Chloride: 96 mmol/L — ABNORMAL LOW (ref 101–111)
Creatinine, Ser: 1.11 mg/dL — ABNORMAL HIGH (ref 0.44–1.00)
GFR, EST AFRICAN AMERICAN: 52 mL/min — AB (ref >60)
GFR, EST NON AFRICAN AMERICAN: 45 mL/min — AB (ref >60)
Glucose, Bld: 96 mg/dL (ref 65–99)
Potassium: 3.9 mmol/L (ref 3.5–5.1)
Sodium: 136 mmol/L (ref 135–145)

## 2017-08-11 LAB — CBC
HCT: 34.3 % — ABNORMAL LOW (ref 35.0–47.0)
HEMOGLOBIN: 11 g/dL — AB (ref 12.0–16.0)
MCH: 27.3 pg (ref 26.0–34.0)
MCHC: 32.1 g/dL (ref 32.0–36.0)
MCV: 85.1 fL (ref 80.0–100.0)
Platelets: 86 10*3/uL — ABNORMAL LOW (ref 150–440)
RBC: 4.03 MIL/uL (ref 3.80–5.20)
RDW: 14.4 % (ref 11.5–14.5)
WBC: 36.6 10*3/uL — ABNORMAL HIGH (ref 3.6–11.0)

## 2017-08-11 LAB — URINE CULTURE

## 2017-08-11 LAB — MISC LABCORP TEST (SEND OUT): Labcorp test code: 1453

## 2017-08-11 LAB — GLUCOSE, CAPILLARY: GLUCOSE-CAPILLARY: 83 mg/dL (ref 65–99)

## 2017-08-11 MED ORDER — ORAL CARE MOUTH RINSE: 15.0000 mL | Freq: Two times a day (BID) | OROMUCOSAL | Status: DC

## 2017-08-11 MED ORDER — GABAPENTIN 400 MG PO CAPS
400.0000 mg | ORAL_CAPSULE | Freq: Every day | ORAL | Status: DC
  Administered 2017-08-11: 400 mg via ORAL
  Filled 2017-08-11: qty 1

## 2017-08-11 MED ORDER — LEVOFLOXACIN 250 MG PO TABS: 250.0000 mg | ORAL_TABLET | Freq: Every day | ORAL | 0 refills | Status: AC

## 2017-08-11 MED ORDER — ALBUTEROL SULFATE (2.5 MG/3ML) 0.083% IN NEBU
2.5000 mg | INHALATION_SOLUTION | Freq: Three times a day (TID) | RESPIRATORY_TRACT | Status: DC
  Filled 2017-08-11: qty 3

## 2017-08-11 MED ORDER — LEVOTHYROXINE SODIUM 150 MCG PO TABS: 150.0000 ug | ORAL_TABLET | Freq: Every day | ORAL | Status: DC

## 2017-08-11 MED ORDER — ALBUTEROL SULFATE (2.5 MG/3ML) 0.083% IN NEBU: 2.5000 mg | INHALATION_SOLUTION | RESPIRATORY_TRACT | Status: DC | PRN

## 2017-08-11 NOTE — Progress Notes (Signed)
Discharge instructions given and went over with patient and patients daughter at bedside. Prescription given and reviewed. All questions answered. Patient discharged home via wheelchair by nursing staff. Madlyn Frankel, RN

## 2017-08-11 NOTE — Progress Notes (Addendum)
Subjective: Patient appears at baseline strength this morning.  Up walking with therapy.    Objective: Current vital signs: BP 130/65 (BP Location: Left Arm)   Pulse 68   Temp 97.8 F (36.6 C) (Oral)   Resp 20   Ht 5\' 1"  (1.549 m)   Wt 72.7 kg (160 lb 4.8 oz)   SpO2 97%   BMI 30.29 kg/m  Vital signs in last 24 hours: Temp:  [97.6 F (36.4 C)-97.9 F (36.6 C)] 97.8 F (36.6 C) (08/15 0401) Pulse Rate:  [65-76] 68 (08/15 0401) Resp:  [16-27] 20 (08/15 0401) BP: (123-147)/(63-89) 130/65 (08/15 0401) SpO2:  [82 %-99 %] 97 % (08/15 0748) Weight:  [72.7 kg (160 lb 4.8 oz)-73.9 kg (163 lb)] 72.7 kg (160 lb 4.8 oz) (08/15 0500)  Intake/Output from previous day: 08/14 0701 - 08/15 0700 In: 1237 [P.O.:237; IV Piggyback:1000] Out: 400 [Urine:400] Intake/Output this shift: Total I/O In: 240 [P.O.:240] Out: -  Nutritional status: Diet regular Room service appropriate? Yes; Fluid consistency: Thin  Neurologic Exam: Mental Status: Alert, oriented, thought content appropriate.  Speech fluent without evidence of aphasia.  Able to follow 3 step commands without difficulty. Cranial Nerves: II: Discs flat bilaterally; Visual fields grossly normal, pupils equal, round, reactive to light and accommodation III,IV, VI: ptosis not present, extra-ocular motions intact bilaterally V,VII: decrease in the right NLF, facial light touch sensation normal bilaterally VIII: hearing normal bilaterally IX,X: gag reflex present XI: bilateral shoulder shrug XII: midline tongue extension Motor: Right :  Upper extremity   5/5                                      Left:     Upper extremity   5/5 5-/5 hip flexors bilaterally with 5/5 otherwise in the lower extremities distally.   Sensory: Pinprick and light touch intact throughout, bilaterally Deep Tendon Reflexes: 2+ in the upper extremities, 1+ at the knees and absent at the ankles.    Lab Results: Basic Metabolic Panel:  Recent Labs Lab  08/10/17 1125 08/11/17 0244  NA 137 136  K 4.7 3.9  CL 95* 96*  CO2 34* 32  GLUCOSE 91 96  BUN 16 16  CREATININE 0.90 1.11*  CALCIUM 10.3 10.0    Liver Function Tests:  Recent Labs Lab 08/10/17 1125  AST 32  ALT 22  ALKPHOS 48  BILITOT 0.7  PROT 6.9  ALBUMIN 4.2   No results for input(s): LIPASE, AMYLASE in the last 168 hours. No results for input(s): AMMONIA in the last 168 hours.  CBC:  Recent Labs Lab 08/10/17 1125 08/11/17 0244  WBC 34.1* 36.6*  NEUTROABS 3.8  --   HGB 12.5 11.0*  HCT 39.5 34.3*  MCV 84.5 85.1  PLT 101* 86*    Cardiac Enzymes:  Recent Labs Lab 08/10/17 1125 08/10/17 1509 08/10/17 2040 08/11/17 0244  CKMB 0.8  --   --   --   TROPONINI <0.03 <0.03 <0.03 <0.03    Lipid Panel: No results for input(s): CHOL, TRIG, HDL, CHOLHDL, VLDL, LDLCALC in the last 168 hours.  CBG:  Recent Labs Lab 08/11/17 7867  EHMCNO 70    Microbiology: Results for orders placed or performed during the hospital encounter of 08/10/17  Blood culture (routine x 2)     Status: None (Preliminary result)   Collection Time: 08/10/17 11:25 AM  Result Value Ref Range Status  Specimen Description BLOOD RIGHT ANTECUBITAL  Final   Special Requests   Final    BOTTLES DRAWN AEROBIC AND ANAEROBIC Blood Culture adequate volume   Culture NO GROWTH < 24 HOURS  Final   Report Status PENDING  Incomplete  Blood culture (routine x 2)     Status: None (Preliminary result)   Collection Time: 08/10/17 12:00 PM  Result Value Ref Range Status   Specimen Description BLOOD RIGHT ANTECUBITAL  Final   Special Requests   Final    BOTTLES DRAWN AEROBIC AND ANAEROBIC Blood Culture adequate volume   Culture NO GROWTH < 24 HOURS  Final   Report Status PENDING  Incomplete    Coagulation Studies: No results for input(s): LABPROT, INR in the last 72 hours.  Imaging: Dg Chest 2 View  Result Date: 08/10/2017 CLINICAL DATA:  Increasing leg weakness over the past week.  Difficulty moving the legs following a flu shot 1 week ago. On home oxygen with increased utilization at home. History of CLL, thyroid malignancy, chronic bronchitis, former smoker. EXAM: CHEST  2 VIEW COMPARISON:  Chest x-ray of February 15, 2017 FINDINGS: The lungs are adequately inflated. The interstitial markings are coarse especially at the lung bases. There is no alveolar infiltrate or pleural effusion. The cardiac silhouette is top-normal in size and stable. There is calcification in the wall of the thoracic aorta. The mediastinum is normal in width. The bony thorax exhibits no acute abnormality. IMPRESSION: Chronic bronchitic changes, stable. No pneumonia, CHF, nor other acute cardiopulmonary abnormality. Thoracic aortic atherosclerosis. Electronically Signed   By: David  Martinique M.D.   On: 08/10/2017 11:23   Ct Head Wo Contrast  Result Date: 08/10/2017 CLINICAL DATA:  Pt here with c/o increased weakness since last Wednesday after getting flu shot at PCP. PCP sent here for evaluation. Pt denies any symptoms other than weakness EXAM: CT HEAD WITHOUT CONTRAST TECHNIQUE: Contiguous axial images were obtained from the base of the skull through the vertex without intravenous contrast. COMPARISON:  05/31/2013 FINDINGS: Brain: No evidence of acute infarction, hemorrhage, extra-axial collection, ventriculomegaly, or mass effect. Generalized cerebral atrophy. Periventricular white matter low attenuation likely secondary to microangiopathy. 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 Vascular: Cerebrovascular atherosclerotic calcifications are noted. Skull: Negative for fracture or focal lesion. Sinuses/Orbits: Visualized portions of the orbits are unremarkable. Visualized portions of the paranasal sinuses and mastoid air cells are unremarkable. Other: None. IMPRESSION: 1. No acute intracranial pathology. 2. Chronic microvascular disease and cerebral atrophy. Electronically Signed   By: Kathreen Devoid    On: 08/10/2017 11:55   Ct Angio Chest Pe W And/or Wo Contrast  Result Date: 08/10/2017 CLINICAL DATA:  Hypertension, high cholesterol, CLL. Shortness of breath. EXAM: CT ANGIOGRAPHY CHEST WITH CONTRAST TECHNIQUE: Multidetector CT imaging of the chest was performed using the standard protocol during bolus administration of intravenous contrast. Multiplanar CT image reconstructions and MIPs were obtained to evaluate the vascular anatomy. CONTRAST:  75 cc Isovue 370 IV COMPARISON:  10/04/2016 FINDINGS: Cardiovascular: No filling defects in the pulmonary arteries to suggestpulmonary emboli. Diffuse aortic calcifications. Mild aneurysmal dilatation of the ascending thoracic aorta, 4.3 cm. Heart is borderline in size. Scattered coronary artery calcifications. Mediastinum/Nodes: Marked bilateral axillary adenopathy, worsening since prior study. Index left axillary lymph node has a short axis diameter of 4.3 cm compared to 1.4 cm previously. Index right axillary lymph node has a short axis diameter of 2.5 cm compared to 1.1 cm previously. Borderline size hilar lymph nodes. Enlarged right  hilar lymph node measures 2 cm in short axis diameter. This compares to 1.5 cm previously. Mildly enlarged mediastinal lymph nodes. Prevascular lymph node has a short axis diameter of 10 mm, similar to prior study. Right paratracheal node has a short axis diameter of 10 mm, stable since prior study. Lungs/Pleura: Ground-glass opacities again noted in the upper lobes, likely scarring. Centrilobular emphysema. No confluent opacities or effusions. Upper Abdomen: Splenomegaly again partially imaged, likely increased since prior study. Suspect upper abdominal adenopathy in the region of the celiac axis/gastrohepatic ligament. Index lymph node measures 11 mm in short axis diameter in the gastrohepatic ligament, or increased since prior study. Musculoskeletal: Chest wall soft tissues are unremarkable. No acute bony abnormality. Review of the  MIP images confirms the above findings. IMPRESSION: Marked worsening of and bilateral axillary adenopathy. Stable mildly enlarged mediastinal and left hilar lymph nodes. Mild enlargement of right hilar lymph nodes. Enlarging upper abdominal lymph nodes and worsening splenomegaly. No evidence of pulmonary embolus. 4.3 cm ascending thoracic aortic aneurysm. Recommend annual imaging followup by CTA or MRA. This recommendation follows 2010 ACCF/AHA/AATS/ACR/ASA/SCA/SCAI/SIR/STS/SVM Guidelines for the Diagnosis and Management of Patients with Thoracic Aortic Disease. Circulation. 2010; 121: I338-S505 Aortic Atherosclerosis (ICD10-I70.0) and Emphysema (ICD10-J43.9). Electronically Signed   By: Rolm Baptise M.D.   On: 08/10/2017 13:44    Medications:  I have reviewed the patient's current medications. Scheduled: . albuterol  2.5 mg Nebulization TID  . amLODipine  2.5 mg Oral Daily  . budesonide (PULMICORT) nebulizer solution  0.25 mg Nebulization BID  . enoxaparin (LOVENOX) injection  40 mg Subcutaneous Q24H  . feeding supplement (ENSURE ENLIVE)  237 mL Oral BID BM  . gabapentin  400 mg Oral QHS  . guaiFENesin  600 mg Oral BID  . levothyroxine  150 mcg Oral QAC breakfast  . losartan  100 mg Oral Daily  . sodium chloride flush  3 mL Intravenous Q12H  . tiotropium  18 mcg Inhalation Daily    Assessment/Plan: Patient improved.  Continues to have pain when touching the lower extremities.  CK and lactic acid are normal.  TSH low.  Would do further thyroid testing.     LOS: 0 days   Alexis Goodell, MD Neurology [phone] 08/11/2017  10:47 AM

## 2017-08-11 NOTE — Discharge Summary (Signed)
[name] [facility] NAME: Lisa Figueroa    MR#:  [id number]  DATE OF BIRTH:  April 07, 1932  DATE OF ADMISSION:  08/10/2017 ADMITTING PHYSICIAN: Theodoro Grist, MD  DATE OF DISCHARGE: 08/11/2017  PRIMARY CARE PHYSICIAN: Crecencio Mc, MD    ADMISSION DIAGNOSIS:  weakness  DISCHARGE DIAGNOSIS:  Active Problems:   CLL (chronic lymphocytic leukemia) (HCC)   Acute bronchitis   Generalized weakness   Acute on chronic respiratory failure with hypoxia ([place])   SECONDARY DIAGNOSIS:   Past Medical History:  Diagnosis Date  . CLL (chronic lymphocytic leukemia) ([place])   . History of colon polyps   . History of shingles   . Hyperlipidemia   . Hypertension   . Post herpetic neuralgia   . Shingles   . Thyroid cancer ([place]) 1998  . Thyroid cancer [facility])     HOSPITAL COURSE:   83 year old female with past medical history of hypertension, hyperlipidemia, history of CLL, postherpetic neuralgia, shingles, thyroid cancer who presented to the hospital due to shortness of breath, weakness.  1. Acute on chronic respiratory failure with hypoxia-this was secondary to acute bronchitis. -Patient had a CT chest with contrast which was negative for pulmonary embolism, or Pneumonia.   - pt. Was treated for acute bronchitis with IV Levaquin. She was also given duo nebs, Pulmicort nebs. Her shortness of breath and hypoxia has improved.  2. Leukocytosis-this was secondary to his CLL and this can be further followed as an outpatient.  3. Acute bronchitis-this is the cause of patient's worsening respiratory failure. Patient is improved with IV Levaquin now being discharged on oral Levaquin.  4. Generalized weakness-patient was seen by physical therapy which did not recommend short-term rehabilitation but recommended home health services which is being arranged for the patient prior to discharge. There was also some concern for Guillain-Barr and therefore neurology  consultation was obtained which did not think that the patient had any symptoms related to that. Neurology did not recommend any further workup or intervention presently.  5. History of CLL-patient is followed by Dr. Grayland Ormond. Patient's white cell count is elevated, CT chest showed worsening lymphadenopathy. This was discussed with Dr. Grayland Ormond who will see the patient as an outpatient in next few weeks.  6. Essential hypertension-patient will resume her Norvasc, Losartan  7. Hypothyroidism - pt. Will cont. Her synthroid.   DISCHARGE CONDITIONS:   Stable.   CONSULTS OBTAINED:  Treatment Team:  Catarina Hartshorn, MD  DRUG ALLERGIES:   Allergies  Allergen Reactions  . Penicillin G Swelling    .Has patient had a PCN reaction causing immediate rash, facial/tongue/throat swelling, SOB or lightheadedness with hypotension: Yes Has patient had a PCN reaction causing severe rash involving mucus membranes or skin necrosis: Unknown Has patient had a PCN reaction that required hospitalization: Unknown Has patient had a PCN reaction occurring within the last 10 years: Unknown If all of the above answers are "NO", then may proceed with Cephalosporin use.   . Prednisone Other (See Comments)    crys and can't sleep crys and can't sleep    DISCHARGE MEDICATIONS:   Allergies as of 08/11/2017      Reactions   Penicillin G Swelling   .Has patient had a PCN reaction causing immediate rash, facial/tongue/throat swelling, SOB or lightheadedness with hypotension: Yes Has patient had a PCN reaction causing severe rash involving mucus membranes or skin necrosis: Unknown Has patient had a PCN reaction that required hospitalization: Unknown Has patient  had a PCN reaction occurring within the last 10 years: Unknown If all of the above answers are "NO", then may proceed with Cephalosporin use.   Prednisone Other (See Comments)   crys and can't sleep crys and can't sleep      Medication List    STOP  taking these medications   gabapentin 400 MG capsule Commonly known as:  NEURONTIN     TAKE these medications   acetaminophen 500 MG tablet Commonly known as:  TYLENOL Take 500 mg by mouth daily.   amLODipine 2.5 MG tablet Commonly known as:  NORVASC Take 1 tablet (2.5 mg total) by mouth daily.   feeding supplement (ENSURE ENLIVE) Liqd Take 237 mLs by mouth 2 (two) times daily between meals.   INCRUSE ELLIPTA 62.5 MCG/INH Aepb Generic drug:  umeclidinium bromide INHALE 1 PUFF INTO THE LUNGS ONCE A DAY   levofloxacin 250 MG tablet Commonly known as:  LEVAQUIN Take 1 tablet (250 mg total) by mouth daily.   levothyroxine 150 MCG tablet Commonly known as:  SYNTHROID, LEVOTHROID Take 1 tablet (150 mcg total) by mouth daily before breakfast. What changed:  See the new instructions.   losartan 100 MG tablet Commonly known as:  COZAAR Take 1 tablet (100 mg total) by mouth daily.   OXYGEN Inhale 2 L into the lungs. 2 liters @ night   Vitamin D 2000 units Caps Take by mouth daily.         DISCHARGE INSTRUCTIONS:   DIET:  Cardiac diet  DISCHARGE CONDITION:  Stable  ACTIVITY:  Activity as tolerated  OXYGEN:  Home Oxygen: Yes.     Oxygen Delivery: 2 liters/min via Patient connected to nasal cannula oxygen  DISCHARGE LOCATION:  home with Home health PT.   If you experience worsening of your admission symptoms, develop shortness of breath, life threatening emergency, suicidal or homicidal thoughts you must seek medical attention immediately by calling 911 or calling your MD immediately  if symptoms less severe.  You Must read complete instructions/literature along with all the possible adverse reactions/side effects for all the Medicines you take and that have been prescribed to you. Take any new Medicines after you have completely understood and accpet all the possible adverse reactions/side effects.   Please note  You were cared for by a hospitalist during  your hospital stay. If you have any questions about your discharge medications or the care you received while you were in the hospital after you are discharged, you can call the unit and asked to speak with the hospitalist on call if the hospitalist that took care of you is not available. Once you are discharged, your primary care physician will handle any further medical issues. Please note that NO REFILLS for any discharge medications will be authorized once you are discharged, as it is imperative that you return to your primary care physician (or establish a relationship with a primary care physician if you do not have one) for your aftercare needs so that they can reassess your need for medications and monitor your lab values.     Today   Shortness of breath, weakness improved.  No wheezing. WBC count stable. Seen by PT and they recommend Home health services.  Neurology does not think this is Guillian-Barre syndrome.   VITAL SIGNS:  Blood pressure 130/65, pulse 68, temperature 97.8 F (36.6 C), temperature source Oral, resp. rate 20, height 5\' 1"  (1.549 m), weight 72.7 kg (160 lb 4.8 oz), SpO2 97 %.  I/O:  Intake/Output Summary (Last 24 hours) at 08/11/17 1308 Last data filed at 08/11/17 1012  Gross per 24 hour  Intake             1477 ml  Output              400 ml  Net             1077 ml    PHYSICAL EXAMINATION:  GENERAL:  83 y.o.-year-old patient lying in the bed in no acute distress.  EYES: Pupils equal, round, reactive to light and accommodation. No scleral icterus. Extraocular muscles intact.  HEENT: Head atraumatic, normocephalic. Oropharynx and nasopharynx clear.  NECK:  Supple, no jugular venous distention. No thyroid enlargement, no tenderness.  LUNGS: Normal breath sounds bilaterally, minimal wheezing b/l, No rales,rhonchi. No use of accessory muscles of respiration.  CARDIOVASCULAR: S1, S2 normal. No murmurs, rubs, or gallops.  ABDOMEN: Soft, non-tender,  non-distended. Bowel sounds present. No organomegaly or mass.  EXTREMITIES: No pedal edema, cyanosis, or clubbing.  NEUROLOGIC: Cranial nerves II through XII are intact. No focal motor or sensory defecits b/l. Globally weak. PSYCHIATRIC: The patient is alert and oriented x 3.  SKIN: No obvious rash, lesion, or ulcer.   DATA REVIEW:   CBC  Recent Labs Lab 08/11/17 0244  WBC 36.6*  HGB 11.0*  HCT 34.3*  PLT 86*    Chemistries   Recent Labs Lab 08/10/17 1125 08/11/17 0244  NA 137 136  K 4.7 3.9  CL 95* 96*  CO2 34* 32  GLUCOSE 91 96  BUN 16 16  CREATININE 0.90 1.11*  CALCIUM 10.3 10.0  AST 32  --   ALT 22  --   ALKPHOS 48  --   BILITOT 0.7  --     Cardiac Enzymes  Recent Labs Lab 08/11/17 0244  TROPONINI <0.03    Microbiology Results  Results for orders placed or performed during the hospital encounter of 08/10/17  Blood culture (routine x 2)     Status: None (Preliminary result)   Collection Time: 08/10/17 11:25 AM  Result Value Ref Range Status   Specimen Description BLOOD RIGHT ANTECUBITAL  Final   Special Requests   Final    BOTTLES DRAWN AEROBIC AND ANAEROBIC Blood Culture adequate volume   Culture NO GROWTH < 24 HOURS  Final   Report Status PENDING  Incomplete  Urine Culture     Status: Abnormal   Collection Time: 08/10/17 11:29 AM  Result Value Ref Range Status   Specimen Description URINE, RANDOM  Final   Special Requests NONE  Final   Culture MULTIPLE SPECIES PRESENT, SUGGEST RECOLLECTION (A)  Final   Report Status 08/11/2017 FINAL  Final  Blood culture (routine x 2)     Status: None (Preliminary result)   Collection Time: 08/10/17 12:00 PM  Result Value Ref Range Status   Specimen Description BLOOD RIGHT ANTECUBITAL  Final   Special Requests   Final    BOTTLES DRAWN AEROBIC AND ANAEROBIC Blood Culture adequate volume   Culture NO GROWTH < 24 HOURS  Final   Report Status PENDING  Incomplete    RADIOLOGY:  Dg Chest 2 View  Result Date:  08/10/2017 CLINICAL DATA:  Increasing leg weakness over the past week. Difficulty moving the legs following a flu shot 1 week ago. On home oxygen with increased utilization at home. History of CLL, thyroid malignancy, chronic bronchitis, former smoker. EXAM: CHEST  2 VIEW COMPARISON:  Chest x-ray of February 15, 2017  FINDINGS: The lungs are adequately inflated. The interstitial markings are coarse especially at the lung bases. There is no alveolar infiltrate or pleural effusion. The cardiac silhouette is top-normal in size and stable. There is calcification in the wall of the thoracic aorta. The mediastinum is normal in width. The bony thorax exhibits no acute abnormality. IMPRESSION: Chronic bronchitic changes, stable. No pneumonia, CHF, nor other acute cardiopulmonary abnormality. Thoracic aortic atherosclerosis. Electronically Signed   By: David  Martinique M.D.   On: 08/10/2017 11:23   Ct Head Wo Contrast  Result Date: 08/10/2017 CLINICAL DATA:  Pt here with c/o increased weakness since last Wednesday after getting flu shot at PCP. PCP sent here for evaluation. Pt denies any symptoms other than weakness EXAM: CT HEAD WITHOUT CONTRAST TECHNIQUE: Contiguous axial images were obtained from the base of the skull through the vertex without intravenous contrast. COMPARISON:  05/31/2013 FINDINGS: Brain: No evidence of acute infarction, hemorrhage, extra-axial collection, ventriculomegaly, or mass effect. Generalized cerebral atrophy. Periventricular white matter low attenuation likely secondary to microangiopathy. 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 Vascular: Cerebrovascular atherosclerotic calcifications are noted. Skull: Negative for fracture or focal lesion. Sinuses/Orbits: Visualized portions of the orbits are unremarkable. Visualized portions of the paranasal sinuses and mastoid air cells are unremarkable. Other: None. IMPRESSION: 1. No acute intracranial pathology. 2. Chronic microvascular  disease and cerebral atrophy. Electronically Signed   By: Kathreen Devoid   On: 08/10/2017 11:55   Ct Angio Chest Pe W And/or Wo Contrast  Result Date: 08/10/2017 CLINICAL DATA:  Hypertension, high cholesterol, CLL. Shortness of breath. EXAM: CT ANGIOGRAPHY CHEST WITH CONTRAST TECHNIQUE: Multidetector CT imaging of the chest was performed using the standard protocol during bolus administration of intravenous contrast. Multiplanar CT image reconstructions and MIPs were obtained to evaluate the vascular anatomy. CONTRAST:  75 cc Isovue 370 IV COMPARISON:  10/04/2016 FINDINGS: Cardiovascular: No filling defects in the pulmonary arteries to suggestpulmonary emboli. Diffuse aortic calcifications. Mild aneurysmal dilatation of the ascending thoracic aorta, 4.3 cm. Heart is borderline in size. Scattered coronary artery calcifications. Mediastinum/Nodes: Marked bilateral axillary adenopathy, worsening since prior study. Index left axillary lymph node has a short axis diameter of 4.3 cm compared to 1.4 cm previously. Index right axillary lymph node has a short axis diameter of 2.5 cm compared to 1.1 cm previously. Borderline size hilar lymph nodes. Enlarged right hilar lymph node measures 2 cm in short axis diameter. This compares to 1.5 cm previously. Mildly enlarged mediastinal lymph nodes. Prevascular lymph node has a short axis diameter of 10 mm, similar to prior study. Right paratracheal node has a short axis diameter of 10 mm, stable since prior study. Lungs/Pleura: Ground-glass opacities again noted in the upper lobes, likely scarring. Centrilobular emphysema. No confluent opacities or effusions. Upper Abdomen: Splenomegaly again partially imaged, likely increased since prior study. Suspect upper abdominal adenopathy in the region of the celiac axis/gastrohepatic ligament. Index lymph node measures 11 mm in short axis diameter in the gastrohepatic ligament, or increased since prior study. Musculoskeletal: Chest wall  soft tissues are unremarkable. No acute bony abnormality. Review of the MIP images confirms the above findings. IMPRESSION: Marked worsening of and bilateral axillary adenopathy. Stable mildly enlarged mediastinal and left hilar lymph nodes. Mild enlargement of right hilar lymph nodes. Enlarging upper abdominal lymph nodes and worsening splenomegaly. No evidence of pulmonary embolus. 4.3 cm ascending thoracic aortic aneurysm. Recommend annual imaging followup by CTA or MRA. This recommendation follows 2010 ACCF/AHA/AATS/ACR/ASA/SCA/SCAI/SIR/STS/SVM Guidelines for the Diagnosis and  Management of Patients with Thoracic Aortic Disease. Circulation. 2010; 121: O350-K938 Aortic Atherosclerosis (ICD10-I70.0) and Emphysema (ICD10-J43.9). Electronically Signed   By: Rolm Baptise M.D.   On: 08/10/2017 13:44      Management plans discussed with the patient, family and they are in agreement.  CODE STATUS:     Code Status Orders        Start     Ordered   08/10/17 1441  Full code  Continuous     08/10/17 1440    Code Status History    Date Active Date Inactive Code Status Order ID Comments User Context   10/04/2016 11:01 PM 10/07/2016  4:16 PM Full Code [id number]  Lance Coon, MD Inpatient    Advance Directive Documentation     Most Recent Value  Type of Advance Directive  Healthcare Power of [place], Living will  Pre-existing out of facility DNR order (yellow form or pink MOST form)  -  "MOST" Form in Place?  -      TOTAL TIME TAKING CARE OF THIS PATIENT: 40 minutes.    Henreitta Leber M.D on 08/11/2017 at 1:08 PM  Between 7am to 6pm - Pager - [phone]  After 6pm go to [email or web address] - Proofreader  [facility]  Office  [phone]  CC: Primary care physician; Crecencio Mc, MD

## 2017-08-11 NOTE — Telephone Encounter (Signed)
Pt will discharge from [facility] on 08/11/17. Pt has been scheduled for 08/16/17.

## 2017-08-11 NOTE — Evaluation (Signed)
Occupational Therapy Evaluation Patient Details Name: Lisa Figueroa MRN: [id number] DOB: 03-06-1932 Today's Date: 08/11/2017    History of Present Illness Lisa Figueroa  is a 83 y.o. female with a known history of CLL and COPD, chronic respiratory failure, on oxygen at nighttime (2L/min), who presents to the hospital with weakness in lower extremities since about 1 week ago, when she received a flu shot. She complained of weakness in the lower extremities below her knees. She also admitted of hypoxia over the past one week, requiring using oxygen therapy at daytime as well, not only at night.  Due to possible pneumonia she was initiated on antibiotic therapy and hospitalist services was consulted for admission. She lives alone.   Clinical Impression   Pt seen for OT evaluation this date. Pt was generally independent at home for basic self care, using a SPC or RW for ambulation (RW more lately due to weakness), no falls in past 12 months, using home O2 at night, has someone come by to clean the house 1x/month, niece occasionally assists with food prep, and sister drives pt to appointments. Pt presents with deficits in cardiopulmonary status and activity tolerance, putting her more at risk for over exertion, falls, and increased need for assist with ADL tasks. Pt able to perform LB dressing tasks with min guard and noted slight SOB afterwards. Educated in body positioning and home/routines modifications to minimize SOB/bending/reaching too much. Also educated in pursed lip breathing to support SOB. O2 sats decreased to 91% after returning EOB from bathroom on room air, increasing immediately to >96% with 2L O2. Pt will benefit from skilled OT services to address noted impairments and functional deficits in order to maximize return to PLOF and minimize risk of falls/injury/rehospitalization.     Follow Up Recommendations  Home health OT    Equipment Recommendations  None recommended by OT     Recommendations for Other Services       Precautions / Restrictions Precautions Precautions: Fall Restrictions Weight Bearing Restrictions: No      Mobility Bed Mobility Overal bed mobility: Needs Assistance Bed Mobility: Supine to Sit;Sit to Supine     Supine to sit: Supervision Sit to supine: Supervision      Transfers Overall transfer level: Needs assistance Equipment used: 1 person hand held assist Transfers: Sit to/from Stand Sit to Stand: Min guard              Balance Overall balance assessment: No apparent balance deficits (not formally assessed);History of Falls                                         ADL either performed or assessed with clinical judgement   ADL Overall ADL's : Needs assistance/impaired Eating/Feeding: Sitting;Set up   Grooming: Wash/dry hands;Standing;Min guard   Upper Body Bathing: Sitting;Supervision/ safety;Set up   Lower Body Bathing: Sitting/lateral leans;Sit to/from stand;Min guard   Upper Body Dressing : Set up;Sitting   Lower Body Dressing: Min guard;Sitting/lateral leans;Sit to/from stand Lower Body Dressing Details (indicate cue type and reason): educated in body positioning to minimize need to bend over and cause SOB. Pt verbalized understanding. O2 sats on 2L O2 decreased from 96% to 91% after doff/donning socks seated and bending over. Toilet Transfer: Min guard;Ambulation;Regular Glass blower/designer Details (indicate cue type and reason): 1 person handheld assist, no LOB Toileting- Clothing Manipulation and Hygiene: Sitting/lateral lean;Independent  Functional mobility during ADLs: Min guard (handheld assist) General ADL Comments: limited by poor activity tolerance     Vision Baseline Vision/History: Wears glasses Wears Glasses: Reading only Patient Visual Report: No change from baseline Vision Assessment?: No apparent visual deficits     Perception     Praxis      Pertinent  Vitals/Pain Pain Assessment: No/denies pain     Hand Dominance Right   Extremity/Trunk Assessment Upper Extremity Assessment Upper Extremity Assessment: Overall WFL for tasks assessed (4/5 bilaterally, grossly WFL)   Lower Extremity Assessment Lower Extremity Assessment: Generalized weakness   Cervical / Trunk Assessment Cervical / Trunk Assessment: Normal   Communication Communication Communication: HOH   Cognition Arousal/Alertness: Awake/alert Behavior During Therapy: WFL for tasks assessed/performed Overall Cognitive Status: Within Functional Limits for tasks assessed                                     General Comments       Exercises Other Exercises Other Exercises: pt educated in pursed lip breathing and body/environmental positioning to minimize need for bending over/reaching too high or outside base of support, pt able to demonstrate good technique with occasional VC to use during ADL tasks.   Shoulder Instructions      Home Living Family/patient expects to be discharged to:: Private residence Living Arrangements: Alone Available Help at Discharge: Family;Other (Comment);Available PRN/intermittently (niece, sister available to support PRN) Type of Home: House Home Access: Level entry     Home Layout: One level     Bathroom Shower/Tub: Occupational psychologist: Handicapped height     Home Equipment: Environmental consultant - 2 wheels;Cane - single point;Grab bars - toilet;Grab bars - tub/shower;Other (comment);Shower seat (home O2 at night)          Prior Functioning/Environment Level of Independence: Needs assistance  Gait / Transfers Assistance Needed: Ambulates limited community distances with rolling walker. No falls ADL's / Homemaking Assistance Needed: Independent with ADLs, assist with IADLs from sister, niece and housekeeping. Drives very limited distances            OT Problem List: Decreased activity tolerance;Cardiopulmonary  status limiting activity;Decreased knowledge of use of DME or AE      OT Treatment/Interventions: Self-care/ADL training;Therapeutic exercise;Therapeutic activities;Energy conservation;DME and/or AE instruction;Patient/family education    OT Goals(Current goals can be found in the care plan section) Acute Rehab OT Goals Patient Stated Goal: go home OT Goal Formulation: With patient Time For Goal Achievement: 08/25/17 Potential to Achieve Goals: Good  OT Frequency: Min 1X/week   Barriers to D/C:            Co-evaluation              AM-PAC PT "6 Clicks" Daily Activity     Outcome Measure Help from another person eating meals?: None Help from another person taking care of personal grooming?: A Little Help from another person toileting, which includes using toliet, bedpan, or urinal?: A Little Help from another person bathing (including washing, rinsing, drying)?: A Little Help from another person to put on and taking off regular upper body clothing?: None Help from another person to put on and taking off regular lower body clothing?: A Little 6 Click Score: 20   End of Session Equipment Utilized During Treatment: Gait belt  Activity Tolerance: Patient tolerated treatment well Patient left: in bed;with call bell/phone within reach;with bed alarm set;Other (  comment) (PT in room to assess)  OT Visit Diagnosis: Other abnormalities of gait and mobility (R26.89)                Time: 6438-3818 OT Time Calculation (min): 56 min Charges:  OT General Charges $OT Visit: 1 Procedure OT Evaluation $OT Eval Low Complexity: 1 Procedure OT Treatments $Self Care/Home Management : 23-37 mins G-Codes: OT G-codes **NOT FOR INPATIENT CLASS** Functional Assessment Tool Used: AM-PAC 6 Clicks Daily Activity;Clinical judgement Functional Limitation: Self care Self Care Current Status (M0375): At least 20 percent but less than 40 percent impaired, limited or restricted Self Care Goal Status  (O3606): At least 1 percent but less than 20 percent impaired, limited or restricted   Jeni Salles, MPH, MS, OTR/L ascom [phone] 08/11/17, 11:48 AM

## 2017-08-11 NOTE — Care Management Note (Signed)
Case Management Note  Patient Details  Name: Lisa Figueroa MRN: [id number] Date of Birth: [birth year]  Subjective/Objective:      Admitted to [facility]  With the diagnosis of acute bronchitis. Sister is Donzetta Kohut [phone]). Dr. Lupita Dawn office last Monday. Prescriptions are filled at [facility] in [place].  Home Health in the past, doesn't remember name of agency. [facility] after hip repair. Home oxygen per [place] for the last 1.5 years. States she uses her oxygen at night 2 liters per nasal cannula. Rolling Walker, grab bars and raised toilet seat in the home. Takes care of all basic and instrumental activities of daily living herself, drives to church, No falls lately. Good appetite. Family/friend will transport.              Action/Plan: Physical and occupational therapy evaluations completed. Recommending home with Home Health and physical therapy. Discussed agencies. Chose [facility], will update representative. Also, gave [facility] information   Expected Discharge Date:  08/12/17               Expected Discharge Plan:     In-House Referral:     Discharge planning Services     Post Acute Care Choice:    Choice offered to:     DME Arranged:    DME Agency:     HH Arranged:   yes HH Agency:   [facility]  Status of Service:     If discussed at [place] of Stay Meetings, dates discussed:    Additional Comments:  Shelbie Ammons, [place] Management [phone] 08/11/2017, 10:41 AM

## 2017-08-11 NOTE — Evaluation (Addendum)
Physical Therapy Evaluation Patient Details Name: Lisa Figueroa MRN: [id number] DOB: 1932/06/08 Today's Date: 08/11/2017   History of Present Illness  Lisa Figueroa  is a 83 y.o. female with a known history of CLL and COPD, chronic respiratory failure, on oxygen at nighttime (2L/min), who presents to the hospital with weakness in lower extremities since about 1 week ago, when she received a flu shot. She complained of weakness in the lower extremities below her knees. She also admitted of hypoxia over the past one week, requiring using oxygen therapy at daytime as well, not only at night.  Due to possible pneumonia she was initiated on antibiotic therapy and hospitalist services was consulted for admission. She lives alone.  Clinical Impression  Pt admitted with above diagnosis. Pt currently with functional limitations due to the deficits listed below (see PT Problem List).  She continues to report generalized LE weakness. Pt is strong with MMT demonstrating at least 4+/5 strength throughout bilateral LE without focal weakness. Higher level strength deficits with UE support during sit to stand transfers. Reports intact sensation to light touch bilateral LEs. She is modified independent for bed mobility and CGA only for transfers and ambulation. She is able to ambulate a full lap around RN station with rolling walker. Two rest breaks provided due to intermittent fatigue but pt denies DOE. VSS throughout ambulation with SaO2 between 91-93% on room air. Continually monitored for signs of fatigue or SOB during ambulation. Pt would benefit from [facility] PT for general conditioning and higher level balance deficits especially in single leg stance. Pt will benefit from PT services to address deficits in strength, balance, and mobility in order to return to full function at home.      Follow Up Recommendations Home health PT    Equipment Recommendations  None recommended by PT;Other (comment) (Continue using  walker at discharge)    Recommendations for Other Services       Precautions / Restrictions Precautions Precautions: Fall Restrictions Weight Bearing Restrictions: No      Mobility  Bed Mobility Overal bed mobility: Modified Independent Bed Mobility: Supine to Sit;Sit to Supine     Supine to sit: Supervision Sit to supine: Supervision   General bed mobility comments: Pt demonstrates fair speed and sequencing with transfers. Steady once upright without UE support  Transfers Overall transfer level: Needs assistance Equipment used: Rolling walker (2 wheeled) Transfers: Sit to/from Stand Sit to Stand: Min guard         General transfer comment: Pt demonstrates fair speed and sequencing. Fair LE strength but pt does utilize bilateral UE for transfers  Ambulation/Gait Ambulation/Gait assistance: Min guard Ambulation Distance (Feet): 220 Feet Assistive device: Rolling walker (2 wheeled) Gait Pattern/deviations: Decreased step length - right;Decreased step length - left Gait velocity: Decreased but functional for limited community mobility Gait velocity interpretation: <1.8 ft/sec, indicative of risk for recurrent falls General Gait Details: Pt is able to ambulate a full lap around RN station with rolling walker. Two rest breaks provided due to intermittent fatigue but pt denies DOE. VSS throughout ambulation with SaO2 between 91-93% on room air. Continually monitored for signs of fatigue or SOB  Stairs            Wheelchair Mobility    Modified Rankin (Stroke Patients Only)       Balance Overall balance assessment: Needs assistance Sitting-balance support: No upper extremity supported Sitting balance-Leahy Scale: Good     Standing balance support: No upper extremity supported Standing balance-Leahy  Scale: Fair Standing balance comment: Able to maintain wide and narrow stance. Positive Rhomberg for increased sway but no LOB. Single leg balance is less than 1s  bilaterally. Pt should continue use of rolling walker a discharge                             Pertinent Vitals/Pain Pain Assessment: No/denies pain    Home Living Family/patient expects to be discharged to:: Private residence Living Arrangements: Alone Available Help at Discharge: Family;Other (Comment);Available PRN/intermittently (niece, sister available to support prn) Type of Home: House Home Access: Level entry     Home Layout: One level Home Equipment: Walker - 2 wheels;Cane - single point;Grab bars - toilet;Grab bars - tub/shower;Other (comment);Shower seat (Home O2 2L/min at night)      Prior Function Level of Independence: Needs assistance   Gait / Transfers Assistance Needed: Ambulates limited community distances with rolling walker. No falls  ADL's / Homemaking Assistance Needed: Independent with ADLs, assist with IADLs from sister, niece and housekeeping. Drives very limited distances        Hand Dominance   Dominant Hand: Right    Extremity/Trunk Assessment   Upper Extremity Assessment Upper Extremity Assessment: Defer to OT evaluation    Lower Extremity Assessment Lower Extremity Assessment: Overall WFL for tasks assessed (No )    Cervical / Trunk Assessment Cervical / Trunk Assessment: Normal  Communication   Communication: HOH  Cognition Arousal/Alertness: Awake/alert Behavior During Therapy: WFL for tasks assessed/performed Overall Cognitive Status: Within Functional Limits for tasks assessed                                        General Comments      Exercises Other Exercises Other Exercises: pt educated in pursed lip breathing and body/environmental positioning to minimize need for bending over/reaching too high or outside base of support, pt able to demonstrate good technique with occasional VC to use during ADL tasks.   Assessment/Plan    PT Assessment Patient needs continued PT services  PT Problem List  Decreased strength;Decreased balance;Decreased activity tolerance;Decreased mobility       PT Treatment Interventions DME instruction;Gait training;Therapeutic exercise;Therapeutic activities;Balance training;Neuromuscular re-education;Patient/family education    PT Goals (Current goals can be found in the Care Plan section)  Acute Rehab PT Goals Patient Stated Goal: Return to prior function at home PT Goal Formulation: With patient Time For Goal Achievement: 08/25/17 Potential to Achieve Goals: Good    Frequency Min 2X/week   Barriers to discharge        Co-evaluation               AM-PAC PT "6 Clicks" Daily Activity  Outcome Measure Difficulty turning over in bed (including adjusting bedclothes, sheets and blankets)?: None Difficulty moving from lying on back to sitting on the side of the bed? : None Difficulty sitting down on and standing up from a chair with arms (e.g., wheelchair, bedside commode, etc,.)?: None Help needed moving to and from a bed to chair (including a wheelchair)?: None Help needed walking in hospital room?: A Little Help needed climbing 3-5 steps with a railing? : A Little 6 Click Score: 22    End of Session Equipment Utilized During Treatment: Gait belt Activity Tolerance: Patient tolerated treatment well Patient left: in chair;with call bell/phone within reach;with chair alarm set;Other (comment) (  MD and SLP in room)   PT Visit Diagnosis: Muscle weakness (generalized) (M62.81);Unsteadiness on feet (R26.81)    Time: 4503-8882 PT Time Calculation (min) (ACUTE ONLY): 23 min   Charges:   PT Evaluation $PT Eval Low Complexity: 1 Low PT Treatments $Gait Training: 8-22 mins   PT G Codes:   PT G-Codes **NOT FOR INPATIENT CLASS** Functional Assessment Tool Used: AM-PAC 6 Clicks Basic Mobility Functional Limitation: Mobility: Walking and moving around Mobility: Walking and Moving Around Current Status (C0034): At least 20 percent but less than  40 percent impaired, limited or restricted Mobility: Walking and Moving Around Goal Status [phone]): At least 1 percent but less than 20 percent impaired, limited or restricted    Phillips Grout PT, DPT    Cortlandt Capuano 08/11/2017, 11:51 AM

## 2017-08-11 NOTE — Evaluation (Addendum)
Clinical/Bedside Swallow Evaluation Patient Details  Name: Lisa Figueroa MRN: [id number] Date of Birth: 18-Nov-1932  Today's Date: 08/11/2017 Time: SLP Start Time (ACUTE ONLY): 1000 SLP Stop Time (ACUTE ONLY): 1045 SLP Time Calculation (min) (ACUTE ONLY): 45 min  Past Medical History:  Past Medical History:  Diagnosis Date  . CLL (chronic lymphocytic leukemia) ([place])   . History of colon polyps   . History of shingles   . Hyperlipidemia   . Hypertension   . Post herpetic neuralgia   . Shingles   . Thyroid cancer ([place]) 1998  . Thyroid cancer [facility])    Past Surgical History:  Past Surgical History:  Procedure Laterality Date  . ABDOMINAL HYSTERECTOMY    . APPENDECTOMY    . BREAST SURGERY Right    cyst excision  . CARDIAC CATHETERIZATION  05/18/2008   At [facility]: mild 30% stenosis in LCX. normal EF.    Marland Kitchen CATARACT EXTRACTION W/PHACO Left 06/04/2015   Procedure: CATARACT EXTRACTION PHACO AND INTRAOCULAR LENS PLACEMENT (IOC);  Surgeon: Birder Robson, MD;  Location: [facility] ORS;  Service: Ophthalmology;  Laterality: Left;  [place] 00:34.1 AP% 23.0 CDE 7.85  . CHOLECYSTECTOMY    . HEMORRHOID SURGERY    . TOTAL THYROIDECTOMY  1998   HPI:   Pt is an 83  y.o. female with a known history of CLL and COPD, shingles, HTN, chronic respiratory failure, on oxygen at nighttime, who presents to the hospital with weakness in lower extremities since about 1 week ago, when she received a flu shot. She complained of weakness in the lower extremities below her knees. She also admitted of hypoxia over the past one week, requiring using oxygen therapy at daytime as well, not only at night. Patient was felt to have crackles on the right side of the lung by emergency room physician, possible pneumonia, she was initiated on antibiotic therapy, hospitalist services was consulted for admission. She lives alone, admits of having difficulty walking, even to the bathroom. Pt denies any difficulty swallowing and eats a  regular diet at home. She exhibited min SOB w/ any exertion per MD at admission.   Assessment / Plan / Recommendation Clinical Impression  Pt appears to present w/ reduced risk for aspiration when following general aspiration precautions; no increased SOB noted w/ solid foods, thin liquids via straw. Pt consumed po trials of thin liquids and regular food (graham crackers w/ ice cream) w/ no overt s/s of aspiration noted; no decline in vocal quality and respiratory effort appeared to remain calm as pt took her time w/ the po trials - also educated pt on not talking during oral intake. No oral phase deficits noted w/ po trials; adequate oral clearing noted.  Pt appears to present adequate oropharyngeal phase swallow function w/ po trials; following general aspiration precautions. Recommend a regular diet w/ thin liquids; pills w/ liquids or whole in a puree like applesauce if easier for swallowing1(as pt desires). No further skilled ST services indicated at this time. NSG consulted. Pt agreed. SLP Visit Diagnosis: Dysphagia, unspecified (R13.10)    Aspiration Risk   (reduced)    Diet Recommendation  Regular diet w/ thin liquids; general aspiration precautions to include rest breaks during meals as needed to avoid any increased SOB/WOB w/ exertion of the meal  Medication Administration: Whole meds with liquid (or w/ a puree if desired)    Other  Recommendations Recommended Consults:  (n/a ) Oral Care Recommendations: Oral care BID;Patient independent with oral care;Staff/trained caregiver to provide oral  care   Follow up Recommendations None      Frequency and Duration  n/a         Prognosis Prognosis for Safe Diet Advancement: Good Barriers to Reach Goals:  (none)      Swallow Study   General Date of Onset: 08/10/17 Type of Study: Bedside Swallow Evaluation Previous Swallow Assessment: none noted Diet Prior to this Study: Regular;Thin liquids (at home; modified upon admission to  SOB) Temperature Spikes Noted: No (wbc elevated abnormally) Respiratory Status: Nasal cannula (2 liters) History of Recent Intubation: No Behavior/Cognition: Alert;Cooperative;Pleasant mood;Distractible;Requires cueing (min) Oral Cavity Assessment: Within Functional Limits Oral Care Completed by SLP: Recent completion by staff Oral Cavity - Dentition: Adequate natural dentition (missing only a few) Vision: Functional for self-feeding Self-Feeding Abilities: Able to feed self;Needs set up (min) Patient Positioning: Upright in chair Baseline Vocal Quality: Normal Volitional Cough: Strong Volitional Swallow: Able to elicit    Oral/Motor/Sensory Function Overall Oral Motor/Sensory Function: Within functional limits   Ice Chips Ice chips: Not tested   Thin Liquid Thin Liquid: Within functional limits Presentation: Self Fed;Straw (~2 ozs)    Nectar Thick Nectar Thick Liquid: Not tested   Honey Thick Honey Thick Liquid: Not tested   Puree Puree: Within functional limits (3-4 trials) Presentation: Self Fed;Spoon   Solid   GO   Solid: Within functional limits Presentation: Self Fed (3  trials) Other Comments: "I can chew just fine"     Functional Assessment Tool Used: clinical judgement Functional Limitations: Swallowing Swallow Current Status (P9150): At least 1 percent but less than 20 percent impaired, limited or restricted Swallow Goal Status [phone]): At least 1 percent but less than 20 percent impaired, limited or restricted Swallow Discharge Status [phone]): At least 1 percent but less than 20 percent impaired, limited or restricted     Orinda Kenner, MS, CCC-SLP Leanda Padmore 08/11/2017,10:34 AM

## 2017-08-11 NOTE — Care Management Obs Status (Signed)
[place] NOTIFICATION   Patient Details  Name: Lisa Figueroa MRN: [id number] Date of Birth: March 29, 1932   Medicare Observation Status Notification Given:  Yes    Shelbie Ammons, RN 08/11/2017, 10:41 AM

## 2017-08-12 DIAGNOSIS — E785 Hyperlipidemia, unspecified: Secondary | ICD-10-CM | POA: Diagnosis not present

## 2017-08-12 DIAGNOSIS — J440 Chronic obstructive pulmonary disease with acute lower respiratory infection: Secondary | ICD-10-CM | POA: Diagnosis not present

## 2017-08-12 DIAGNOSIS — J9611 Chronic respiratory failure with hypoxia: Secondary | ICD-10-CM | POA: Diagnosis not present

## 2017-08-12 DIAGNOSIS — C9110 Chronic lymphocytic leukemia of B-cell type not having achieved remission: Secondary | ICD-10-CM | POA: Diagnosis not present

## 2017-08-12 DIAGNOSIS — Z85850 Personal history of malignant neoplasm of thyroid: Secondary | ICD-10-CM | POA: Diagnosis not present

## 2017-08-12 DIAGNOSIS — E039 Hypothyroidism, unspecified: Secondary | ICD-10-CM | POA: Diagnosis not present

## 2017-08-12 DIAGNOSIS — Z87891 Personal history of nicotine dependence: Secondary | ICD-10-CM | POA: Diagnosis not present

## 2017-08-12 DIAGNOSIS — B0222 Postherpetic trigeminal neuralgia: Secondary | ICD-10-CM | POA: Diagnosis not present

## 2017-08-12 DIAGNOSIS — I10 Essential (primary) hypertension: Secondary | ICD-10-CM | POA: Diagnosis not present

## 2017-08-12 DIAGNOSIS — Z9181 History of falling: Secondary | ICD-10-CM | POA: Diagnosis not present

## 2017-08-12 DIAGNOSIS — Z9981 Dependence on supplemental oxygen: Secondary | ICD-10-CM | POA: Diagnosis not present

## 2017-08-12 DIAGNOSIS — J209 Acute bronchitis, unspecified: Secondary | ICD-10-CM | POA: Diagnosis not present

## 2017-08-12 NOTE — Telephone Encounter (Signed)
Transition Care Management Follow-up Telephone Call  How have you been since you were released from the hospital? Patient stated she is feeling better since being home just weak.   Do you understand why you were in the hospital?Yes   Do you understand the discharge instrcutions?Yes  Items Reviewed:  Medications reviewed: Yes  Allergies reviewed:yes  Dietary changes reviewed: {yes  Referrals reviewed: yes   Functional Questionnaire:   Activities of Daily Living (ADLs):   She states they are independent in the following: Independent in ADLS, but patient is having generalized weakness at this time. States they require assistance with the following: Homehealth is coming into patient home and PT. Patient daughter is staying in the home with her at this time.   Any transportation issues/concerns?:No   Any patient concerns? No   Confirmed importance and date/time of follow-up visits scheduled: Yes   Confirmed with patient if condition begins to worsen call PCP or go to the ER.  Patient was given the Call-a-Nurse line [phone]: yes

## 2017-08-13 DIAGNOSIS — I10 Essential (primary) hypertension: Secondary | ICD-10-CM | POA: Diagnosis not present

## 2017-08-13 DIAGNOSIS — J440 Chronic obstructive pulmonary disease with acute lower respiratory infection: Secondary | ICD-10-CM | POA: Diagnosis not present

## 2017-08-13 DIAGNOSIS — B0222 Postherpetic trigeminal neuralgia: Secondary | ICD-10-CM | POA: Diagnosis not present

## 2017-08-13 DIAGNOSIS — J9611 Chronic respiratory failure with hypoxia: Secondary | ICD-10-CM | POA: Diagnosis not present

## 2017-08-13 DIAGNOSIS — C9110 Chronic lymphocytic leukemia of B-cell type not having achieved remission: Secondary | ICD-10-CM | POA: Diagnosis not present

## 2017-08-13 DIAGNOSIS — J209 Acute bronchitis, unspecified: Secondary | ICD-10-CM | POA: Diagnosis not present

## 2017-08-15 LAB — CULTURE, BLOOD (ROUTINE X 2)
CULTURE: NO GROWTH
Culture: NO GROWTH
SPECIAL REQUESTS: ADEQUATE
SPECIAL REQUESTS: ADEQUATE

## 2017-08-16 ENCOUNTER — Ambulatory Visit (INDEPENDENT_AMBULATORY_CARE_PROVIDER_SITE_OTHER): Payer: Medicare Other | Admitting: Internal Medicine

## 2017-08-16 ENCOUNTER — Encounter: Payer: Self-pay | Admitting: Internal Medicine

## 2017-08-16 VITALS — BP 98/66 | HR 76 | Temp 98.3°F | Resp 17 | Ht 61.0 in | Wt 159.6 lb

## 2017-08-16 DIAGNOSIS — Z09 Encounter for follow-up examination after completed treatment for conditions other than malignant neoplasm: Secondary | ICD-10-CM

## 2017-08-16 DIAGNOSIS — I10 Essential (primary) hypertension: Secondary | ICD-10-CM | POA: Diagnosis not present

## 2017-08-16 DIAGNOSIS — J9611 Chronic respiratory failure with hypoxia: Secondary | ICD-10-CM | POA: Diagnosis not present

## 2017-08-16 DIAGNOSIS — E890 Postprocedural hypothyroidism: Secondary | ICD-10-CM | POA: Diagnosis not present

## 2017-08-16 DIAGNOSIS — Z9981 Dependence on supplemental oxygen: Secondary | ICD-10-CM | POA: Diagnosis not present

## 2017-08-16 LAB — TSH: TSH: 0.56 u[IU]/mL (ref 0.35–4.50)

## 2017-08-16 MED ORDER — LOSARTAN POTASSIUM 100 MG PO TABS: 100.0000 mg | ORAL_TABLET | Freq: Every day | ORAL | 3 refills | Status: AC

## 2017-08-16 MED ORDER — GABAPENTIN 400 MG PO CAPS: 400.0000 mg | ORAL_CAPSULE | Freq: Two times a day (BID) | ORAL | 2 refills | Status: AC

## 2017-08-16 NOTE — Patient Instructions (Addendum)
Taking an antibiotic can create an imbalance in the normal population of bacteria that live in the small intestine.  This imbalance can persist for 3 months.   Taking a probiotic ( Align, Floraque or Culturelle), the generic version of one of these over the counter medications, or an alternative form (kombucha,  Yogurt, or another dietary source) for a minimum of 3 weeks may help prevent a serious antibiotic associated diarrhea  Called clostridium dificile colitis that occurs when the bacteria population is altered .  Taking a probiotic may also prevent vaginitis due to yeast infections and can be continued indefinitely if you feel that it improves your digestion or your elimination (bowels).     Suspend the amlodipine 2.5 mg and continue the losartan.    CHECK BP ONCE DAILY AT HOME.   CALL IFYOR BLOOD PRESSURE  STARTS TO BE > 140/90 OR LESS THAN 100/70

## 2017-08-16 NOTE — Progress Notes (Signed)
Subjective:  Patient ID: Lisa Figueroa, female    DOB: November 05, 1932  Age: 83 y.o. MRN: [id number]  CC: The primary encounter diagnosis was Postsurgical hypothyroidism. Diagnoses of Hospital discharge follow-up, Chronic respiratory failure with hypoxia, on home O2 therapy ([place]), and Essential hypertension were also pertinent to this visit.  HPI Lisa Figueroa presents for hospital follow up. Patient was admitted to [facility] on August 14th with acute on chronic hypoxia secondary to bronchitis,  Chest x ray was unchanged,  No infiltrate.   Her hypoxia improved with suportive care and She was discharged on August 15 on levaquin which she finished yesterday.  Not taking a  probiotic!  I have reviewed the records from the hospital admission in detail with patient today. TSh was supressed ,  No medication adjustment made/ Using 2 L 02 at home .  No longer coughing , energy level still low. Appetite diminished, has lost 4 lbs.       Hypotension>  Reviewed BP meds: .  Took amlodipine and losartan today   Outpatient Medications Prior to Visit  Medication Sig Dispense Refill  . acetaminophen (TYLENOL) 500 MG tablet Take 500 mg by mouth daily.    . Cholecalciferol (VITAMIN D) 2000 units CAPS Take by mouth daily.    . feeding supplement, ENSURE ENLIVE, (ENSURE ENLIVE) LIQD Take 237 mLs by mouth 2 (two) times daily between meals. 237 mL 12  . INCRUSE ELLIPTA 62.5 MCG/INH AEPB INHALE 1 PUFF INTO THE LUNGS ONCE A DAY 30 each 5  . levothyroxine (SYNTHROID, LEVOTHROID) 150 MCG tablet Take 1 tablet (150 mcg total) by mouth daily before breakfast.    . OXYGEN Inhale 2 L into the lungs. 2 liters @ night    . amLODipine (NORVASC) 2.5 MG tablet Take 1 tablet (2.5 mg total) by mouth daily. 90 tablet 3  . losartan (COZAAR) 100 MG tablet Take 1 tablet (100 mg total) by mouth daily. 90 tablet 3   No facility-administered medications prior to visit.     Review of Systems;  Patient denies headache, fevers,  malaise, unintentional weight loss, skin rash, eye pain, sinus congestion and sinus pain, sore throat, dysphagia,  hemoptysis , cough, dyspnea, wheezing, chest pain, palpitations, orthopnea, edema, abdominal pain, nausea, melena, diarrhea, constipation, flank pain, dysuria, hematuria, urinary  Frequency, nocturia, numbness, tingling, seizures,  Focal weakness, Loss of consciousness,  Tremor, insomnia, depression, anxiety, and suicidal ideation.      Objective:  BP 98/66 (BP Location: Left Arm, Patient Position: Sitting, Cuff Size: Normal)   Pulse 76   Temp 98.3 F (36.8 C) (Oral)   Resp 17   Ht 5\' 1"  (1.549 m)   Wt 159 lb 9.6 oz (72.4 kg)   SpO2 94%   BMI 30.16 kg/m   BP Readings from Last 3 Encounters:  08/16/17 98/66  08/11/17 130/65  08/10/17 118/62    Wt Readings from Last 3 Encounters:  08/16/17 159 lb 9.6 oz (72.4 kg)  08/11/17 160 lb 4.8 oz (72.7 kg)  08/10/17 163 lb 9.6 oz (74.2 kg)    General appearance: alert, cooperative and appears stated age Ears: normal TM's and external ear canals both ears Throat: lips, mucosa, and tongue normal; teeth and gums normal Neck: no adenopathy, no carotid bruit, supple, symmetrical, trachea midline and thyroid not enlarged, symmetric, no tenderness/mass/nodules Back: symmetric, no curvature. ROM normal. No CVA tenderness. Lungs: clear to auscultation bilaterally Heart: regular rate and rhythm, S1, S2 normal, no murmur, click, rub or gallop  Abdomen: soft, non-tender; bowel sounds normal; no masses,  no organomegaly Pulses: 2+ and symmetric Skin: Skin color, texture, turgor normal. No rashes or lesions Lymph nodes: Cervical, supraclavicular, and axillary nodes normal.  Lab Results  Component Value Date   HGBA1C 5.2 05/14/2015    Lab Results  Component Value Date   CREATININE 1.11 (H) 08/11/2017   CREATININE 0.90 08/10/2017   CREATININE 0.97 06/14/2017    Lab Results  Component Value Date   WBC 36.6 (H) 08/11/2017   HGB  11.0 (L) 08/11/2017   HCT 34.3 (L) 08/11/2017   PLT 86 (L) 08/11/2017   GLUCOSE 96 08/11/2017   CHOL 215 (H) 03/04/2017   TRIG 142.0 03/04/2017   HDL 43.40 03/04/2017   LDLCALC 143 (H) 03/04/2017   ALT 22 08/10/2017   AST 32 08/10/2017   NA 136 08/11/2017   K 3.9 08/11/2017   CL 96 (L) 08/11/2017   CREATININE 1.11 (H) 08/11/2017   BUN 16 08/11/2017   CO2 32 08/11/2017   TSH 0.56 08/16/2017   INR 1.0 06/27/2013   HGBA1C 5.2 05/14/2015   MICROALBUR 1.2 01/31/2016    Dg Chest 2 View  Result Date: 08/10/2017 CLINICAL DATA:  Increasing leg weakness over the past week. Difficulty moving the legs following a flu shot 1 week ago. On home oxygen with increased utilization at home. History of CLL, thyroid malignancy, chronic bronchitis, former smoker. EXAM: CHEST  2 VIEW COMPARISON:  Chest x-ray of February 15, 2017 FINDINGS: The lungs are adequately inflated. The interstitial markings are coarse especially at the lung bases. There is no alveolar infiltrate or pleural effusion. The cardiac silhouette is top-normal in size and stable. There is calcification in the wall of the thoracic aorta. The mediastinum is normal in width. The bony thorax exhibits no acute abnormality. IMPRESSION: Chronic bronchitic changes, stable. No pneumonia, CHF, nor other acute cardiopulmonary abnormality. Thoracic aortic atherosclerosis. Electronically Signed   By: David  Martinique M.D.   On: 08/10/2017 11:23   Ct Head Wo Contrast  Result Date: 08/10/2017 CLINICAL DATA:  Pt here with c/o increased weakness since last Wednesday after getting flu shot at PCP. PCP sent here for evaluation. Pt denies any symptoms other than weakness EXAM: CT HEAD WITHOUT CONTRAST TECHNIQUE: Contiguous axial images were obtained from the base of the skull through the vertex without intravenous contrast. COMPARISON:  05/31/2013 FINDINGS: Brain: No evidence of acute infarction, hemorrhage, extra-axial collection, ventriculomegaly, or mass effect.  Generalized cerebral atrophy. Periventricular white matter low attenuation likely secondary to microangiopathy. 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 Vascular: Cerebrovascular atherosclerotic calcifications are noted. Skull: Negative for fracture or focal lesion. Sinuses/Orbits: Visualized portions of the orbits are unremarkable. Visualized portions of the paranasal sinuses and mastoid air cells are unremarkable. Other: None. IMPRESSION: 1. No acute intracranial pathology. 2. Chronic microvascular disease and cerebral atrophy. Electronically Signed   By: Kathreen Devoid   On: 08/10/2017 11:55   Ct Angio Chest Pe W And/or Wo Contrast  Result Date: 08/10/2017 CLINICAL DATA:  Hypertension, high cholesterol, CLL. Shortness of breath. EXAM: CT ANGIOGRAPHY CHEST WITH CONTRAST TECHNIQUE: Multidetector CT imaging of the chest was performed using the standard protocol during bolus administration of intravenous contrast. Multiplanar CT image reconstructions and MIPs were obtained to evaluate the vascular anatomy. CONTRAST:  75 cc Isovue 370 IV COMPARISON:  10/04/2016 FINDINGS: Cardiovascular: No filling defects in the pulmonary arteries to suggestpulmonary emboli. Diffuse aortic calcifications. Mild aneurysmal dilatation of the ascending thoracic aorta, 4.3  cm. Heart is borderline in size. Scattered coronary artery calcifications. Mediastinum/Nodes: Marked bilateral axillary adenopathy, worsening since prior study. Index left axillary lymph node has a short axis diameter of 4.3 cm compared to 1.4 cm previously. Index right axillary lymph node has a short axis diameter of 2.5 cm compared to 1.1 cm previously. Borderline size hilar lymph nodes. Enlarged right hilar lymph node measures 2 cm in short axis diameter. This compares to 1.5 cm previously. Mildly enlarged mediastinal lymph nodes. Prevascular lymph node has a short axis diameter of 10 mm, similar to prior study. Right paratracheal node has a short  axis diameter of 10 mm, stable since prior study. Lungs/Pleura: Ground-glass opacities again noted in the upper lobes, likely scarring. Centrilobular emphysema. No confluent opacities or effusions. Upper Abdomen: Splenomegaly again partially imaged, likely increased since prior study. Suspect upper abdominal adenopathy in the region of the celiac axis/gastrohepatic ligament. Index lymph node measures 11 mm in short axis diameter in the gastrohepatic ligament, or increased since prior study. Musculoskeletal: Chest wall soft tissues are unremarkable. No acute bony abnormality. Review of the MIP images confirms the above findings. IMPRESSION: Marked worsening of and bilateral axillary adenopathy. Stable mildly enlarged mediastinal and left hilar lymph nodes. Mild enlargement of right hilar lymph nodes. Enlarging upper abdominal lymph nodes and worsening splenomegaly. No evidence of pulmonary embolus. 4.3 cm ascending thoracic aortic aneurysm. Recommend annual imaging followup by CTA or MRA. This recommendation follows 2010 ACCF/AHA/AATS/ACR/ASA/SCA/SCAI/SIR/STS/SVM Guidelines for the Diagnosis and Management of Patients with Thoracic Aortic Disease. Circulation. 2010; 121: R740-C144 Aortic Atherosclerosis (ICD10-I70.0) and Emphysema (ICD10-J43.9). Electronically Signed   By: Rolm Baptise M.D.   On: 08/10/2017 13:44    Assessment & Plan:   Problem List Items Addressed This Visit    Chronic respiratory failure with hypoxia, on home O2 therapy ([place])    Her acute failure has resolved and she is satting well on 2  02      Essential hypertension    She is hypotensive on current regimen,  I have suspended the amlodipine       Relevant Medications   losartan (COZAAR) 100 MG tablet   Hospital discharge follow-up    Patient is stable post discharge and has no new issues or questions about discharge plans at the visit today for hospital follow up. All labs , imaging studies and progress notes from admission were  reviewed with patient today        Postsurgical hypothyroidism - Primary    Repeat assessment of Thyroid function is WNL on current dose.  No current changes needed.   Lab Results  Component Value Date   TSH 0.56 08/16/2017         Relevant Orders   TSH (Completed)      I have discontinued Ms. Wahab's amLODipine. I have also changed her losartan and gabapentin. Additionally, I am having her maintain her acetaminophen, Vitamin D, feeding supplement (ENSURE ENLIVE), OXYGEN, INCRUSE ELLIPTA, and levothyroxine.  Meds ordered this encounter  Medications  . DISCONTD: gabapentin (NEURONTIN) 400 MG capsule    Sig: Take 400 mg by mouth 2 (two) times daily.    Refill:  2  . losartan (COZAAR) 100 MG tablet    Sig: Take 1 tablet (100 mg total) by mouth daily. FOR HYPERTENSION    Dispense:  90 tablet    Refill:  3    NO CHILD PROOF CAPS PLEASE  . gabapentin (NEURONTIN) 400 MG capsule    Sig: Take 1 capsule (400  mg total) by mouth 2 (two) times daily.    Dispense:  180 capsule    Refill:  2    NO CHILD PROOF CAPS PLEASE.   KEEP ON FILE FOR FUTURE REFILLS    Medications Discontinued During This Encounter  Medication Reason  . amLODipine (NORVASC) 2.5 MG tablet   . losartan (COZAAR) 100 MG tablet Reorder  . gabapentin (NEURONTIN) 400 MG capsule Reorder    Follow-up: Return in about 3 months (around 11/16/2017).   Crecencio Mc, MD

## 2017-08-17 ENCOUNTER — Telehealth: Payer: Self-pay | Admitting: Internal Medicine

## 2017-08-17 DIAGNOSIS — J440 Chronic obstructive pulmonary disease with acute lower respiratory infection: Secondary | ICD-10-CM | POA: Diagnosis not present

## 2017-08-17 DIAGNOSIS — B0222 Postherpetic trigeminal neuralgia: Secondary | ICD-10-CM | POA: Diagnosis not present

## 2017-08-17 DIAGNOSIS — C9110 Chronic lymphocytic leukemia of B-cell type not having achieved remission: Secondary | ICD-10-CM | POA: Diagnosis not present

## 2017-08-17 DIAGNOSIS — J209 Acute bronchitis, unspecified: Secondary | ICD-10-CM | POA: Diagnosis not present

## 2017-08-17 DIAGNOSIS — J9611 Chronic respiratory failure with hypoxia: Secondary | ICD-10-CM | POA: Diagnosis not present

## 2017-08-17 DIAGNOSIS — I10 Essential (primary) hypertension: Secondary | ICD-10-CM | POA: Diagnosis not present

## 2017-08-17 NOTE — Telephone Encounter (Signed)
See result note message 

## 2017-08-17 NOTE — Assessment & Plan Note (Signed)
Her acute failure has resolved and she is satting well on 2  02

## 2017-08-17 NOTE — Assessment & Plan Note (Signed)
Patient is stable post discharge and has no new issues or questions about discharge plans at the visit today for hospital follow up. All labs , imaging studies and progress notes from admission were reviewed with patient today   

## 2017-08-17 NOTE — Assessment & Plan Note (Signed)
Repeat assessment of Thyroid function is WNL on current dose.  No current changes needed.   Lab Results  Component Value Date   TSH 0.56 08/16/2017

## 2017-08-17 NOTE — Telephone Encounter (Signed)
Pt daughter Lisa Figueroa called back returning your call. Please advise, thank you!  Call [place] @ [phone]

## 2017-08-17 NOTE — Assessment & Plan Note (Signed)
She is hypotensive on current regimen,  I have suspended the amlodipine

## 2017-08-18 DIAGNOSIS — J9611 Chronic respiratory failure with hypoxia: Secondary | ICD-10-CM | POA: Diagnosis not present

## 2017-08-18 DIAGNOSIS — B0222 Postherpetic trigeminal neuralgia: Secondary | ICD-10-CM | POA: Diagnosis not present

## 2017-08-18 DIAGNOSIS — J209 Acute bronchitis, unspecified: Secondary | ICD-10-CM | POA: Diagnosis not present

## 2017-08-18 DIAGNOSIS — J440 Chronic obstructive pulmonary disease with acute lower respiratory infection: Secondary | ICD-10-CM | POA: Diagnosis not present

## 2017-08-18 DIAGNOSIS — I10 Essential (primary) hypertension: Secondary | ICD-10-CM | POA: Diagnosis not present

## 2017-08-18 DIAGNOSIS — C9110 Chronic lymphocytic leukemia of B-cell type not having achieved remission: Secondary | ICD-10-CM | POA: Diagnosis not present

## 2017-08-19 ENCOUNTER — Telehealth: Payer: Self-pay | Admitting: Internal Medicine

## 2017-08-19 DIAGNOSIS — B0222 Postherpetic trigeminal neuralgia: Secondary | ICD-10-CM | POA: Diagnosis not present

## 2017-08-19 DIAGNOSIS — J440 Chronic obstructive pulmonary disease with acute lower respiratory infection: Secondary | ICD-10-CM | POA: Diagnosis not present

## 2017-08-19 DIAGNOSIS — J209 Acute bronchitis, unspecified: Secondary | ICD-10-CM | POA: Diagnosis not present

## 2017-08-19 DIAGNOSIS — J9611 Chronic respiratory failure with hypoxia: Secondary | ICD-10-CM | POA: Diagnosis not present

## 2017-08-19 DIAGNOSIS — I10 Essential (primary) hypertension: Secondary | ICD-10-CM | POA: Diagnosis not present

## 2017-08-19 DIAGNOSIS — C9110 Chronic lymphocytic leukemia of B-cell type not having achieved remission: Secondary | ICD-10-CM | POA: Diagnosis not present

## 2017-08-19 NOTE — Telephone Encounter (Signed)
Dawn [phone] called from [facility] home health regarding pt status post antibiotic four days continue with non productive cough crackles to right middle lobe. Please advise? Thank you!

## 2017-08-20 ENCOUNTER — Telehealth: Payer: Self-pay | Admitting: Internal Medicine

## 2017-08-20 NOTE — Telephone Encounter (Signed)
Please advise 

## 2017-08-20 NOTE — Telephone Encounter (Signed)
Pt caregiver called to speak with your. She states that it is in regards to a pulse ox. Please advise, thank you!  Call [place] @ [phone]

## 2017-08-20 NOTE — Telephone Encounter (Signed)
If she is not coughing,  Not short of breath, and not febrile,  She does not need more antibiotics.

## 2017-08-20 NOTE — Telephone Encounter (Addendum)
Home health nurse was concerned that patient completed antibiotic 4 days ago and still has crackles in the right middle lobe. Pt has non productive cough. O2 is staying approx 97% 2L and 90-92% on room air. BP was 102/58. Patient is not having any other symptoms according to home health nurse. I called the patient and she says that she feels a lot better since completing ABT. Pt states she is not coughing. Please advise.

## 2017-08-20 NOTE — Telephone Encounter (Signed)
Pulse ordered.

## 2017-08-23 DIAGNOSIS — I10 Essential (primary) hypertension: Secondary | ICD-10-CM | POA: Diagnosis not present

## 2017-08-23 DIAGNOSIS — J440 Chronic obstructive pulmonary disease with acute lower respiratory infection: Secondary | ICD-10-CM | POA: Diagnosis not present

## 2017-08-23 DIAGNOSIS — B0222 Postherpetic trigeminal neuralgia: Secondary | ICD-10-CM | POA: Diagnosis not present

## 2017-08-23 DIAGNOSIS — J9611 Chronic respiratory failure with hypoxia: Secondary | ICD-10-CM | POA: Diagnosis not present

## 2017-08-23 DIAGNOSIS — J209 Acute bronchitis, unspecified: Secondary | ICD-10-CM | POA: Diagnosis not present

## 2017-08-23 DIAGNOSIS — C9110 Chronic lymphocytic leukemia of B-cell type not having achieved remission: Secondary | ICD-10-CM | POA: Diagnosis not present

## 2017-08-25 DIAGNOSIS — I10 Essential (primary) hypertension: Secondary | ICD-10-CM | POA: Diagnosis not present

## 2017-08-25 DIAGNOSIS — C9110 Chronic lymphocytic leukemia of B-cell type not having achieved remission: Secondary | ICD-10-CM | POA: Diagnosis not present

## 2017-08-25 DIAGNOSIS — J209 Acute bronchitis, unspecified: Secondary | ICD-10-CM | POA: Diagnosis not present

## 2017-08-25 DIAGNOSIS — J440 Chronic obstructive pulmonary disease with acute lower respiratory infection: Secondary | ICD-10-CM | POA: Diagnosis not present

## 2017-08-25 DIAGNOSIS — J9611 Chronic respiratory failure with hypoxia: Secondary | ICD-10-CM | POA: Diagnosis not present

## 2017-08-25 DIAGNOSIS — B0222 Postherpetic trigeminal neuralgia: Secondary | ICD-10-CM | POA: Diagnosis not present

## 2017-08-26 DIAGNOSIS — J209 Acute bronchitis, unspecified: Secondary | ICD-10-CM | POA: Diagnosis not present

## 2017-08-26 DIAGNOSIS — J9611 Chronic respiratory failure with hypoxia: Secondary | ICD-10-CM | POA: Diagnosis not present

## 2017-08-26 DIAGNOSIS — B0222 Postherpetic trigeminal neuralgia: Secondary | ICD-10-CM | POA: Diagnosis not present

## 2017-08-26 DIAGNOSIS — J440 Chronic obstructive pulmonary disease with acute lower respiratory infection: Secondary | ICD-10-CM | POA: Diagnosis not present

## 2017-08-26 DIAGNOSIS — C9110 Chronic lymphocytic leukemia of B-cell type not having achieved remission: Secondary | ICD-10-CM | POA: Diagnosis not present

## 2017-08-26 DIAGNOSIS — I10 Essential (primary) hypertension: Secondary | ICD-10-CM | POA: Diagnosis not present

## 2017-08-27 ENCOUNTER — Telehealth: Payer: Self-pay | Admitting: Internal Medicine

## 2017-08-27 DIAGNOSIS — J440 Chronic obstructive pulmonary disease with acute lower respiratory infection: Secondary | ICD-10-CM | POA: Diagnosis not present

## 2017-08-27 DIAGNOSIS — C9110 Chronic lymphocytic leukemia of B-cell type not having achieved remission: Secondary | ICD-10-CM | POA: Diagnosis not present

## 2017-08-27 DIAGNOSIS — J9611 Chronic respiratory failure with hypoxia: Secondary | ICD-10-CM | POA: Diagnosis not present

## 2017-08-27 DIAGNOSIS — J209 Acute bronchitis, unspecified: Secondary | ICD-10-CM | POA: Diagnosis not present

## 2017-08-27 DIAGNOSIS — B0222 Postherpetic trigeminal neuralgia: Secondary | ICD-10-CM | POA: Diagnosis not present

## 2017-08-27 DIAGNOSIS — I10 Essential (primary) hypertension: Secondary | ICD-10-CM | POA: Diagnosis not present

## 2017-08-27 NOTE — Progress Notes (Signed)
Lisa Figueroa  [phone]  Fax:([phone]) [place] DOB: 1932/11/20  MR#: [id number]  IWP#:[id number]  Patient Care Team: Crecencio Mc, MD as PCP - General (Internal Medicine) [name], MD as Consulting Physician (General Surgery)  CHIEF COMPLAINT: CLL  INTERVAL HISTORY:  Patient returns to clinic today for repeat laboratory work and hospital follow-up. She was recently admitted to hospital with acute bronchitis and possible pneumonia. She continues to have significant shortness of breath requiring oxygen 24 hours a day. She has persistent weakness and fatigue. She denies any fevers, chills, night sweats, or weight loss. She has no neurologic complaints. She denies any chest pain, cough, or hemoptysis. Patient denies any nausea, vomiting, constipation, or diarrhea. She has no urinary complaints. Patient otherwise feels well and offers no further specific complaints.   REVIEW OF SYSTEMS:   Review of Systems  Constitutional: Positive for malaise/fatigue. Negative for chills, fever and weight loss.  Respiratory: Positive for shortness of breath. Negative for cough and sputum production.   Cardiovascular: Negative.  Negative for chest pain and leg swelling.  Gastrointestinal: Negative.  Negative for abdominal pain, nausea and vomiting.  Genitourinary: Negative.   Musculoskeletal: Negative.   Neurological: Positive for weakness. Negative for sensory change.  Psychiatric/Behavioral: The patient has insomnia. The patient is not nervous/anxious.     As per HPI. Otherwise, a complete review of systems is negative.  ONCOLOGY HISTORY: Oncology History    1. Monoclonal population of lymphocytes.  Lymphoproliferative disease. 83 year old lady who had an abnormal mammogram mid axillary lymphadenopathy core biopsies which he suggested a lymphoproliferative disease positive staining for CD5 and CD20.     CLL (chronic lymphocytic leukemia)  ([place]) (Resolved)   11/18/2014 Initial Diagnosis    CLL (chronic lymphocytic leukemia)       PAST MEDICAL HISTORY: Past Medical History:  Diagnosis Date  . CLL (chronic lymphocytic leukemia) ([place])   . History of colon polyps   . History of shingles   . Hyperlipidemia   . Hypertension   . Post herpetic neuralgia   . Shingles   . Thyroid cancer ([place]) 1998  . Thyroid cancer ([place])     PAST SURGICAL HISTORY: Past Surgical History:  Procedure Laterality Date  . ABDOMINAL HYSTERECTOMY    . APPENDECTOMY    . BREAST SURGERY Right    cyst excision  . CARDIAC CATHETERIZATION  05/18/2008   At [facility]: mild 30% stenosis in LCX. normal EF.    [facility] CATARACT EXTRACTION W/PHACO Left 06/04/2015   Procedure: CATARACT EXTRACTION PHACO AND INTRAOCULAR LENS PLACEMENT (IOC);  Surgeon: Birder Robson, MD;  Location: [facility] ORS;  Service: Ophthalmology;  Laterality: Left;  [place] 00:34.1 AP% 23.0 CDE 7.85  . CHOLECYSTECTOMY    . HEMORRHOID SURGERY    . TOTAL THYROIDECTOMY  1998    FAMILY HISTORY Family History  Problem Relation Age of Onset  . Cancer Brother        prostate and bone  . Heart disease Mother   . Heart disease Father   . Cancer Maternal Aunt        breast  . Cancer Cousin        breast - maternal side    GYNECOLOGIC HISTORY:  No LMP recorded. Patient has had a hysterectomy.     ADVANCED DIRECTIVES:    HEALTH MAINTENANCE: Social History  Substance Use Topics  . Smoking status: Former Smoker    Packs/day: 1.00    Years: 20.00  Quit date: 08/08/1984  . Smokeless tobacco: Never Used  . Alcohol use 0.0 oz/week     Comment: wine glass daily     Colonoscopy:  PAP:  Bone density:March 2017  Mammogram:May 2016  Allergies  Allergen Reactions  . Penicillin G Swelling    .Has patient had a PCN reaction causing immediate rash, facial/tongue/throat swelling, SOB or lightheadedness with hypotension: Yes Has patient had a PCN reaction causing severe rash involving mucus  membranes or skin necrosis: Unknown Has patient had a PCN reaction that required hospitalization: Unknown Has patient had a PCN reaction occurring within the last 10 years: Unknown If all of the above answers are "NO", then may proceed with Cephalosporin use.   . Prednisone Other (See Comments)    crys and can't sleep crys and can't sleep    Current Outpatient Prescriptions  Medication Sig Dispense Refill  . acetaminophen (TYLENOL) 500 MG tablet Take 500 mg by mouth daily.    . Cholecalciferol (VITAMIN D) 2000 units CAPS Take by mouth daily.    . feeding supplement, ENSURE ENLIVE, (ENSURE ENLIVE) LIQD Take 237 mLs by mouth 2 (two) times daily between meals. 237 mL 12  . gabapentin (NEURONTIN) 400 MG capsule Take 1 capsule (400 mg total) by mouth 2 (two) times daily. 180 capsule 2  . INCRUSE ELLIPTA 62.5 MCG/INH AEPB INHALE 1 PUFF INTO THE LUNGS ONCE A DAY 30 each 5  . levothyroxine (SYNTHROID, LEVOTHROID) 150 MCG tablet Take 1 tablet (150 mcg total) by mouth daily before breakfast.    . losartan (COZAAR) 100 MG tablet Take 1 tablet (100 mg total) by mouth daily. FOR HYPERTENSION 90 tablet 3  . OXYGEN Inhale 2 L into the lungs. 2 liters @ night     No current facility-administered medications for this visit.     OBJECTIVE: BP 113/74   Pulse 71   Temp 98.4 F (36.9 C) (Tympanic)   Resp 20   Wt 158 lb (71.7 kg)   BMI 29.85 kg/m    Body mass index is 29.85 kg/m.    ECOG FS:1 - Symptomatic but completely ambulatory  General: Well-developed, well-nourished, no acute distress. Eyes: Pink conjunctiva, anicteric sclera. Lungs: Clear to auscultation bilaterally. Heart: Regular rate and rhythm. No rubs, murmurs, or gallops. Abdomen: Soft, nontender, nondistended. No organomegaly noted, normoactive bowel sounds. Musculoskeletal: No edema, cyanosis, or clubbing. Neuro: Alert, answering all questions appropriately. Cranial nerves grossly intact. Skin: No rashes or petechiae noted. Psych:  Normal affect. Lymphatics: No palpable lymphadenopathy.  LAB RESULTS:  Appointment on 09/01/2017  Component Date Value Ref Range Status  . WBC 09/01/2017 58.0* 3.6 - 11.0 K/uL Final   Comment: [facility] CRITICAL VALUE PROTOCOL RESULT REPEATED AND VERIFIED   . RBC 09/01/2017 4.40  3.80 - 5.20 MIL/uL Final  . Hemoglobin 09/01/2017 11.9* 12.0 - 16.0 g/dL Final  . HCT 09/01/2017 36.8  35.0 - 47.0 % Final  . MCV 09/01/2017 83.6  80.0 - 100.0 fL Final  . MCH 09/01/2017 27.0  26.0 - 34.0 pg Final  . MCHC 09/01/2017 32.3  32.0 - 36.0 g/dL Final  . RDW 09/01/2017 14.7* 11.5 - 14.5 % Final  . Platelets 09/01/2017 99* 150 - 440 K/uL Final  . Neutrophils Relative % 09/01/2017 11  % Final  . Lymphocytes Relative 09/01/2017 70  % Final  . Monocytes Relative 09/01/2017 0  % Final  . Eosinophils Relative 09/01/2017 0  % Final  . Basophils Relative 09/01/2017 0  % Final  .  Other 09/01/2017 19  % Final   Comment: BLAST NOTED ON SMEAR.  PENDING PATHOLOGIST REVIEW [facility] CALLED TO DR. Dicie Beam @ 11:35AM 09/01/2017 [facility]   . Neutro Abs 09/01/2017 6.4  1.4 - 6.5 K/uL Final  . Lymphs Abs 09/01/2017 40.6* 1.0 - 3.6 K/uL Final  . Monocytes Absolute 09/01/2017 0.0* 0.2 - 0.9 K/uL Final  . Eosinophils Absolute 09/01/2017 0.0  0 - 0.7 K/uL Final  . Basophils Absolute 09/01/2017 0.0  0 - 0.1 K/uL Final  . WBC Morphology 09/01/2017 SMUDGE CELLS   Final  . Smear Review 09/01/2017 PATH REVIEW HAS BEEN ORDERED ON THIS ACCESSION NUMBER   Final   Comment: MANUAL DIFF  PLATELETS APPEAR DECREASED     STUDIES: No results found.  ASSESSMENT:  Chronic lymphocytic leukemia, Rai stage 1.  PLAN:    1. CLL: Diagnosis previously confirmed her peripheral blood flow cytometry. Patient's white blood cell count is increased to 58, which is above her baseline between 23 and 43 since November 2016. She has a mild thrombocytopenia as well which is unchanged. CT scan results noted with worsening lymphadenopathy, but is unclear  if this is related to her acute bronchitis or progression of disease. No intervention is needed at this time, but patient may require treatment with single agent Rituxan in the future. Return to clinic in 3 months with repeat laboratory work and further evaluation.  2. Shortness breath: Continue oxygen as prescribed. 3. Hypertension: Patient's blood pressure is within normal limits today. Continue monitoring her treatment per primary care. 4. Lymphadenopathy: Unclear if related to underlying CLL or possibly recent bout acute bronchitis. Monitor. 5. Thrombocytopenia: Possibly related to underlying CLL, monitor.  Patient expressed understanding and was in agreement with this plan. She also understands that She can call clinic at any time with any questions, concerns, or complaints.    Lloyd Huger, MD   09/01/2017 12:55 PM

## 2017-08-27 NOTE — Telephone Encounter (Signed)
Non urgent, can wait for your return.   Spoke with Sherlynn Stalls from [place] and gave her the verbal orders for the medical social worker and the OT. Sherlynn Stalls also stated that the pt's bp was 90/60 upon her arrival today with the pt. Sherlynn Stalls stated that she was able to get the pt to drink some water and by the time she got ready to leave the pt's bp was up to 120/60. She also stated that everytime she goes to see the pt her O2 level is 88% and she will ask the pt why she isn't wearing her oxygen and the pt's response is "I was told I didn't have to wear it all the time." Sherlynn Stalls stated that the sister was there today and she stated that she fights with the pt to get her to eat or drink anything and she has noticed that she has been taking the wrong days medication. Sherlynn Stalls stated that she performed a mini mental and the pt scored 22 out of 26.

## 2017-08-27 NOTE — Telephone Encounter (Signed)
Lisa Figueroa [phone] called from [facility] regarding verbal order for a medical social worker for September 13. That's when her daughter can come up from [place] it's community resources. Pt is not eating not drinking bp is low. Lisa Figueroa is questioning if she can live by herself. Requesting OT for 1 week two visits. VM can be left. Thank you!

## 2017-08-30 DIAGNOSIS — J440 Chronic obstructive pulmonary disease with acute lower respiratory infection: Secondary | ICD-10-CM | POA: Diagnosis not present

## 2017-08-30 DIAGNOSIS — I10 Essential (primary) hypertension: Secondary | ICD-10-CM | POA: Diagnosis not present

## 2017-08-30 DIAGNOSIS — B0222 Postherpetic trigeminal neuralgia: Secondary | ICD-10-CM | POA: Diagnosis not present

## 2017-08-30 DIAGNOSIS — C9110 Chronic lymphocytic leukemia of B-cell type not having achieved remission: Secondary | ICD-10-CM | POA: Diagnosis not present

## 2017-08-30 DIAGNOSIS — J9611 Chronic respiratory failure with hypoxia: Secondary | ICD-10-CM | POA: Diagnosis not present

## 2017-08-30 DIAGNOSIS — J209 Acute bronchitis, unspecified: Secondary | ICD-10-CM | POA: Diagnosis not present

## 2017-09-01 ENCOUNTER — Other Ambulatory Visit: Payer: Self-pay | Admitting: *Deleted

## 2017-09-01 ENCOUNTER — Inpatient Hospital Stay (HOSPITAL_BASED_OUTPATIENT_CLINIC_OR_DEPARTMENT_OTHER): Payer: Medicare Other | Admitting: Oncology

## 2017-09-01 ENCOUNTER — Inpatient Hospital Stay: Payer: Medicare Other | Attending: Oncology

## 2017-09-01 VITALS — BP 113/74 | HR 71 | Temp 98.4°F | Resp 20 | Wt 158.0 lb

## 2017-09-01 DIAGNOSIS — R531 Weakness: Secondary | ICD-10-CM

## 2017-09-01 DIAGNOSIS — Z79899 Other long term (current) drug therapy: Secondary | ICD-10-CM | POA: Diagnosis not present

## 2017-09-01 DIAGNOSIS — Z87891 Personal history of nicotine dependence: Secondary | ICD-10-CM

## 2017-09-01 DIAGNOSIS — Z808 Family history of malignant neoplasm of other organs or systems: Secondary | ICD-10-CM | POA: Insufficient documentation

## 2017-09-01 DIAGNOSIS — D696 Thrombocytopenia, unspecified: Secondary | ICD-10-CM

## 2017-09-01 DIAGNOSIS — Z86010 Personal history of colonic polyps: Secondary | ICD-10-CM | POA: Diagnosis not present

## 2017-09-01 DIAGNOSIS — C9190 Lymphoid leukemia, unspecified not having achieved remission: Secondary | ICD-10-CM

## 2017-09-01 DIAGNOSIS — Z803 Family history of malignant neoplasm of breast: Secondary | ICD-10-CM | POA: Insufficient documentation

## 2017-09-01 DIAGNOSIS — Z8042 Family history of malignant neoplasm of prostate: Secondary | ICD-10-CM | POA: Insufficient documentation

## 2017-09-01 DIAGNOSIS — B0222 Postherpetic trigeminal neuralgia: Secondary | ICD-10-CM | POA: Diagnosis not present

## 2017-09-01 DIAGNOSIS — Z85850 Personal history of malignant neoplasm of thyroid: Secondary | ICD-10-CM

## 2017-09-01 DIAGNOSIS — R0602 Shortness of breath: Secondary | ICD-10-CM | POA: Insufficient documentation

## 2017-09-01 DIAGNOSIS — J209 Acute bronchitis, unspecified: Secondary | ICD-10-CM | POA: Diagnosis not present

## 2017-09-01 DIAGNOSIS — C9110 Chronic lymphocytic leukemia of B-cell type not having achieved remission: Secondary | ICD-10-CM | POA: Insufficient documentation

## 2017-09-01 DIAGNOSIS — J9611 Chronic respiratory failure with hypoxia: Secondary | ICD-10-CM | POA: Diagnosis not present

## 2017-09-01 DIAGNOSIS — R5383 Other fatigue: Secondary | ICD-10-CM | POA: Insufficient documentation

## 2017-09-01 DIAGNOSIS — E785 Hyperlipidemia, unspecified: Secondary | ICD-10-CM

## 2017-09-01 DIAGNOSIS — Z8619 Personal history of other infectious and parasitic diseases: Secondary | ICD-10-CM | POA: Insufficient documentation

## 2017-09-01 DIAGNOSIS — I10 Essential (primary) hypertension: Secondary | ICD-10-CM | POA: Insufficient documentation

## 2017-09-01 DIAGNOSIS — R599 Enlarged lymph nodes, unspecified: Secondary | ICD-10-CM | POA: Insufficient documentation

## 2017-09-01 DIAGNOSIS — J440 Chronic obstructive pulmonary disease with acute lower respiratory infection: Secondary | ICD-10-CM | POA: Diagnosis not present

## 2017-09-01 LAB — CBC WITH DIFFERENTIAL/PLATELET
BASOS ABS: 0 10*3/uL (ref 0–0.1)
BASOS PCT: 0 %
EOS PCT: 0 %
Eosinophils Absolute: 0 10*3/uL (ref 0–0.7)
HEMATOCRIT: 36.8 % (ref 35.0–47.0)
Hemoglobin: 11.9 g/dL — ABNORMAL LOW (ref 12.0–16.0)
LYMPHS PCT: 70 %
Lymphs Abs: 40.6 10*3/uL — ABNORMAL HIGH (ref 1.0–3.6)
MCH: 27 pg (ref 26.0–34.0)
MCHC: 32.3 g/dL (ref 32.0–36.0)
MCV: 83.6 fL (ref 80.0–100.0)
MONOS PCT: 0 %
Monocytes Absolute: 0 10*3/uL — ABNORMAL LOW (ref 0.2–0.9)
NEUTROS PCT: 11 %
Neutro Abs: 6.4 10*3/uL (ref 1.4–6.5)
Other: 19 %
Platelets: 99 10*3/uL — ABNORMAL LOW (ref 150–440)
RBC: 4.4 MIL/uL (ref 3.80–5.20)
RDW: 14.7 % — ABNORMAL HIGH (ref 11.5–14.5)
WBC: 58 10*3/uL (ref 3.6–11.0)

## 2017-09-01 LAB — PATHOLOGIST SMEAR REVIEW

## 2017-09-01 NOTE — Telephone Encounter (Signed)
12:00 next Thursday sept 13

## 2017-09-01 NOTE — Telephone Encounter (Signed)
Patient will need to be seen to manage all of these issues.  4:00 Thursday  Is the only time I have this week) you will have to open my schedule

## 2017-09-01 NOTE — Telephone Encounter (Signed)
Attempted to reach patient via cell phone, not able to leave a message, called the house number, spoke with the patient, she stated that a guy from the oxygen company came today and changed out one of the equipment pieces as it was broken so she doesn't need to see Dr. Derrel Nip.

## 2017-09-01 NOTE — Telephone Encounter (Signed)
Spoke with the patient, she is going to the cancer center today and her sister peggy is out of town in [place], she is unable to come in for the appt this week Tried for Thursday.  She is going to see if the Cancer center can help her with her oxygen tank questions today, she would like an appt later next week when peggy returns. Please advise.

## 2017-09-01 NOTE — Progress Notes (Signed)
Patient here today for hospital follow up appointment.

## 2017-09-01 NOTE — Telephone Encounter (Signed)
Spoke with Vickii Chafe, patients sister, and she will bring her on Thursday next week to see Dr. Derrel Nip. thanks

## 2017-09-02 DIAGNOSIS — I10 Essential (primary) hypertension: Secondary | ICD-10-CM | POA: Diagnosis not present

## 2017-09-02 DIAGNOSIS — C9110 Chronic lymphocytic leukemia of B-cell type not having achieved remission: Secondary | ICD-10-CM | POA: Diagnosis not present

## 2017-09-02 DIAGNOSIS — B0222 Postherpetic trigeminal neuralgia: Secondary | ICD-10-CM | POA: Diagnosis not present

## 2017-09-02 DIAGNOSIS — J209 Acute bronchitis, unspecified: Secondary | ICD-10-CM | POA: Diagnosis not present

## 2017-09-02 DIAGNOSIS — J440 Chronic obstructive pulmonary disease with acute lower respiratory infection: Secondary | ICD-10-CM | POA: Diagnosis not present

## 2017-09-02 DIAGNOSIS — J9611 Chronic respiratory failure with hypoxia: Secondary | ICD-10-CM | POA: Diagnosis not present

## 2017-09-06 DIAGNOSIS — J209 Acute bronchitis, unspecified: Secondary | ICD-10-CM | POA: Diagnosis not present

## 2017-09-06 DIAGNOSIS — J440 Chronic obstructive pulmonary disease with acute lower respiratory infection: Secondary | ICD-10-CM | POA: Diagnosis not present

## 2017-09-06 DIAGNOSIS — I10 Essential (primary) hypertension: Secondary | ICD-10-CM | POA: Diagnosis not present

## 2017-09-06 DIAGNOSIS — B0222 Postherpetic trigeminal neuralgia: Secondary | ICD-10-CM | POA: Diagnosis not present

## 2017-09-06 DIAGNOSIS — C9110 Chronic lymphocytic leukemia of B-cell type not having achieved remission: Secondary | ICD-10-CM | POA: Diagnosis not present

## 2017-09-06 DIAGNOSIS — J9611 Chronic respiratory failure with hypoxia: Secondary | ICD-10-CM | POA: Diagnosis not present

## 2017-09-07 DIAGNOSIS — C9110 Chronic lymphocytic leukemia of B-cell type not having achieved remission: Secondary | ICD-10-CM | POA: Diagnosis not present

## 2017-09-07 DIAGNOSIS — J209 Acute bronchitis, unspecified: Secondary | ICD-10-CM | POA: Diagnosis not present

## 2017-09-07 DIAGNOSIS — J9611 Chronic respiratory failure with hypoxia: Secondary | ICD-10-CM | POA: Diagnosis not present

## 2017-09-07 DIAGNOSIS — J440 Chronic obstructive pulmonary disease with acute lower respiratory infection: Secondary | ICD-10-CM | POA: Diagnosis not present

## 2017-09-07 DIAGNOSIS — B0222 Postherpetic trigeminal neuralgia: Secondary | ICD-10-CM | POA: Diagnosis not present

## 2017-09-07 DIAGNOSIS — I10 Essential (primary) hypertension: Secondary | ICD-10-CM | POA: Diagnosis not present

## 2017-09-09 ENCOUNTER — Ambulatory Visit (INDEPENDENT_AMBULATORY_CARE_PROVIDER_SITE_OTHER): Payer: Medicare Other | Admitting: Internal Medicine

## 2017-09-09 ENCOUNTER — Encounter: Payer: Self-pay | Admitting: Internal Medicine

## 2017-09-09 DIAGNOSIS — R29898 Other symptoms and signs involving the musculoskeletal system: Secondary | ICD-10-CM | POA: Diagnosis not present

## 2017-09-09 DIAGNOSIS — Z9981 Dependence on supplemental oxygen: Secondary | ICD-10-CM

## 2017-09-09 DIAGNOSIS — J209 Acute bronchitis, unspecified: Secondary | ICD-10-CM | POA: Diagnosis not present

## 2017-09-09 DIAGNOSIS — C9110 Chronic lymphocytic leukemia of B-cell type not having achieved remission: Secondary | ICD-10-CM | POA: Diagnosis not present

## 2017-09-09 DIAGNOSIS — I10 Essential (primary) hypertension: Secondary | ICD-10-CM | POA: Diagnosis not present

## 2017-09-09 DIAGNOSIS — R4189 Other symptoms and signs involving cognitive functions and awareness: Secondary | ICD-10-CM

## 2017-09-09 DIAGNOSIS — B0222 Postherpetic trigeminal neuralgia: Secondary | ICD-10-CM | POA: Diagnosis not present

## 2017-09-09 DIAGNOSIS — J9611 Chronic respiratory failure with hypoxia: Secondary | ICD-10-CM

## 2017-09-09 DIAGNOSIS — J440 Chronic obstructive pulmonary disease with acute lower respiratory infection: Secondary | ICD-10-CM | POA: Diagnosis not present

## 2017-09-09 NOTE — Progress Notes (Signed)
Subjective:  Patient ID: Lisa Figueroa, female    DOB: [birth year]  Age: 83 y.o. MRN: [id number]  CC: Diagnoses of Chronic respiratory failure with hypoxia, on home O2 therapy (HCC), Bilateral leg weakness, and Cognitive deficits were pertinent to this visit.  HPI Lisa Figueroa presents for evaluation of cognitive changes reported by home health   nurse Sherlynn Stalls from [facility]  And by sister Lisa Figueroa making continued independent living of questionable safety .  Specifically:  1) patient not wearing supplemental oxygen and stars frequently 88% 2) taking the wrong day's medications via pill box 3) anorexia, skipping meals  nt drinking.  bp low frequently   Since this report, patient has had her oxygen apparatus evaluated and repaired (per patient)   CT head was done August 14th during hospital admission : chronic micrvascular disease,  cerebral atrophy and c/v atherosclerotic calcifications  Lisa Figueroa'  She needs more care than she is getting.  There eis no daytime caregiver .  Her last shower was last week . Patient washes up and brushes teeth daily.  Notes that she is not steady ,  Uses a walker, gets very short of breath with oxygen.    Patient" states that  "my house is too big for me."   She can no longer manage a house hold .   Willing to move into a rest home. dtr lives in [place].  Patient and peggy have met with social worker and has an FL-2 to sign           Outpatient Medications Prior to Visit  Medication Sig Dispense Refill  . acetaminophen (TYLENOL) 500 MG tablet Take 500 mg by mouth daily.    . Cholecalciferol (VITAMIN D) 2000 units CAPS Take by mouth daily.    . feeding supplement, ENSURE ENLIVE, (ENSURE ENLIVE) LIQD Take 237 mLs by mouth 2 (two) times daily between meals. 237 mL 12  . gabapentin (NEURONTIN) 400 MG capsule Take 1 capsule (400 mg total) by mouth 2 (two) times daily. 180 capsule 2  . INCRUSE ELLIPTA 62.5 MCG/INH AEPB INHALE 1 PUFF INTO THE LUNGS ONCE A  DAY 30 each 5  . levothyroxine (SYNTHROID, LEVOTHROID) 150 MCG tablet Take 1 tablet (150 mcg total) by mouth daily before breakfast.    . losartan (COZAAR) 100 MG tablet Take 1 tablet (100 mg total) by mouth daily. FOR HYPERTENSION 90 tablet 3  . OXYGEN Inhale 2 L into the lungs. 2 liters @ night     No facility-administered medications prior to visit.     Review of Systems;  Patient denies headache, fevers, malaise, unintentional weight loss, skin rash, eye pain, sinus congestion and sinus pain, sore throat, dysphagia,  hemoptysis , cough, dyspnea, wheezing, chest pain, palpitations, orthopnea, edema, abdominal pain, nausea, melena, diarrhea, constipation, flank pain, dysuria, hematuria, urinary  Frequency, nocturia, numbness, tingling, seizures,  Focal weakness, Loss of consciousness,  Tremor, insomnia, depression, anxiety, and suicidal ideation.      Objective:  BP 120/72 (BP Location: Left Arm, Patient Position: Sitting, Cuff Size: Normal)   Pulse 87   Temp 97.9 F (36.6 C) (Oral)   Resp 17   Ht _0  (1.549 m)   Wt 157 lb 6.4 oz (71.4 kg)   SpO2 90%   BMI 29.74 kg/m   BP Readings from Last 3 Encounters:  09/09/17 120/72  09/01/17 113/74  08/16/17 98/66    Wt Readings from Last 3 Encounters:  09/09/17 157 lb 6.4 oz (71.4 kg)  09/01/17  158 lb (71.7 kg)  08/16/17 159 lb 9.6 oz (72.4 kg)    General appearance: alert, cooperative and appears stated age Back: symmetric, no curvature. ROM normal. No CVA tenderness. Lungs: clear to auscultation bilaterally Heart: regular rate and rhythm, S1, S2 normal, no murmur, click, rub or gallop Abdomen: soft, non-tender; bowel sounds normal; no masses,  no organomegaly Pulses: 2+ and symmetric Skin: Skin color, texture, turgor normal. No rashes or lesions Lymph nodes: Cervical, supraclavicular, and axillary nodes normal. MMSE:  25/30   Lab Results  Component Value Date   HGBA1C 5.2 05/14/2015    Lab Results  Component Value  Date   CREATININE 1.11 (H) 08/11/2017   CREATININE 0.90 08/10/2017   CREATININE 0.97 06/14/2017    Lab Results  Component Value Date   WBC 58.0 (HH) 09/01/2017   HGB 11.9 (L) 09/01/2017   HCT 36.8 09/01/2017   PLT 99 (L) 09/01/2017   GLUCOSE 96 08/11/2017   CHOL 215 (H) 03/04/2017   TRIG 142.0 03/04/2017   HDL 43.40 03/04/2017   LDLCALC 143 (H) 03/04/2017   ALT 22 08/10/2017   AST 32 08/10/2017   NA 136 08/11/2017   K 3.9 08/11/2017   CL 96 (L) 08/11/2017   CREATININE 1.11 (H) 08/11/2017   BUN 16 08/11/2017   CO2 32 08/11/2017   TSH 0.56 08/16/2017   INR 1.0 06/27/2013   HGBA1C 5.2 05/14/2015   MICROALBUR 1.2 01/31/2016    Dg Chest 2 View  Result Date: 08/10/2017 CLINICAL DATA:  Increasing leg weakness over the past week. Difficulty moving the legs following a flu shot 1 week ago. On home oxygen with increased utilization at home. History of CLL, thyroid malignancy, chronic bronchitis, former smoker. EXAM: CHEST  2 VIEW COMPARISON:  Chest x-ray of February 15, 2017 FINDINGS: The lungs are adequately inflated. The interstitial markings are coarse especially at the lung bases. There is no alveolar infiltrate or pleural effusion. The cardiac silhouette is top-normal in size and stable. There is calcification in the wall of the thoracic aorta. The mediastinum is normal in width. The bony thorax exhibits no acute abnormality. IMPRESSION: Chronic bronchitic changes, stable. No pneumonia, CHF, nor other acute cardiopulmonary abnormality. Thoracic aortic atherosclerosis. Electronically Signed   By: David  Martinique M.D.   On: 08/10/2017 11:23   Ct Head Wo Contrast  Result Date: 08/10/2017 CLINICAL DATA:  Pt here with c/o increased weakness since last Wednesday after getting flu shot at PCP. PCP sent here for evaluation. Pt denies any symptoms other than weakness EXAM: CT HEAD WITHOUT CONTRAST TECHNIQUE: Contiguous axial images were obtained from the base of the skull through the vertex  without intravenous contrast. COMPARISON:  05/31/2013 FINDINGS: Brain: No evidence of acute infarction, hemorrhage, extra-axial collection, ventriculomegaly, or mass effect. Generalized cerebral atrophy. Periventricular white matter low attenuation likely secondary to microangiopathy. 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 Vascular: Cerebrovascular atherosclerotic calcifications are noted. Skull: Negative for fracture or focal lesion. Sinuses/Orbits: Visualized portions of the orbits are unremarkable. Visualized portions of the paranasal sinuses and mastoid air cells are unremarkable. Other: None. IMPRESSION: 1. No acute intracranial pathology. 2. Chronic microvascular disease and cerebral atrophy. Electronically Signed   By: Kathreen Devoid   On: 08/10/2017 11:55   Ct Angio Chest Pe W And/or Wo Contrast  Result Date: 08/10/2017 CLINICAL DATA:  Hypertension, high cholesterol, CLL. Shortness of breath. EXAM: CT ANGIOGRAPHY CHEST WITH CONTRAST TECHNIQUE: Multidetector CT imaging of the chest was performed using the standard  protocol during bolus administration of intravenous contrast. Multiplanar CT image reconstructions and MIPs were obtained to evaluate the vascular anatomy. CONTRAST:  75 cc Isovue 370 IV COMPARISON:  10/04/2016 FINDINGS: Cardiovascular: No filling defects in the pulmonary arteries to suggestpulmonary emboli. Diffuse aortic calcifications. Mild aneurysmal dilatation of the ascending thoracic aorta, 4.3 cm. Heart is borderline in size. Scattered coronary artery calcifications. Mediastinum/Nodes: Marked bilateral axillary adenopathy, worsening since prior study. Index left axillary lymph node has a short axis diameter of 4.3 cm compared to 1.4 cm previously. Index right axillary lymph node has a short axis diameter of 2.5 cm compared to 1.1 cm previously. Borderline size hilar lymph nodes. Enlarged right hilar lymph node measures 2 cm in short axis diameter. This compares to 1.5 cm  previously. Mildly enlarged mediastinal lymph nodes. Prevascular lymph node has a short axis diameter of 10 mm, similar to prior study. Right paratracheal node has a short axis diameter of 10 mm, stable since prior study. Lungs/Pleura: Ground-glass opacities again noted in the upper lobes, likely scarring. Centrilobular emphysema. No confluent opacities or effusions. Upper Abdomen: Splenomegaly again partially imaged, likely increased since prior study. Suspect upper abdominal adenopathy in the region of the celiac axis/gastrohepatic ligament. Index lymph node measures 11 mm in short axis diameter in the gastrohepatic ligament, or increased since prior study. Musculoskeletal: Chest wall soft tissues are unremarkable. No acute bony abnormality. Review of the MIP images confirms the above findings. IMPRESSION: Marked worsening of and bilateral axillary adenopathy. Stable mildly enlarged mediastinal and left hilar lymph nodes. Mild enlargement of right hilar lymph nodes. Enlarging upper abdominal lymph nodes and worsening splenomegaly. No evidence of pulmonary embolus. 4.3 cm ascending thoracic aortic aneurysm. Recommend annual imaging followup by CTA or MRA. This recommendation follows 2010 ACCF/AHA/AATS/ACR/ASA/SCA/SCAI/SIR/STS/SVM Guidelines for the Diagnosis and Management of Patients with Thoracic Aortic Disease. Circulation. 2010; 121: P034-K352 Aortic Atherosclerosis (ICD10-I70.0) and Emphysema (ICD10-J43.9). Electronically Signed   By: Rolm Baptise M.D.   On: 08/10/2017 13:44    Assessment & Plan:   Problem List Items Addressed This Visit    Bilateral leg weakness    Patient continues to require supervision of ambulation due to bilateral weakness       Chronic respiratory failure with hypoxia, on home O2 therapy ([place])    She continues to require supplemental oxygen  For daytime use .       Cognitive deficits    She has been having increasing difficulty managing her medications and living  independently.  MMSE score today was 26/30.  FL-2 form signed for transition to rest home.        A total of 25 minutes of face to face time was spent with patient more than half of which was spent in counselling about the above mentioned conditions  and coordination of care   I am having Ms. Russman maintain her acetaminophen, Vitamin D, feeding supplement (ENSURE ENLIVE), OXYGEN, INCRUSE ELLIPTA, levothyroxine, losartan, and gabapentin.  No orders of the defined types were placed in this encounter.   There are no discontinued medications.  Follow-up: No Follow-up on file.   Crecencio Mc, MD

## 2017-09-09 NOTE — Patient Instructions (Signed)
I agree that you should not continue to live independently because you need supervision and assistance   I have signed the FL-2  Form for your transition  I will continue to be your doctor if you can continue to come to the office  No medication changes were made today, except that you should wear your oxygen continously (not just at night)

## 2017-09-11 DIAGNOSIS — R4189 Other symptoms and signs involving cognitive functions and awareness: Secondary | ICD-10-CM | POA: Insufficient documentation

## 2017-09-11 NOTE — Assessment & Plan Note (Signed)
She continues to require supplemental oxygen  For daytime use .

## 2017-09-11 NOTE — Assessment & Plan Note (Signed)
She has been having increasing difficulty managing her medications and living independently.  MMSE score today was 26/30.  FL-2 form signed for transition to rest home.

## 2017-09-11 NOTE — Assessment & Plan Note (Signed)
Patient continues to require supervision of ambulation due to bilateral weakness

## 2017-09-13 DIAGNOSIS — J209 Acute bronchitis, unspecified: Secondary | ICD-10-CM | POA: Diagnosis not present

## 2017-09-13 DIAGNOSIS — C9110 Chronic lymphocytic leukemia of B-cell type not having achieved remission: Secondary | ICD-10-CM | POA: Diagnosis not present

## 2017-09-13 DIAGNOSIS — B0222 Postherpetic trigeminal neuralgia: Secondary | ICD-10-CM | POA: Diagnosis not present

## 2017-09-13 DIAGNOSIS — I10 Essential (primary) hypertension: Secondary | ICD-10-CM | POA: Diagnosis not present

## 2017-09-13 DIAGNOSIS — J9611 Chronic respiratory failure with hypoxia: Secondary | ICD-10-CM | POA: Diagnosis not present

## 2017-09-13 DIAGNOSIS — J440 Chronic obstructive pulmonary disease with acute lower respiratory infection: Secondary | ICD-10-CM | POA: Diagnosis not present

## 2017-09-15 ENCOUNTER — Telehealth: Payer: Self-pay | Admitting: Internal Medicine

## 2017-09-15 DIAGNOSIS — I10 Essential (primary) hypertension: Secondary | ICD-10-CM | POA: Diagnosis not present

## 2017-09-15 DIAGNOSIS — J209 Acute bronchitis, unspecified: Secondary | ICD-10-CM | POA: Diagnosis not present

## 2017-09-15 DIAGNOSIS — J440 Chronic obstructive pulmonary disease with acute lower respiratory infection: Secondary | ICD-10-CM | POA: Diagnosis not present

## 2017-09-15 DIAGNOSIS — B0222 Postherpetic trigeminal neuralgia: Secondary | ICD-10-CM | POA: Diagnosis not present

## 2017-09-15 DIAGNOSIS — C9110 Chronic lymphocytic leukemia of B-cell type not having achieved remission: Secondary | ICD-10-CM | POA: Diagnosis not present

## 2017-09-15 DIAGNOSIS — J9611 Chronic respiratory failure with hypoxia: Secondary | ICD-10-CM | POA: Diagnosis not present

## 2017-09-15 MED ORDER — LEVOTHYROXINE SODIUM 150 MCG PO TABS: 150.0000 ug | ORAL_TABLET | Freq: Every day | ORAL | 0 refills | Status: DC

## 2017-09-15 NOTE — Telephone Encounter (Signed)
Pt sister called requesting a refill on pt's levothyroxine (SYNTHROID, LEVOTHROID) 150 MCG tablet, and losartan (COZAAR) 100 MG tablet. Please advise, thank you!  Pharmacy - [id number] - [place], [place] - [address]

## 2017-09-15 NOTE — Telephone Encounter (Signed)
Refilled the levothyroxine and called the pt's sister to let her know that there is refills for the losartan at the pharmacy already. Sister gave a verbal understanding.

## 2017-09-20 ENCOUNTER — Telehealth: Payer: Self-pay

## 2017-09-20 DIAGNOSIS — J440 Chronic obstructive pulmonary disease with acute lower respiratory infection: Secondary | ICD-10-CM | POA: Diagnosis not present

## 2017-09-20 DIAGNOSIS — J9611 Chronic respiratory failure with hypoxia: Secondary | ICD-10-CM | POA: Diagnosis not present

## 2017-09-20 DIAGNOSIS — I10 Essential (primary) hypertension: Secondary | ICD-10-CM | POA: Diagnosis not present

## 2017-09-20 DIAGNOSIS — J209 Acute bronchitis, unspecified: Secondary | ICD-10-CM | POA: Diagnosis not present

## 2017-09-20 DIAGNOSIS — C9110 Chronic lymphocytic leukemia of B-cell type not having achieved remission: Secondary | ICD-10-CM | POA: Diagnosis not present

## 2017-09-20 DIAGNOSIS — B0222 Postherpetic trigeminal neuralgia: Secondary | ICD-10-CM | POA: Diagnosis not present

## 2017-09-20 NOTE — Telephone Encounter (Signed)
Left a VM with sister to schedule next prolia injection, there is no cost to her must be scheduled after the 6th of October, thanks

## 2017-09-21 ENCOUNTER — Telehealth: Payer: Self-pay | Admitting: Internal Medicine

## 2017-09-21 NOTE — Telephone Encounter (Signed)
Pt sister Vickii Chafe called and stated that they are looking to move patient to another facility. They feel that she would be best suited for hospice with the patient being on full time oxygen. Peggy states that a Harvel Ricks will becoming in with paperwork in regards to the oz. Please advise, thank you!

## 2017-09-22 DIAGNOSIS — J9611 Chronic respiratory failure with hypoxia: Secondary | ICD-10-CM | POA: Diagnosis not present

## 2017-09-22 DIAGNOSIS — B0222 Postherpetic trigeminal neuralgia: Secondary | ICD-10-CM | POA: Diagnosis not present

## 2017-09-22 DIAGNOSIS — J209 Acute bronchitis, unspecified: Secondary | ICD-10-CM | POA: Diagnosis not present

## 2017-09-22 DIAGNOSIS — J440 Chronic obstructive pulmonary disease with acute lower respiratory infection: Secondary | ICD-10-CM | POA: Diagnosis not present

## 2017-09-22 DIAGNOSIS — C9110 Chronic lymphocytic leukemia of B-cell type not having achieved remission: Secondary | ICD-10-CM | POA: Diagnosis not present

## 2017-09-22 DIAGNOSIS — I10 Essential (primary) hypertension: Secondary | ICD-10-CM | POA: Diagnosis not present

## 2017-09-22 NOTE — Telephone Encounter (Signed)
FYI

## 2017-09-23 DIAGNOSIS — J449 Chronic obstructive pulmonary disease, unspecified: Secondary | ICD-10-CM | POA: Diagnosis not present

## 2017-09-23 DIAGNOSIS — J9611 Chronic respiratory failure with hypoxia: Secondary | ICD-10-CM | POA: Diagnosis not present

## 2017-09-23 DIAGNOSIS — M6281 Muscle weakness (generalized): Secondary | ICD-10-CM | POA: Diagnosis not present

## 2017-09-23 DIAGNOSIS — C9111 Chronic lymphocytic leukemia of B-cell type in remission: Secondary | ICD-10-CM | POA: Diagnosis not present

## 2017-09-24 ENCOUNTER — Telehealth: Payer: Self-pay | Admitting: *Deleted

## 2017-09-24 NOTE — Telephone Encounter (Signed)
FYI

## 2017-09-24 NOTE — Telephone Encounter (Signed)
Reynaldo Minium [facility] reported that pt was admitted to hospice on 09/027/18. Otila Kluver Contact [phone]

## 2017-09-27 DIAGNOSIS — J449 Chronic obstructive pulmonary disease, unspecified: Secondary | ICD-10-CM | POA: Diagnosis not present

## 2017-09-27 DIAGNOSIS — C9111 Chronic lymphocytic leukemia of B-cell type in remission: Secondary | ICD-10-CM | POA: Diagnosis not present

## 2017-09-27 DIAGNOSIS — J9611 Chronic respiratory failure with hypoxia: Secondary | ICD-10-CM | POA: Diagnosis not present

## 2017-09-27 DIAGNOSIS — M6281 Muscle weakness (generalized): Secondary | ICD-10-CM | POA: Diagnosis not present

## 2017-09-28 DIAGNOSIS — C9111 Chronic lymphocytic leukemia of B-cell type in remission: Secondary | ICD-10-CM | POA: Diagnosis not present

## 2017-09-28 DIAGNOSIS — M6281 Muscle weakness (generalized): Secondary | ICD-10-CM | POA: Diagnosis not present

## 2017-09-28 DIAGNOSIS — J9611 Chronic respiratory failure with hypoxia: Secondary | ICD-10-CM | POA: Diagnosis not present

## 2017-09-28 DIAGNOSIS — J449 Chronic obstructive pulmonary disease, unspecified: Secondary | ICD-10-CM | POA: Diagnosis not present

## 2017-09-29 ENCOUNTER — Telehealth: Payer: Self-pay | Admitting: Internal Medicine

## 2017-09-29 ENCOUNTER — Inpatient Hospital Stay: Payer: Medicare Other

## 2017-09-29 ENCOUNTER — Telehealth: Payer: Self-pay | Admitting: *Deleted

## 2017-09-29 DIAGNOSIS — Z0279 Encounter for issue of other medical certificate: Secondary | ICD-10-CM

## 2017-09-29 NOTE — Telephone Encounter (Signed)
FL-2 intake forms for [facility] complete.  Please charge  $50 for completion of forms .  She needs TB skin test completed prior to entrance ., please arrange.

## 2017-09-29 NOTE — Telephone Encounter (Signed)
Lisa Figueroa from [facility] has requested the last three office notes  Fax [phone]

## 2017-09-30 DIAGNOSIS — M6281 Muscle weakness (generalized): Secondary | ICD-10-CM | POA: Diagnosis not present

## 2017-09-30 DIAGNOSIS — J449 Chronic obstructive pulmonary disease, unspecified: Secondary | ICD-10-CM | POA: Diagnosis not present

## 2017-09-30 DIAGNOSIS — C9111 Chronic lymphocytic leukemia of B-cell type in remission: Secondary | ICD-10-CM | POA: Diagnosis not present

## 2017-09-30 DIAGNOSIS — J9611 Chronic respiratory failure with hypoxia: Secondary | ICD-10-CM | POA: Diagnosis not present

## 2017-09-30 NOTE — Telephone Encounter (Signed)
Pt's sister came by and picked up the forms yesterday. Sister stated that pt had her tb test done at [facility] and they stated that they do have the results of the TB.

## 2017-09-30 NOTE — Telephone Encounter (Signed)
Faxed these office notes last night before leaving the office.

## 2017-10-01 DIAGNOSIS — J449 Chronic obstructive pulmonary disease, unspecified: Secondary | ICD-10-CM | POA: Diagnosis not present

## 2017-10-01 DIAGNOSIS — M6281 Muscle weakness (generalized): Secondary | ICD-10-CM | POA: Diagnosis not present

## 2017-10-01 DIAGNOSIS — J9611 Chronic respiratory failure with hypoxia: Secondary | ICD-10-CM | POA: Diagnosis not present

## 2017-10-01 DIAGNOSIS — C9111 Chronic lymphocytic leukemia of B-cell type in remission: Secondary | ICD-10-CM | POA: Diagnosis not present

## 2017-10-02 DIAGNOSIS — C9111 Chronic lymphocytic leukemia of B-cell type in remission: Secondary | ICD-10-CM | POA: Diagnosis not present

## 2017-10-02 DIAGNOSIS — M6281 Muscle weakness (generalized): Secondary | ICD-10-CM | POA: Diagnosis not present

## 2017-10-02 DIAGNOSIS — J449 Chronic obstructive pulmonary disease, unspecified: Secondary | ICD-10-CM | POA: Diagnosis not present

## 2017-10-02 DIAGNOSIS — J9611 Chronic respiratory failure with hypoxia: Secondary | ICD-10-CM | POA: Diagnosis not present

## 2017-10-04 DIAGNOSIS — C9111 Chronic lymphocytic leukemia of B-cell type in remission: Secondary | ICD-10-CM | POA: Diagnosis not present

## 2017-10-04 DIAGNOSIS — M6281 Muscle weakness (generalized): Secondary | ICD-10-CM | POA: Diagnosis not present

## 2017-10-04 DIAGNOSIS — J449 Chronic obstructive pulmonary disease, unspecified: Secondary | ICD-10-CM | POA: Diagnosis not present

## 2017-10-04 DIAGNOSIS — J9611 Chronic respiratory failure with hypoxia: Secondary | ICD-10-CM | POA: Diagnosis not present

## 2017-10-05 DIAGNOSIS — C9111 Chronic lymphocytic leukemia of B-cell type in remission: Secondary | ICD-10-CM | POA: Diagnosis not present

## 2017-10-05 DIAGNOSIS — J449 Chronic obstructive pulmonary disease, unspecified: Secondary | ICD-10-CM | POA: Diagnosis not present

## 2017-10-05 DIAGNOSIS — M6281 Muscle weakness (generalized): Secondary | ICD-10-CM | POA: Diagnosis not present

## 2017-10-05 DIAGNOSIS — J9611 Chronic respiratory failure with hypoxia: Secondary | ICD-10-CM | POA: Diagnosis not present

## 2017-10-06 DIAGNOSIS — J449 Chronic obstructive pulmonary disease, unspecified: Secondary | ICD-10-CM | POA: Diagnosis not present

## 2017-10-06 DIAGNOSIS — M6281 Muscle weakness (generalized): Secondary | ICD-10-CM | POA: Diagnosis not present

## 2017-10-06 DIAGNOSIS — C9111 Chronic lymphocytic leukemia of B-cell type in remission: Secondary | ICD-10-CM | POA: Diagnosis not present

## 2017-10-06 DIAGNOSIS — J9611 Chronic respiratory failure with hypoxia: Secondary | ICD-10-CM | POA: Diagnosis not present

## 2017-10-07 DIAGNOSIS — J9611 Chronic respiratory failure with hypoxia: Secondary | ICD-10-CM | POA: Diagnosis not present

## 2017-10-07 DIAGNOSIS — J449 Chronic obstructive pulmonary disease, unspecified: Secondary | ICD-10-CM | POA: Diagnosis not present

## 2017-10-07 DIAGNOSIS — M6281 Muscle weakness (generalized): Secondary | ICD-10-CM | POA: Diagnosis not present

## 2017-10-07 DIAGNOSIS — C9111 Chronic lymphocytic leukemia of B-cell type in remission: Secondary | ICD-10-CM | POA: Diagnosis not present

## 2017-10-08 DIAGNOSIS — C9111 Chronic lymphocytic leukemia of B-cell type in remission: Secondary | ICD-10-CM | POA: Diagnosis not present

## 2017-10-08 DIAGNOSIS — J449 Chronic obstructive pulmonary disease, unspecified: Secondary | ICD-10-CM | POA: Diagnosis not present

## 2017-10-08 DIAGNOSIS — J9611 Chronic respiratory failure with hypoxia: Secondary | ICD-10-CM | POA: Diagnosis not present

## 2017-10-08 DIAGNOSIS — M6281 Muscle weakness (generalized): Secondary | ICD-10-CM | POA: Diagnosis not present

## 2017-10-09 DIAGNOSIS — J9611 Chronic respiratory failure with hypoxia: Secondary | ICD-10-CM | POA: Diagnosis not present

## 2017-10-09 DIAGNOSIS — J449 Chronic obstructive pulmonary disease, unspecified: Secondary | ICD-10-CM | POA: Diagnosis not present

## 2017-10-09 DIAGNOSIS — C9111 Chronic lymphocytic leukemia of B-cell type in remission: Secondary | ICD-10-CM | POA: Diagnosis not present

## 2017-10-09 DIAGNOSIS — M6281 Muscle weakness (generalized): Secondary | ICD-10-CM | POA: Diagnosis not present

## 2017-10-12 DIAGNOSIS — C9111 Chronic lymphocytic leukemia of B-cell type in remission: Secondary | ICD-10-CM | POA: Diagnosis not present

## 2017-10-12 DIAGNOSIS — J9611 Chronic respiratory failure with hypoxia: Secondary | ICD-10-CM | POA: Diagnosis not present

## 2017-10-12 DIAGNOSIS — M6281 Muscle weakness (generalized): Secondary | ICD-10-CM | POA: Diagnosis not present

## 2017-10-12 DIAGNOSIS — J449 Chronic obstructive pulmonary disease, unspecified: Secondary | ICD-10-CM | POA: Diagnosis not present

## 2017-10-14 DIAGNOSIS — J449 Chronic obstructive pulmonary disease, unspecified: Secondary | ICD-10-CM | POA: Diagnosis not present

## 2017-10-14 DIAGNOSIS — C9111 Chronic lymphocytic leukemia of B-cell type in remission: Secondary | ICD-10-CM | POA: Diagnosis not present

## 2017-10-14 DIAGNOSIS — M6281 Muscle weakness (generalized): Secondary | ICD-10-CM | POA: Diagnosis not present

## 2017-10-14 DIAGNOSIS — J9611 Chronic respiratory failure with hypoxia: Secondary | ICD-10-CM | POA: Diagnosis not present

## 2017-10-16 DIAGNOSIS — C9111 Chronic lymphocytic leukemia of B-cell type in remission: Secondary | ICD-10-CM | POA: Diagnosis not present

## 2017-10-16 DIAGNOSIS — M6281 Muscle weakness (generalized): Secondary | ICD-10-CM | POA: Diagnosis not present

## 2017-10-16 DIAGNOSIS — J449 Chronic obstructive pulmonary disease, unspecified: Secondary | ICD-10-CM | POA: Diagnosis not present

## 2017-10-16 DIAGNOSIS — J9611 Chronic respiratory failure with hypoxia: Secondary | ICD-10-CM | POA: Diagnosis not present

## 2017-10-19 ENCOUNTER — Other Ambulatory Visit: Payer: Medicare Other

## 2017-10-19 ENCOUNTER — Telehealth: Payer: Self-pay | Admitting: Internal Medicine

## 2017-10-19 DIAGNOSIS — R300 Dysuria: Secondary | ICD-10-CM

## 2017-10-19 DIAGNOSIS — M6281 Muscle weakness (generalized): Secondary | ICD-10-CM | POA: Diagnosis not present

## 2017-10-19 DIAGNOSIS — J449 Chronic obstructive pulmonary disease, unspecified: Secondary | ICD-10-CM | POA: Diagnosis not present

## 2017-10-19 DIAGNOSIS — C9111 Chronic lymphocytic leukemia of B-cell type in remission: Secondary | ICD-10-CM | POA: Diagnosis not present

## 2017-10-19 DIAGNOSIS — J9611 Chronic respiratory failure with hypoxia: Secondary | ICD-10-CM | POA: Diagnosis not present

## 2017-10-19 NOTE — Telephone Encounter (Signed)
Lattie Haw [phone] called from [facility] regarding pt having a possible UTI she has a strong odor and burning with urination. Lattie Haw would like to know if she can drop off a urine sample to the office? Please advise? Thank you!

## 2017-10-19 NOTE — Telephone Encounter (Signed)
Patient scheduled for lab. Lattie Haw from [facility] is bringing urine sample per Dr. Lupita Dawn approval. Orders have been placed. Faxed order for nurse to collect urine sample.

## 2017-10-19 NOTE — Telephone Encounter (Signed)
Please advise 

## 2017-10-20 DIAGNOSIS — C9111 Chronic lymphocytic leukemia of B-cell type in remission: Secondary | ICD-10-CM | POA: Diagnosis not present

## 2017-10-20 DIAGNOSIS — J449 Chronic obstructive pulmonary disease, unspecified: Secondary | ICD-10-CM | POA: Diagnosis not present

## 2017-10-20 DIAGNOSIS — J9611 Chronic respiratory failure with hypoxia: Secondary | ICD-10-CM | POA: Diagnosis not present

## 2017-10-20 DIAGNOSIS — M6281 Muscle weakness (generalized): Secondary | ICD-10-CM | POA: Diagnosis not present

## 2017-10-20 LAB — URINALYSIS, ROUTINE W REFLEX MICROSCOPIC
BILIRUBIN URINE: NEGATIVE
Bacteria, UA: NONE SEEN /HPF
Glucose, UA: NEGATIVE
HGB URINE DIPSTICK: NEGATIVE
Hyaline Cast: NONE SEEN /LPF
KETONES UR: NEGATIVE
Nitrite: NEGATIVE
PH: 7 (ref 5.0–8.0)
PROTEIN: NEGATIVE
RBC / HPF: NONE SEEN /HPF (ref 0–2)
Specific Gravity, Urine: 1.009 (ref 1.001–1.03)
Squamous Epithelial / LPF: NONE SEEN /HPF (ref <=5)

## 2017-10-20 LAB — EXTRA URINE SPECIMEN

## 2017-10-21 DIAGNOSIS — J9611 Chronic respiratory failure with hypoxia: Secondary | ICD-10-CM | POA: Diagnosis not present

## 2017-10-21 DIAGNOSIS — C9111 Chronic lymphocytic leukemia of B-cell type in remission: Secondary | ICD-10-CM | POA: Diagnosis not present

## 2017-10-21 DIAGNOSIS — J449 Chronic obstructive pulmonary disease, unspecified: Secondary | ICD-10-CM | POA: Diagnosis not present

## 2017-10-21 DIAGNOSIS — M6281 Muscle weakness (generalized): Secondary | ICD-10-CM | POA: Diagnosis not present

## 2017-10-22 LAB — URINE CULTURE
MICRO NUMBER:: 81184583
SPECIMEN QUALITY:: ADEQUATE

## 2017-10-22 MED ORDER — SULFAMETHOXAZOLE-TRIMETHOPRIM 800-160 MG PO TABS: 1.0000 | ORAL_TABLET | Freq: Two times a day (BID) | ORAL | 0 refills | Status: AC

## 2017-10-22 NOTE — Addendum Note (Signed)
Addended by: Crecencio Mc on: 10/22/2017 01:33 PM   Modules accepted: Orders

## 2017-10-23 DIAGNOSIS — C9111 Chronic lymphocytic leukemia of B-cell type in remission: Secondary | ICD-10-CM | POA: Diagnosis not present

## 2017-10-23 DIAGNOSIS — J449 Chronic obstructive pulmonary disease, unspecified: Secondary | ICD-10-CM | POA: Diagnosis not present

## 2017-10-23 DIAGNOSIS — M6281 Muscle weakness (generalized): Secondary | ICD-10-CM | POA: Diagnosis not present

## 2017-10-23 DIAGNOSIS — J9611 Chronic respiratory failure with hypoxia: Secondary | ICD-10-CM | POA: Diagnosis not present

## 2017-10-26 DIAGNOSIS — J449 Chronic obstructive pulmonary disease, unspecified: Secondary | ICD-10-CM | POA: Diagnosis not present

## 2017-10-26 DIAGNOSIS — J9611 Chronic respiratory failure with hypoxia: Secondary | ICD-10-CM | POA: Diagnosis not present

## 2017-10-26 DIAGNOSIS — C9111 Chronic lymphocytic leukemia of B-cell type in remission: Secondary | ICD-10-CM | POA: Diagnosis not present

## 2017-10-26 DIAGNOSIS — M6281 Muscle weakness (generalized): Secondary | ICD-10-CM | POA: Diagnosis not present

## 2017-10-28 DIAGNOSIS — J9611 Chronic respiratory failure with hypoxia: Secondary | ICD-10-CM | POA: Diagnosis not present

## 2017-10-28 DIAGNOSIS — M6281 Muscle weakness (generalized): Secondary | ICD-10-CM | POA: Diagnosis not present

## 2017-10-28 DIAGNOSIS — C9111 Chronic lymphocytic leukemia of B-cell type in remission: Secondary | ICD-10-CM | POA: Diagnosis not present

## 2017-10-28 DIAGNOSIS — J449 Chronic obstructive pulmonary disease, unspecified: Secondary | ICD-10-CM | POA: Diagnosis not present

## 2017-11-01 DIAGNOSIS — C9111 Chronic lymphocytic leukemia of B-cell type in remission: Secondary | ICD-10-CM | POA: Diagnosis not present

## 2017-11-01 DIAGNOSIS — J9611 Chronic respiratory failure with hypoxia: Secondary | ICD-10-CM | POA: Diagnosis not present

## 2017-11-01 DIAGNOSIS — M6281 Muscle weakness (generalized): Secondary | ICD-10-CM | POA: Diagnosis not present

## 2017-11-01 DIAGNOSIS — J449 Chronic obstructive pulmonary disease, unspecified: Secondary | ICD-10-CM | POA: Diagnosis not present

## 2017-11-03 DIAGNOSIS — J449 Chronic obstructive pulmonary disease, unspecified: Secondary | ICD-10-CM | POA: Diagnosis not present

## 2017-11-03 DIAGNOSIS — M6281 Muscle weakness (generalized): Secondary | ICD-10-CM | POA: Diagnosis not present

## 2017-11-03 DIAGNOSIS — J9611 Chronic respiratory failure with hypoxia: Secondary | ICD-10-CM | POA: Diagnosis not present

## 2017-11-03 DIAGNOSIS — C9111 Chronic lymphocytic leukemia of B-cell type in remission: Secondary | ICD-10-CM | POA: Diagnosis not present

## 2017-11-04 DIAGNOSIS — J9611 Chronic respiratory failure with hypoxia: Secondary | ICD-10-CM | POA: Diagnosis not present

## 2017-11-04 DIAGNOSIS — C9111 Chronic lymphocytic leukemia of B-cell type in remission: Secondary | ICD-10-CM | POA: Diagnosis not present

## 2017-11-04 DIAGNOSIS — J449 Chronic obstructive pulmonary disease, unspecified: Secondary | ICD-10-CM | POA: Diagnosis not present

## 2017-11-04 DIAGNOSIS — M6281 Muscle weakness (generalized): Secondary | ICD-10-CM | POA: Diagnosis not present

## 2017-11-05 DIAGNOSIS — J9611 Chronic respiratory failure with hypoxia: Secondary | ICD-10-CM | POA: Diagnosis not present

## 2017-11-05 DIAGNOSIS — C9111 Chronic lymphocytic leukemia of B-cell type in remission: Secondary | ICD-10-CM | POA: Diagnosis not present

## 2017-11-05 DIAGNOSIS — M6281 Muscle weakness (generalized): Secondary | ICD-10-CM | POA: Diagnosis not present

## 2017-11-05 DIAGNOSIS — J449 Chronic obstructive pulmonary disease, unspecified: Secondary | ICD-10-CM | POA: Diagnosis not present

## 2017-11-08 DIAGNOSIS — J449 Chronic obstructive pulmonary disease, unspecified: Secondary | ICD-10-CM | POA: Diagnosis not present

## 2017-11-08 DIAGNOSIS — C9111 Chronic lymphocytic leukemia of B-cell type in remission: Secondary | ICD-10-CM | POA: Diagnosis not present

## 2017-11-08 DIAGNOSIS — J9611 Chronic respiratory failure with hypoxia: Secondary | ICD-10-CM | POA: Diagnosis not present

## 2017-11-08 DIAGNOSIS — M6281 Muscle weakness (generalized): Secondary | ICD-10-CM | POA: Diagnosis not present

## 2017-11-09 DIAGNOSIS — M6281 Muscle weakness (generalized): Secondary | ICD-10-CM | POA: Diagnosis not present

## 2017-11-09 DIAGNOSIS — J449 Chronic obstructive pulmonary disease, unspecified: Secondary | ICD-10-CM | POA: Diagnosis not present

## 2017-11-09 DIAGNOSIS — C9111 Chronic lymphocytic leukemia of B-cell type in remission: Secondary | ICD-10-CM | POA: Diagnosis not present

## 2017-11-09 DIAGNOSIS — J9611 Chronic respiratory failure with hypoxia: Secondary | ICD-10-CM | POA: Diagnosis not present

## 2017-11-11 DIAGNOSIS — C9111 Chronic lymphocytic leukemia of B-cell type in remission: Secondary | ICD-10-CM | POA: Diagnosis not present

## 2017-11-11 DIAGNOSIS — E038 Other specified hypothyroidism: Secondary | ICD-10-CM | POA: Diagnosis not present

## 2017-11-11 DIAGNOSIS — C91Z0 Other lymphoid leukemia not having achieved remission: Secondary | ICD-10-CM | POA: Diagnosis not present

## 2017-11-11 DIAGNOSIS — M6281 Muscle weakness (generalized): Secondary | ICD-10-CM | POA: Diagnosis not present

## 2017-11-11 DIAGNOSIS — Z79899 Other long term (current) drug therapy: Secondary | ICD-10-CM | POA: Diagnosis not present

## 2017-11-11 DIAGNOSIS — R600 Localized edema: Secondary | ICD-10-CM | POA: Diagnosis not present

## 2017-11-11 DIAGNOSIS — G8929 Other chronic pain: Secondary | ICD-10-CM | POA: Diagnosis not present

## 2017-11-11 DIAGNOSIS — J9611 Chronic respiratory failure with hypoxia: Secondary | ICD-10-CM | POA: Diagnosis not present

## 2017-11-11 DIAGNOSIS — J449 Chronic obstructive pulmonary disease, unspecified: Secondary | ICD-10-CM | POA: Diagnosis not present

## 2017-11-12 DIAGNOSIS — M6281 Muscle weakness (generalized): Secondary | ICD-10-CM | POA: Diagnosis not present

## 2017-11-12 DIAGNOSIS — J9611 Chronic respiratory failure with hypoxia: Secondary | ICD-10-CM | POA: Diagnosis not present

## 2017-11-12 DIAGNOSIS — C9111 Chronic lymphocytic leukemia of B-cell type in remission: Secondary | ICD-10-CM | POA: Diagnosis not present

## 2017-11-12 DIAGNOSIS — J449 Chronic obstructive pulmonary disease, unspecified: Secondary | ICD-10-CM | POA: Diagnosis not present

## 2017-11-15 DIAGNOSIS — M6281 Muscle weakness (generalized): Secondary | ICD-10-CM | POA: Diagnosis not present

## 2017-11-15 DIAGNOSIS — C9111 Chronic lymphocytic leukemia of B-cell type in remission: Secondary | ICD-10-CM | POA: Diagnosis not present

## 2017-11-15 DIAGNOSIS — J449 Chronic obstructive pulmonary disease, unspecified: Secondary | ICD-10-CM | POA: Diagnosis not present

## 2017-11-15 DIAGNOSIS — J9611 Chronic respiratory failure with hypoxia: Secondary | ICD-10-CM | POA: Diagnosis not present

## 2017-11-16 DIAGNOSIS — R140 Abdominal distension (gaseous): Secondary | ICD-10-CM | POA: Diagnosis not present

## 2017-11-16 DIAGNOSIS — R609 Edema, unspecified: Secondary | ICD-10-CM | POA: Diagnosis not present

## 2017-11-16 DIAGNOSIS — I10 Essential (primary) hypertension: Secondary | ICD-10-CM | POA: Diagnosis not present

## 2017-11-16 DIAGNOSIS — Z79899 Other long term (current) drug therapy: Secondary | ICD-10-CM | POA: Diagnosis not present

## 2017-11-16 DIAGNOSIS — R2689 Other abnormalities of gait and mobility: Secondary | ICD-10-CM | POA: Diagnosis not present

## 2017-11-16 DIAGNOSIS — G8929 Other chronic pain: Secondary | ICD-10-CM | POA: Diagnosis not present

## 2017-11-16 DIAGNOSIS — M545 Low back pain: Secondary | ICD-10-CM | POA: Diagnosis not present

## 2017-11-17 ENCOUNTER — Telehealth: Payer: Self-pay | Admitting: Internal Medicine

## 2017-11-17 DIAGNOSIS — M6281 Muscle weakness (generalized): Secondary | ICD-10-CM | POA: Diagnosis not present

## 2017-11-17 DIAGNOSIS — J9611 Chronic respiratory failure with hypoxia: Secondary | ICD-10-CM | POA: Diagnosis not present

## 2017-11-17 DIAGNOSIS — C9111 Chronic lymphocytic leukemia of B-cell type in remission: Secondary | ICD-10-CM | POA: Diagnosis not present

## 2017-11-17 DIAGNOSIS — J449 Chronic obstructive pulmonary disease, unspecified: Secondary | ICD-10-CM | POA: Diagnosis not present

## 2017-11-17 MED ORDER — TRAMADOL HCL 50 MG PO TABS: 50.0000 mg | ORAL_TABLET | Freq: Four times a day (QID) | ORAL | 2 refills | Status: AC | PRN

## 2017-11-17 NOTE — Telephone Encounter (Signed)
Lisa Figueroa from [facility] called. Patient is currently taking: Tramadol 1/2 tablet TID PRN Tylenol extra strength 500 mg 1 time a day scheduled.   Can we increase Tramadol and Tylenol?  Pain Is pointed at right below the right shoulder blade. NO signs of break down no recent falls. Patient says after tramadol she has relief for about an hour. Please advise.

## 2017-11-17 NOTE — Telephone Encounter (Signed)
TRAMADOL 50 MG EVERY 6 HOURS #120 PRINTED .   TYLENOL CAN BE INCREASED TO 1000 MG TWICE DAILY

## 2017-11-17 NOTE — Telephone Encounter (Signed)
Will need hard copy faxed to: [phone] Atten: Santiago Glad  Patient is suffering from back pain that is not relieved by Tramadol 50 mg 1/2 tablet  3 times daily as needed and tylenol on order.  Can this be changed or can patient be given something different. Please advise before day end so they can control her pain. Fax and call [phone] to advise

## 2017-11-17 NOTE — Telephone Encounter (Signed)
Orders faxed. Freda Munro aware.

## 2017-11-19 DIAGNOSIS — C9111 Chronic lymphocytic leukemia of B-cell type in remission: Secondary | ICD-10-CM | POA: Diagnosis not present

## 2017-11-19 DIAGNOSIS — J449 Chronic obstructive pulmonary disease, unspecified: Secondary | ICD-10-CM | POA: Diagnosis not present

## 2017-11-19 DIAGNOSIS — J9611 Chronic respiratory failure with hypoxia: Secondary | ICD-10-CM | POA: Diagnosis not present

## 2017-11-19 DIAGNOSIS — M6281 Muscle weakness (generalized): Secondary | ICD-10-CM | POA: Diagnosis not present

## 2017-11-20 DIAGNOSIS — I10 Essential (primary) hypertension: Secondary | ICD-10-CM | POA: Diagnosis not present

## 2017-11-20 DIAGNOSIS — J449 Chronic obstructive pulmonary disease, unspecified: Secondary | ICD-10-CM | POA: Diagnosis not present

## 2017-11-20 DIAGNOSIS — E038 Other specified hypothyroidism: Secondary | ICD-10-CM | POA: Diagnosis not present

## 2017-11-20 DIAGNOSIS — C91Z0 Other lymphoid leukemia not having achieved remission: Secondary | ICD-10-CM | POA: Diagnosis not present

## 2017-11-22 DIAGNOSIS — J9611 Chronic respiratory failure with hypoxia: Secondary | ICD-10-CM | POA: Diagnosis not present

## 2017-11-22 DIAGNOSIS — C9111 Chronic lymphocytic leukemia of B-cell type in remission: Secondary | ICD-10-CM | POA: Diagnosis not present

## 2017-11-22 DIAGNOSIS — M6281 Muscle weakness (generalized): Secondary | ICD-10-CM | POA: Diagnosis not present

## 2017-11-22 DIAGNOSIS — J449 Chronic obstructive pulmonary disease, unspecified: Secondary | ICD-10-CM | POA: Diagnosis not present

## 2017-11-23 DIAGNOSIS — J449 Chronic obstructive pulmonary disease, unspecified: Secondary | ICD-10-CM | POA: Diagnosis not present

## 2017-11-23 DIAGNOSIS — J9611 Chronic respiratory failure with hypoxia: Secondary | ICD-10-CM | POA: Diagnosis not present

## 2017-11-23 DIAGNOSIS — M6281 Muscle weakness (generalized): Secondary | ICD-10-CM | POA: Diagnosis not present

## 2017-11-23 DIAGNOSIS — C9111 Chronic lymphocytic leukemia of B-cell type in remission: Secondary | ICD-10-CM | POA: Diagnosis not present

## 2017-11-24 DIAGNOSIS — J449 Chronic obstructive pulmonary disease, unspecified: Secondary | ICD-10-CM | POA: Diagnosis not present

## 2017-11-24 DIAGNOSIS — M6281 Muscle weakness (generalized): Secondary | ICD-10-CM | POA: Diagnosis not present

## 2017-11-24 DIAGNOSIS — J9611 Chronic respiratory failure with hypoxia: Secondary | ICD-10-CM | POA: Diagnosis not present

## 2017-11-24 DIAGNOSIS — C9111 Chronic lymphocytic leukemia of B-cell type in remission: Secondary | ICD-10-CM | POA: Diagnosis not present

## 2017-11-25 DIAGNOSIS — M6281 Muscle weakness (generalized): Secondary | ICD-10-CM | POA: Diagnosis not present

## 2017-11-25 DIAGNOSIS — J449 Chronic obstructive pulmonary disease, unspecified: Secondary | ICD-10-CM | POA: Diagnosis not present

## 2017-11-25 DIAGNOSIS — C9111 Chronic lymphocytic leukemia of B-cell type in remission: Secondary | ICD-10-CM | POA: Diagnosis not present

## 2017-11-25 DIAGNOSIS — J9611 Chronic respiratory failure with hypoxia: Secondary | ICD-10-CM | POA: Diagnosis not present

## 2017-11-26 DIAGNOSIS — Z79899 Other long term (current) drug therapy: Secondary | ICD-10-CM | POA: Diagnosis not present

## 2017-11-27 DIAGNOSIS — C9111 Chronic lymphocytic leukemia of B-cell type in remission: Secondary | ICD-10-CM | POA: Diagnosis not present

## 2017-11-27 DIAGNOSIS — J9611 Chronic respiratory failure with hypoxia: Secondary | ICD-10-CM | POA: Diagnosis not present

## 2017-11-27 DIAGNOSIS — J449 Chronic obstructive pulmonary disease, unspecified: Secondary | ICD-10-CM | POA: Diagnosis not present

## 2017-11-27 DIAGNOSIS — M6281 Muscle weakness (generalized): Secondary | ICD-10-CM | POA: Diagnosis not present

## 2017-12-01 DIAGNOSIS — J449 Chronic obstructive pulmonary disease, unspecified: Secondary | ICD-10-CM | POA: Diagnosis not present

## 2017-12-01 DIAGNOSIS — J9610 Chronic respiratory failure, unspecified whether with hypoxia or hypercapnia: Secondary | ICD-10-CM | POA: Diagnosis not present

## 2017-12-01 DIAGNOSIS — M47896 Other spondylosis, lumbar region: Secondary | ICD-10-CM | POA: Diagnosis not present

## 2017-12-01 DIAGNOSIS — M47894 Other spondylosis, thoracic region: Secondary | ICD-10-CM | POA: Diagnosis not present

## 2017-12-01 DIAGNOSIS — I10 Essential (primary) hypertension: Secondary | ICD-10-CM | POA: Diagnosis not present

## 2017-12-01 DIAGNOSIS — L89151 Pressure ulcer of sacral region, stage 1: Secondary | ICD-10-CM | POA: Diagnosis not present

## 2017-12-01 DIAGNOSIS — M47892 Other spondylosis, cervical region: Secondary | ICD-10-CM | POA: Diagnosis not present

## 2017-12-01 DIAGNOSIS — Z9981 Dependence on supplemental oxygen: Secondary | ICD-10-CM | POA: Diagnosis not present

## 2017-12-01 DIAGNOSIS — C9111 Chronic lymphocytic leukemia of B-cell type in remission: Secondary | ICD-10-CM | POA: Diagnosis not present

## 2017-12-01 DIAGNOSIS — D1724 Benign lipomatous neoplasm of skin and subcutaneous tissue of left leg: Secondary | ICD-10-CM | POA: Diagnosis not present

## 2017-12-02 ENCOUNTER — Ambulatory Visit: Payer: Medicare Other | Admitting: Oncology

## 2017-12-02 ENCOUNTER — Other Ambulatory Visit: Payer: Medicare Other

## 2017-12-02 DIAGNOSIS — D638 Anemia in other chronic diseases classified elsewhere: Secondary | ICD-10-CM | POA: Diagnosis not present

## 2017-12-02 DIAGNOSIS — R739 Hyperglycemia, unspecified: Secondary | ICD-10-CM | POA: Diagnosis not present

## 2017-12-02 DIAGNOSIS — I10 Essential (primary) hypertension: Secondary | ICD-10-CM | POA: Diagnosis not present

## 2017-12-02 DIAGNOSIS — C9111 Chronic lymphocytic leukemia of B-cell type in remission: Secondary | ICD-10-CM | POA: Diagnosis not present

## 2017-12-02 DIAGNOSIS — J449 Chronic obstructive pulmonary disease, unspecified: Secondary | ICD-10-CM | POA: Diagnosis not present

## 2017-12-02 DIAGNOSIS — E873 Alkalosis: Secondary | ICD-10-CM | POA: Diagnosis not present

## 2017-12-02 DIAGNOSIS — D696 Thrombocytopenia, unspecified: Secondary | ICD-10-CM | POA: Diagnosis not present

## 2017-12-02 DIAGNOSIS — E038 Other specified hypothyroidism: Secondary | ICD-10-CM | POA: Diagnosis not present

## 2017-12-02 DIAGNOSIS — L89151 Pressure ulcer of sacral region, stage 1: Secondary | ICD-10-CM | POA: Diagnosis not present

## 2017-12-02 DIAGNOSIS — J9610 Chronic respiratory failure, unspecified whether with hypoxia or hypercapnia: Secondary | ICD-10-CM | POA: Diagnosis not present

## 2017-12-02 DIAGNOSIS — Z9981 Dependence on supplemental oxygen: Secondary | ICD-10-CM | POA: Diagnosis not present

## 2017-12-02 DIAGNOSIS — C9110 Chronic lymphocytic leukemia of B-cell type not having achieved remission: Secondary | ICD-10-CM | POA: Diagnosis not present

## 2017-12-06 ENCOUNTER — Other Ambulatory Visit: Payer: Medicare Other

## 2017-12-06 ENCOUNTER — Ambulatory Visit: Payer: Medicare Other | Admitting: Oncology

## 2017-12-07 DIAGNOSIS — J9610 Chronic respiratory failure, unspecified whether with hypoxia or hypercapnia: Secondary | ICD-10-CM | POA: Diagnosis not present

## 2017-12-07 DIAGNOSIS — I10 Essential (primary) hypertension: Secondary | ICD-10-CM | POA: Diagnosis not present

## 2017-12-07 DIAGNOSIS — L89151 Pressure ulcer of sacral region, stage 1: Secondary | ICD-10-CM | POA: Diagnosis not present

## 2017-12-07 DIAGNOSIS — Z9981 Dependence on supplemental oxygen: Secondary | ICD-10-CM | POA: Diagnosis not present

## 2017-12-07 DIAGNOSIS — C9111 Chronic lymphocytic leukemia of B-cell type in remission: Secondary | ICD-10-CM | POA: Diagnosis not present

## 2017-12-07 DIAGNOSIS — J449 Chronic obstructive pulmonary disease, unspecified: Secondary | ICD-10-CM | POA: Diagnosis not present

## 2017-12-09 DIAGNOSIS — Z9981 Dependence on supplemental oxygen: Secondary | ICD-10-CM | POA: Diagnosis not present

## 2017-12-09 DIAGNOSIS — R609 Edema, unspecified: Secondary | ICD-10-CM | POA: Diagnosis not present

## 2017-12-09 DIAGNOSIS — L89151 Pressure ulcer of sacral region, stage 1: Secondary | ICD-10-CM | POA: Diagnosis not present

## 2017-12-09 DIAGNOSIS — K5909 Other constipation: Secondary | ICD-10-CM | POA: Diagnosis not present

## 2017-12-09 DIAGNOSIS — C9111 Chronic lymphocytic leukemia of B-cell type in remission: Secondary | ICD-10-CM | POA: Diagnosis not present

## 2017-12-09 DIAGNOSIS — J449 Chronic obstructive pulmonary disease, unspecified: Secondary | ICD-10-CM | POA: Diagnosis not present

## 2017-12-09 DIAGNOSIS — I10 Essential (primary) hypertension: Secondary | ICD-10-CM | POA: Diagnosis not present

## 2017-12-09 DIAGNOSIS — J9610 Chronic respiratory failure, unspecified whether with hypoxia or hypercapnia: Secondary | ICD-10-CM | POA: Diagnosis not present

## 2017-12-09 DIAGNOSIS — R0902 Hypoxemia: Secondary | ICD-10-CM | POA: Diagnosis not present

## 2017-12-09 DIAGNOSIS — Z79899 Other long term (current) drug therapy: Secondary | ICD-10-CM | POA: Diagnosis not present

## 2017-12-12 DIAGNOSIS — J9610 Chronic respiratory failure, unspecified whether with hypoxia or hypercapnia: Secondary | ICD-10-CM | POA: Diagnosis not present

## 2017-12-12 DIAGNOSIS — I10 Essential (primary) hypertension: Secondary | ICD-10-CM | POA: Diagnosis not present

## 2017-12-12 DIAGNOSIS — C9111 Chronic lymphocytic leukemia of B-cell type in remission: Secondary | ICD-10-CM | POA: Diagnosis not present

## 2017-12-12 DIAGNOSIS — L89151 Pressure ulcer of sacral region, stage 1: Secondary | ICD-10-CM | POA: Diagnosis not present

## 2017-12-12 DIAGNOSIS — J449 Chronic obstructive pulmonary disease, unspecified: Secondary | ICD-10-CM | POA: Diagnosis not present

## 2017-12-12 DIAGNOSIS — Z9981 Dependence on supplemental oxygen: Secondary | ICD-10-CM | POA: Diagnosis not present

## 2017-12-13 ENCOUNTER — Other Ambulatory Visit: Payer: Self-pay | Admitting: Internal Medicine

## 2017-12-14 DIAGNOSIS — Z9981 Dependence on supplemental oxygen: Secondary | ICD-10-CM | POA: Diagnosis not present

## 2017-12-14 DIAGNOSIS — I10 Essential (primary) hypertension: Secondary | ICD-10-CM | POA: Diagnosis not present

## 2017-12-14 DIAGNOSIS — L89151 Pressure ulcer of sacral region, stage 1: Secondary | ICD-10-CM | POA: Diagnosis not present

## 2017-12-14 DIAGNOSIS — C9111 Chronic lymphocytic leukemia of B-cell type in remission: Secondary | ICD-10-CM | POA: Diagnosis not present

## 2017-12-14 DIAGNOSIS — J449 Chronic obstructive pulmonary disease, unspecified: Secondary | ICD-10-CM | POA: Diagnosis not present

## 2017-12-14 DIAGNOSIS — J9610 Chronic respiratory failure, unspecified whether with hypoxia or hypercapnia: Secondary | ICD-10-CM | POA: Diagnosis not present

## 2017-12-16 DIAGNOSIS — J449 Chronic obstructive pulmonary disease, unspecified: Secondary | ICD-10-CM | POA: Diagnosis not present

## 2017-12-16 DIAGNOSIS — J9610 Chronic respiratory failure, unspecified whether with hypoxia or hypercapnia: Secondary | ICD-10-CM | POA: Diagnosis not present

## 2017-12-16 DIAGNOSIS — I10 Essential (primary) hypertension: Secondary | ICD-10-CM | POA: Diagnosis not present

## 2017-12-16 DIAGNOSIS — L89151 Pressure ulcer of sacral region, stage 1: Secondary | ICD-10-CM | POA: Diagnosis not present

## 2017-12-16 DIAGNOSIS — Z9981 Dependence on supplemental oxygen: Secondary | ICD-10-CM | POA: Diagnosis not present

## 2017-12-16 DIAGNOSIS — C9111 Chronic lymphocytic leukemia of B-cell type in remission: Secondary | ICD-10-CM | POA: Diagnosis not present

## 2017-12-17 DIAGNOSIS — M25552 Pain in left hip: Secondary | ICD-10-CM | POA: Diagnosis not present

## 2017-12-17 DIAGNOSIS — W19XXXA Unspecified fall, initial encounter: Secondary | ICD-10-CM | POA: Diagnosis not present

## 2017-12-18 ENCOUNTER — Inpatient Hospital Stay
Admission: EM | Admit: 2017-12-18 | Discharge: 2017-12-28 | DRG: 536 | Disposition: E | Attending: Internal Medicine | Admitting: Internal Medicine

## 2017-12-18 ENCOUNTER — Other Ambulatory Visit: Payer: Self-pay

## 2017-12-18 ENCOUNTER — Encounter: Payer: Self-pay | Admitting: Anesthesiology

## 2017-12-18 ENCOUNTER — Emergency Department

## 2017-12-18 DIAGNOSIS — Z66 Do not resuscitate: Secondary | ICD-10-CM | POA: Diagnosis present

## 2017-12-18 DIAGNOSIS — E890 Postprocedural hypothyroidism: Secondary | ICD-10-CM | POA: Diagnosis present

## 2017-12-18 DIAGNOSIS — Y92019 Unspecified place in single-family (private) house as the place of occurrence of the external cause: Secondary | ICD-10-CM | POA: Diagnosis not present

## 2017-12-18 DIAGNOSIS — Z8249 Family history of ischemic heart disease and other diseases of the circulatory system: Secondary | ICD-10-CM

## 2017-12-18 DIAGNOSIS — S72142A Displaced intertrochanteric fracture of left femur, initial encounter for closed fracture: Principal | ICD-10-CM | POA: Diagnosis present

## 2017-12-18 DIAGNOSIS — M810 Age-related osteoporosis without current pathological fracture: Secondary | ICD-10-CM | POA: Diagnosis not present

## 2017-12-18 DIAGNOSIS — I10 Essential (primary) hypertension: Secondary | ICD-10-CM | POA: Diagnosis present

## 2017-12-18 DIAGNOSIS — F0390 Unspecified dementia without behavioral disturbance: Secondary | ICD-10-CM | POA: Diagnosis present

## 2017-12-18 DIAGNOSIS — Z79890 Hormone replacement therapy (postmenopausal): Secondary | ICD-10-CM | POA: Diagnosis not present

## 2017-12-18 DIAGNOSIS — Z9981 Dependence on supplemental oxygen: Secondary | ICD-10-CM | POA: Diagnosis not present

## 2017-12-18 DIAGNOSIS — W1830XA Fall on same level, unspecified, initial encounter: Secondary | ICD-10-CM

## 2017-12-18 DIAGNOSIS — J9610 Chronic respiratory failure, unspecified whether with hypoxia or hypercapnia: Secondary | ICD-10-CM | POA: Diagnosis not present

## 2017-12-18 DIAGNOSIS — Z8781 Personal history of (healed) traumatic fracture: Secondary | ICD-10-CM

## 2017-12-18 DIAGNOSIS — E875 Hyperkalemia: Secondary | ICD-10-CM | POA: Diagnosis not present

## 2017-12-18 DIAGNOSIS — B0229 Other postherpetic nervous system involvement: Secondary | ICD-10-CM | POA: Diagnosis present

## 2017-12-18 DIAGNOSIS — C9110 Chronic lymphocytic leukemia of B-cell type not having achieved remission: Secondary | ICD-10-CM

## 2017-12-18 DIAGNOSIS — Z880 Allergy status to penicillin: Secondary | ICD-10-CM

## 2017-12-18 DIAGNOSIS — Z809 Family history of malignant neoplasm, unspecified: Secondary | ICD-10-CM | POA: Diagnosis not present

## 2017-12-18 DIAGNOSIS — Z9049 Acquired absence of other specified parts of digestive tract: Secondary | ICD-10-CM | POA: Diagnosis not present

## 2017-12-18 DIAGNOSIS — C9111 Chronic lymphocytic leukemia of B-cell type in remission: Secondary | ICD-10-CM | POA: Diagnosis not present

## 2017-12-18 DIAGNOSIS — Y92091 Bathroom in other non-institutional residence as the place of occurrence of the external cause: Secondary | ICD-10-CM

## 2017-12-18 DIAGNOSIS — L89151 Pressure ulcer of sacral region, stage 1: Secondary | ICD-10-CM | POA: Diagnosis not present

## 2017-12-18 DIAGNOSIS — D630 Anemia in neoplastic disease: Secondary | ICD-10-CM | POA: Diagnosis present

## 2017-12-18 DIAGNOSIS — I4891 Unspecified atrial fibrillation: Secondary | ICD-10-CM | POA: Diagnosis not present

## 2017-12-18 DIAGNOSIS — E785 Hyperlipidemia, unspecified: Secondary | ICD-10-CM | POA: Diagnosis not present

## 2017-12-18 DIAGNOSIS — Z888 Allergy status to other drugs, medicaments and biological substances status: Secondary | ICD-10-CM

## 2017-12-18 DIAGNOSIS — Z961 Presence of intraocular lens: Secondary | ICD-10-CM | POA: Diagnosis present

## 2017-12-18 DIAGNOSIS — S72009A Fracture of unspecified part of neck of unspecified femur, initial encounter for closed fracture: Secondary | ICD-10-CM | POA: Diagnosis not present

## 2017-12-18 DIAGNOSIS — J9611 Chronic respiratory failure with hypoxia: Secondary | ICD-10-CM | POA: Diagnosis present

## 2017-12-18 DIAGNOSIS — T370X5A Adverse effect of sulfonamides, initial encounter: Secondary | ICD-10-CM | POA: Diagnosis not present

## 2017-12-18 DIAGNOSIS — Z86010 Personal history of colonic polyps: Secondary | ICD-10-CM | POA: Diagnosis not present

## 2017-12-18 DIAGNOSIS — S7292XA Unspecified fracture of left femur, initial encounter for closed fracture: Secondary | ICD-10-CM | POA: Diagnosis not present

## 2017-12-18 DIAGNOSIS — D479 Neoplasm of uncertain behavior of lymphoid, hematopoietic and related tissue, unspecified: Secondary | ICD-10-CM | POA: Diagnosis present

## 2017-12-18 DIAGNOSIS — Z515 Encounter for palliative care: Secondary | ICD-10-CM

## 2017-12-18 DIAGNOSIS — Z90710 Acquired absence of both cervix and uterus: Secondary | ICD-10-CM | POA: Diagnosis not present

## 2017-12-18 DIAGNOSIS — Z79899 Other long term (current) drug therapy: Secondary | ICD-10-CM | POA: Diagnosis not present

## 2017-12-18 DIAGNOSIS — Z87891 Personal history of nicotine dependence: Secondary | ICD-10-CM

## 2017-12-18 DIAGNOSIS — M25552 Pain in left hip: Secondary | ICD-10-CM

## 2017-12-18 DIAGNOSIS — Z85850 Personal history of malignant neoplasm of thyroid: Secondary | ICD-10-CM

## 2017-12-18 DIAGNOSIS — Z993 Dependence on wheelchair: Secondary | ICD-10-CM

## 2017-12-18 DIAGNOSIS — W19XXXA Unspecified fall, initial encounter: Secondary | ICD-10-CM | POA: Diagnosis present

## 2017-12-18 DIAGNOSIS — S7290XA Unspecified fracture of unspecified femur, initial encounter for closed fracture: Secondary | ICD-10-CM | POA: Diagnosis present

## 2017-12-18 DIAGNOSIS — I959 Hypotension, unspecified: Secondary | ICD-10-CM | POA: Diagnosis not present

## 2017-12-18 DIAGNOSIS — J449 Chronic obstructive pulmonary disease, unspecified: Secondary | ICD-10-CM | POA: Diagnosis not present

## 2017-12-18 DIAGNOSIS — Z9842 Cataract extraction status, left eye: Secondary | ICD-10-CM | POA: Diagnosis not present

## 2017-12-18 DIAGNOSIS — E860 Dehydration: Secondary | ICD-10-CM | POA: Diagnosis present

## 2017-12-18 DIAGNOSIS — Z7951 Long term (current) use of inhaled steroids: Secondary | ICD-10-CM

## 2017-12-18 LAB — URINALYSIS, COMPLETE (UACMP) WITH MICROSCOPIC
BILIRUBIN URINE: NEGATIVE
Bacteria, UA: NONE SEEN
GLUCOSE, UA: NEGATIVE mg/dL
KETONES UR: NEGATIVE mg/dL
LEUKOCYTES UA: NEGATIVE
NITRITE: NEGATIVE
PH: 7 (ref 5.0–8.0)
Protein, ur: 30 mg/dL — AB
SPECIFIC GRAVITY, URINE: 1.013 (ref 1.005–1.030)
Squamous Epithelial / LPF: NONE SEEN

## 2017-12-18 LAB — CBC
HCT: 20.7 % — ABNORMAL LOW (ref 35.0–47.0)
HEMOGLOBIN: 6.2 g/dL — AB (ref 12.0–16.0)
MCH: 28.2 pg (ref 26.0–34.0)
MCHC: 29.7 g/dL — AB (ref 32.0–36.0)
MCV: 94.9 fL (ref 80.0–100.0)
Platelets: 116 10*3/uL — ABNORMAL LOW (ref 150–440)
RBC: 2.18 MIL/uL — ABNORMAL LOW (ref 3.80–5.20)
RDW: 17.8 % — ABNORMAL HIGH (ref 11.5–14.5)
WBC: 227.4 10*3/uL (ref 3.6–11.0)

## 2017-12-18 LAB — CBC WITH DIFFERENTIAL/PLATELET
BAND NEUTROPHILS: 0 %
BASOS PCT: 0 %
Basophils Absolute: 0 10*3/uL (ref 0–0.1)
Blasts: 0 %
EOS ABS: 0 10*3/uL (ref 0–0.7)
Eosinophils Relative: 0 %
HEMATOCRIT: 23.3 % — AB (ref 35.0–47.0)
HEMOGLOBIN: 7 g/dL — AB (ref 12.0–16.0)
LYMPHS PCT: 93 %
Lymphs Abs: 234.7 10*3/uL — ABNORMAL HIGH (ref 1.0–3.6)
MCH: 27.9 pg (ref 26.0–34.0)
MCHC: 30.2 g/dL — AB (ref 32.0–36.0)
MCV: 92.3 fL (ref 80.0–100.0)
MONO ABS: 7.6 10*3/uL — AB (ref 0.2–0.9)
MONOS PCT: 3 %
Metamyelocytes Relative: 0 %
Myelocytes: 0 %
NEUTROS ABS: 10.1 10*3/uL — AB (ref 1.4–6.5)
NEUTROS PCT: 4 %
NRBC: 1 /100{WBCs} — AB
OTHER: 0 %
Platelets: 139 10*3/uL — ABNORMAL LOW (ref 150–440)
Promyelocytes Absolute: 0 %
RBC: 2.53 MIL/uL — ABNORMAL LOW (ref 3.80–5.20)
RDW: 17.4 % — AB (ref 11.5–14.5)
WBC: 252.4 10*3/uL (ref 3.6–11.0)

## 2017-12-18 LAB — COMPREHENSIVE METABOLIC PANEL
ALT: 9 U/L — ABNORMAL LOW (ref 14–54)
ANION GAP: 5 (ref 5–15)
AST: 25 U/L (ref 15–41)
Albumin: 4 g/dL (ref 3.5–5.0)
Alkaline Phosphatase: 67 U/L (ref 38–126)
BILIRUBIN TOTAL: 0.7 mg/dL (ref 0.3–1.2)
BUN: 25 mg/dL — AB (ref 6–20)
CHLORIDE: 86 mmol/L — AB (ref 101–111)
CO2: 43 mmol/L — ABNORMAL HIGH (ref 22–32)
Calcium: 10 mg/dL (ref 8.9–10.3)
Creatinine, Ser: 0.82 mg/dL (ref 0.44–1.00)
GFR calc non Af Amer: >60 mL/min (ref >60)
Glucose, Bld: 105 mg/dL — ABNORMAL HIGH (ref 65–99)
POTASSIUM: 5.4 mmol/L — AB (ref 3.5–5.1)
Sodium: 134 mmol/L — ABNORMAL LOW (ref 135–145)
TOTAL PROTEIN: 6.6 g/dL (ref 6.5–8.1)

## 2017-12-18 LAB — BASIC METABOLIC PANEL
ANION GAP: 2 — AB (ref 5–15)
ANION GAP: 3 — AB (ref 5–15)
BUN: 23 mg/dL — ABNORMAL HIGH (ref 6–20)
BUN: 23 mg/dL — AB (ref 6–20)
CALCIUM: 9.3 mg/dL (ref 8.9–10.3)
CHLORIDE: 89 mmol/L — AB (ref 101–111)
CO2: 43 mmol/L — AB (ref 22–32)
CO2: 42 mmol/L — ABNORMAL HIGH (ref 22–32)
CREATININE: 0.82 mg/dL (ref 0.44–1.00)
Calcium: 9.8 mg/dL (ref 8.9–10.3)
Chloride: 87 mmol/L — ABNORMAL LOW (ref 101–111)
Creatinine, Ser: 0.83 mg/dL (ref 0.44–1.00)
GFR calc Af Amer: >60 mL/min (ref >60)
GFR calc Af Amer: >60 mL/min (ref >60)
GFR calc non Af Amer: >60 mL/min (ref >60)
GFR calc non Af Amer: >60 mL/min (ref >60)
GLUCOSE: 94 mg/dL (ref 65–99)
Glucose, Bld: 101 mg/dL — ABNORMAL HIGH (ref 65–99)
POTASSIUM: 6.8 mmol/L — AB (ref 3.5–5.1)
Potassium: 6.9 mmol/L (ref 3.5–5.1)
SODIUM: 134 mmol/L — AB (ref 135–145)
Sodium: 132 mmol/L — ABNORMAL LOW (ref 135–145)

## 2017-12-18 LAB — PREPARE RBC (CROSSMATCH)

## 2017-12-18 LAB — SURGICAL PCR SCREEN
MRSA, PCR: NEGATIVE
Staphylococcus aureus: POSITIVE — AB

## 2017-12-18 LAB — MRSA PCR SCREENING: MRSA by PCR: NEGATIVE

## 2017-12-18 LAB — HEMOGLOBIN: Hemoglobin: 6.8 g/dL — ABNORMAL LOW (ref 12.0–16.0)

## 2017-12-18 LAB — PROTIME-INR
INR: 1.1
PROTHROMBIN TIME: 14.1 s (ref 11.4–15.2)

## 2017-12-18 LAB — GLUCOSE, CAPILLARY
GLUCOSE-CAPILLARY: 97 mg/dL (ref 65–99)
Glucose-Capillary: 97 mg/dL (ref 65–99)
Glucose-Capillary: 102 mg/dL — ABNORMAL HIGH (ref 65–99)

## 2017-12-18 LAB — ABO/RH: ABO/RH(D): B POS

## 2017-12-18 LAB — POTASSIUM: Potassium: 5.5 mmol/L — ABNORMAL HIGH (ref 3.5–5.1)

## 2017-12-18 MED ORDER — GLYCOPYRROLATE 0.2 MG/ML IJ SOLN: 0.2000 mg | INTRAMUSCULAR | Status: DC | PRN

## 2017-12-18 MED ORDER — SODIUM CHLORIDE 0.9 % IV SOLN
1.0000 g | Freq: Once | INTRAVENOUS | Status: AC
  Administered 2017-12-18: 1 g via INTRAVENOUS
  Filled 2017-12-18: qty 10

## 2017-12-18 MED ORDER — SODIUM POLYSTYRENE SULFONATE 15 GM/60ML PO SUSP
60.0000 g | ORAL | Status: DC
  Filled 2017-12-18: qty 240

## 2017-12-18 MED ORDER — SODIUM POLYSTYRENE SULFONATE 15 GM/60ML PO SUSP
60.0000 g | ORAL | Status: AC
  Administered 2017-12-18: 60 g via RECTAL
  Filled 2017-12-18: qty 240

## 2017-12-18 MED ORDER — AMIODARONE HCL IN DEXTROSE 360-4.14 MG/200ML-% IV SOLN: 30.0000 mg/h | INTRAVENOUS | Status: DC

## 2017-12-18 MED ORDER — MORPHINE BOLUS VIA INFUSION
2.0000 mg | INTRAVENOUS | Status: DC | PRN
  Administered 2017-12-18: 2 mg via INTRAVENOUS
  Filled 2017-12-18: qty 2

## 2017-12-18 MED ORDER — SODIUM POLYSTYRENE SULFONATE 15 GM/60ML PO SUSP
30.0000 g | Freq: Once | ORAL | Status: AC
  Administered 2017-12-18: 30 g via ORAL
  Filled 2017-12-18: qty 120

## 2017-12-18 MED ORDER — FENTANYL CITRATE (PF) 100 MCG/2ML IJ SOLN
INTRAMUSCULAR | Status: AC
  Filled 2017-12-18: qty 2

## 2017-12-18 MED ORDER — MORPHINE SULFATE (PF) 2 MG/ML IV SOLN
INTRAVENOUS | Status: AC
  Administered 2017-12-18: 2 mg via INTRAVENOUS
  Filled 2017-12-18: qty 1

## 2017-12-18 MED ORDER — GABAPENTIN 400 MG PO CAPS: 400.0000 mg | ORAL_CAPSULE | Freq: Two times a day (BID) | ORAL | Status: DC

## 2017-12-18 MED ORDER — SODIUM CHLORIDE 0.9 % IV SOLN
INTRAVENOUS | Status: DC
  Administered 2017-12-18: 04:00:00 via INTRAVENOUS

## 2017-12-18 MED ORDER — LORAZEPAM 2 MG/ML IJ SOLN: 1.0000 mg | INTRAMUSCULAR | Status: DC | PRN

## 2017-12-18 MED ORDER — NOREPINEPHRINE BITARTRATE 1 MG/ML IV SOLN
0.0000 ug/min | INTRAVENOUS | Status: DC
  Filled 2017-12-18: qty 4

## 2017-12-18 MED ORDER — SENNOSIDES-DOCUSATE SODIUM 8.6-50 MG PO TABS
1.0000 | ORAL_TABLET | Freq: Every evening | ORAL | Status: DC | PRN
Start: 2017-12-18 — End: 2017-12-18

## 2017-12-18 MED ORDER — MORPHINE SULFATE (PF) 2 MG/ML IV SOLN
1.0000 mg | INTRAVENOUS | Status: DC | PRN
  Administered 2017-12-18: 2 mg via INTRAVENOUS

## 2017-12-18 MED ORDER — AMIODARONE HCL IN DEXTROSE 360-4.14 MG/200ML-% IV SOLN
60.0000 mg/h | INTRAVENOUS | Status: DC
  Administered 2017-12-18: 60 mg/h via INTRAVENOUS
  Filled 2017-12-18: qty 200

## 2017-12-18 MED ORDER — FENTANYL CITRATE (PF) 100 MCG/2ML IJ SOLN
25.0000 ug | Freq: Once | INTRAMUSCULAR | Status: AC
  Administered 2017-12-18: 25 ug via INTRAVENOUS

## 2017-12-18 MED ORDER — SODIUM CHLORIDE 0.9 % IV SOLN: Freq: Once | INTRAVENOUS | Status: DC

## 2017-12-18 MED ORDER — DEXTROSE-NACL 5-0.9 % IV SOLN
INTRAVENOUS | Status: DC
  Administered 2017-12-18: 06:00:00 via INTRAVENOUS

## 2017-12-18 MED ORDER — SODIUM BICARBONATE 8.4 % IV SOLN: 100.0000 meq | Freq: Once | INTRAVENOUS | Status: DC

## 2017-12-18 MED ORDER — SODIUM CHLORIDE 0.9 % IV SOLN
INTRAVENOUS | Status: DC
  Administered 2017-12-18: 14:00:00 via INTRAVENOUS

## 2017-12-18 MED ORDER — ACETAMINOPHEN 325 MG PO TABS: 650.0000 mg | ORAL_TABLET | Freq: Four times a day (QID) | ORAL | Status: DC | PRN

## 2017-12-18 MED ORDER — ENSURE ENLIVE PO LIQD: 237.0000 mL | Freq: Two times a day (BID) | ORAL | Status: DC

## 2017-12-18 MED ORDER — LEVOTHYROXINE SODIUM 50 MCG PO TABS: 150.0000 ug | ORAL_TABLET | Freq: Every day | ORAL | Status: DC

## 2017-12-18 MED ORDER — UMECLIDINIUM BROMIDE 62.5 MCG/INH IN AEPB
1.0000 | INHALATION_SPRAY | Freq: Every day | RESPIRATORY_TRACT | Status: DC
  Administered 2017-12-18: 17:00:00 1 via RESPIRATORY_TRACT
  Filled 2017-12-18: qty 7

## 2017-12-18 MED ORDER — MORPHINE SULFATE (PF) 2 MG/ML IV SOLN
2.0000 mg | INTRAVENOUS | Status: DC | PRN
  Administered 2017-12-18 (×2): 2 mg via INTRAVENOUS
  Filled 2017-12-18 (×2): qty 1

## 2017-12-18 MED ORDER — AMIODARONE IV BOLUS ONLY 150 MG/100ML
150.0000 mg | Freq: Once | INTRAVENOUS | Status: AC
  Administered 2017-12-18: 150 mg via INTRAVENOUS
  Filled 2017-12-18: qty 100

## 2017-12-18 MED ORDER — ACETAMINOPHEN 650 MG RE SUPP: 650.0000 mg | Freq: Four times a day (QID) | RECTAL | Status: DC | PRN

## 2017-12-18 MED ORDER — ONDANSETRON HCL 4 MG/2ML IJ SOLN: 4.0000 mg | Freq: Four times a day (QID) | INTRAMUSCULAR | Status: DC | PRN

## 2017-12-18 MED ORDER — LOSARTAN POTASSIUM 50 MG PO TABS: 100.0000 mg | ORAL_TABLET | Freq: Every day | ORAL | Status: DC

## 2017-12-18 MED ORDER — DEXTROSE 50 % IV SOLN: 25.0000 mL | Freq: Once | INTRAVENOUS | Status: DC

## 2017-12-18 MED ORDER — ONDANSETRON HCL 4 MG PO TABS: 4.0000 mg | ORAL_TABLET | Freq: Four times a day (QID) | ORAL | Status: DC | PRN

## 2017-12-18 MED ORDER — SODIUM CHLORIDE 0.9 % IV SOLN
1.0000 mg/h | INTRAVENOUS | Status: DC
  Administered 2017-12-18: 2 mg/h via INTRAVENOUS
  Filled 2017-12-18: qty 10

## 2017-12-18 MED ORDER — TRAMADOL HCL 50 MG PO TABS: 50.0000 mg | ORAL_TABLET | Freq: Four times a day (QID) | ORAL | Status: DC | PRN

## 2017-12-18 MED ORDER — INSULIN ASPART 100 UNIT/ML IV SOLN
10.0000 [IU] | Freq: Once | INTRAVENOUS | Status: DC
  Filled 2017-12-18: qty 0.1

## 2017-12-18 NOTE — Progress Notes (Signed)
Patient expressing hopelessness, no thoughts of harming self, but states, "I just wish I could go ahead and die." Patient's sister at bedside states that patient has been saying this for about a month now. Sister also states that patient used to go to the dining hall for meals and has lost interest in that.

## 2017-12-18 NOTE — ED Notes (Signed)
Patient's sister and POA Vickii Chafe Pichard called for status update on patient. Information provided with patient permission - patient verified that her sister is POA. Sister/POS contact number is [phone]

## 2017-12-18 NOTE — Consult Note (Signed)
Hematology/Oncology Consult note [facility] Telephone:([phone] [phone]  Patient Care Team: [facility] as PCP - General (Geriatric Medicine) [name], MD as Consulting Physician (General Surgery)   Name of the patient: Lisa Figueroa  [id number]  1932-02-25   Date of visit: 12/14/2017 REASON FOR COSULTATION:  Evaluation of CLL History of presenting illness-  Patient is a 83 year old female with no history of CLL, hypertension hyperlipidemia, thyroid cancer, who was sent to emergency room after patient lost balance and fell. The patient landed on her left hip and left side. Patient reports feeling pain of the left hip and the left thigh area. In the emergency room x-ray of the left lower extremity showed femur fracture. Patient's labs showed significantly elevated WBC, with predominantly neutrophilia and lymphocytosis. Oncology was consulted for CLL. Patient has been evaluated by orthopedic surgeon and planned to have surgery done tomorrow morning. Patient's family members at bedside including sisters and nieces. Family member told me that patient has elected to hospice treatment due to multiple comorbidity, but patient wants to have fracture fixed. Currently patient still a bit drowsy after receiving pain medication. She is alert enough to continue conversation.   Review of systems- ROS Deferred due to patient's mental status. Allergies  Allergen Reactions  . Penicillin G Swelling    .Has patient had a PCN reaction causing immediate rash, facial/tongue/throat swelling, SOB or lightheadedness with hypotension: Yes Has patient had a PCN reaction causing severe rash involving mucus membranes or skin necrosis: Unknown Has patient had a PCN reaction that required hospitalization: Unknown Has patient had a PCN reaction occurring within the last 10 years: Unknown If all of the above answers are "NO", then may proceed with  Cephalosporin use.   . Prednisone Other (See Comments)    crys and can't sleep crys and can't sleep    Patient Active Problem List   Diagnosis Date Noted  . Femur fracture ([place]) 12/05/2017  . Cognitive deficits 09/11/2017  . Bilateral leg weakness 08/10/2017  . Generalized weakness 08/10/2017  . Rib fracture 02/15/2017  . Chronic respiratory failure with hypoxia, on home O2 therapy ([place]) 10/16/2016  . Hospital discharge follow-up 10/16/2016  . Allergic rhinitis 07/03/2016  . CLL (chronic lymphocytic leukemia) ([place]) 02/02/2016  . History of bad fall 06/20/2015  . Medicare annual wellness visit, subsequent 05/22/2015  . Essential hypertension 11/19/2014  . Hyperlipidemia 11/19/2014  . Osteoporosis 11/18/2014  . S/P thyroidectomy 11/18/2014  . Postsurgical hypothyroidism 11/18/2014  . Post-herpetic trigeminal neuralgia 11/15/2014  . Need for prophylactic vaccination against Streptococcus pneumoniae (pneumococcus) 08/08/2014  . Abnormal mammogram 05/18/2013     Past Medical History:  Diagnosis Date  . CLL (chronic lymphocytic leukemia) ([place])   . History of colon polyps   . History of shingles   . Hyperlipidemia   . Hypertension   . Post herpetic neuralgia   . Shingles   . Thyroid cancer ([place]) 1998  . Thyroid cancer [facility])      Past Surgical History:  Procedure Laterality Date  . ABDOMINAL HYSTERECTOMY    . APPENDECTOMY    . BREAST SURGERY Right    cyst excision  . CARDIAC CATHETERIZATION  05/18/2008   At [facility]: mild 30% stenosis in LCX. normal EF.    Marland Kitchen CATARACT EXTRACTION W/PHACO Left 06/04/2015   Procedure: CATARACT EXTRACTION PHACO AND INTRAOCULAR LENS PLACEMENT (IOC);  Surgeon: Birder Robson, MD;  Location: [facility] ORS;  Service: Ophthalmology;  Laterality: Left;  [place] 00:34.1 AP% 23.0 CDE  7.85  . CHOLECYSTECTOMY    . HEMORRHOID SURGERY    . TOTAL THYROIDECTOMY  1998    Social History   Socioeconomic History  . Marital status: Married    Spouse name: Not on  file  . Number of children: Not on file  . Years of education: Not on file  . Highest education level: Not on file  Social Needs  . Financial resource strain: Not on file  . Food insecurity - worry: Not on file  . Food insecurity - inability: Not on file  . Transportation needs - medical: Not on file  . Transportation needs - non-medical: Not on file  Occupational History  . Occupation: retired  Tobacco Use  . Smoking status: Former Smoker    Packs/day: 1.00    Years: 20.00    Pack years: 20.00    Last attempt to quit: 08/08/1984    Years since quitting: 33.3  . Smokeless tobacco: Never Used  Substance and Sexual Activity  . Alcohol use: Yes    Alcohol/week: 0.0 oz    Comment: wine glass daily  . Drug use: No  . Sexual activity: No  Other Topics Concern  . Not on file  Social History Narrative  . Not on file     Family History  Problem Relation Age of Onset  . Cancer Brother        prostate and bone  . Heart disease Mother   . Heart disease Father   . Cancer Maternal Aunt        breast  . Cancer Cousin        breast - maternal side     Current Facility-Administered Medications:  .  0.9 %  sodium chloride infusion, , Intravenous, Once, Pyreddy, Pavan, MD .  0.9 %  sodium chloride infusion, , Intravenous, Continuous, Dustin Flock, MD, Last Rate: 75 mL/hr at 12/07/2017 1330 .  acetaminophen (TYLENOL) tablet 650 mg, 650 mg, Oral, Q6H PRN **OR** acetaminophen (TYLENOL) suppository 650 mg, 650 mg, Rectal, Q6H PRN, Pyreddy, Pavan, MD .  calcium gluconate 1 g in sodium chloride 0.9 % 100 mL IVPB, 1 g, Intravenous, Once, Dustin Flock, MD .  dextrose 50 % solution 25 mL, 25 mL, Intravenous, Once, Dustin Flock, MD .  dextrose 50 % solution 25 mL, 25 mL, Intravenous, Once, Lenis Noon, RPH .  feeding supplement (ENSURE ENLIVE) (ENSURE ENLIVE) liquid 237 mL, 237 mL, Oral, BID BM, Pyreddy, Pavan, MD .  gabapentin (NEURONTIN) capsule 400 mg, 400 mg, Oral, BID, Pyreddy,  Pavan, MD .  insulin aspart (novoLOG) injection 10 Units, 10 Units, Intravenous, Once, Dustin Flock, MD .  levothyroxine (SYNTHROID, LEVOTHROID) tablet 150 mcg, 150 mcg, Oral, QAC breakfast, Pyreddy, Pavan, MD .  morphine 2 MG/ML injection 2 mg, 2 mg, Intravenous, Q4H PRN, Pyreddy, Pavan, MD, 2 mg at 12/02/2017 2725 .  ondansetron (ZOFRAN) tablet 4 mg, 4 mg, Oral, Q6H PRN **OR** ondansetron (ZOFRAN) injection 4 mg, 4 mg, Intravenous, Q6H PRN, Pyreddy, Pavan, MD .  senna-docusate (Senokot-S) tablet 1 tablet, 1 tablet, Oral, QHS PRN, Pyreddy, Pavan, MD .  sodium bicarbonate injection 100 mEq, 100 mEq, Intravenous, Once, Dustin Flock, MD .  traMADol (ULTRAM) tablet 50 mg, 50 mg, Oral, Q6H PRN, Pyreddy, Pavan, MD .  umeclidinium bromide (INCRUSE ELLIPTA) 62.5 MCG/INH 1 puff, 1 puff, Inhalation, Daily, Saundra Shelling, MD   Physical exam:  Vitals:   12/17/2017 0934 12/01/2017 1001 12/03/2017 1033 12/03/2017 1100  BP: (!) 92/47 (!) 87/43 108/61 Marland Kitchen)  98/53  Pulse: 72 69  74  Resp: 16 14 (!) 22 15  Temp: (!) 97.5 F (36.4 C) 98.5 F (36.9 C) 97.8 F (36.6 C)   TempSrc: Oral Axillary Oral   SpO2: 92% 95% 95% 97%  Weight:   145 lb 8.1 oz (66 kg)   Height:   5\' 5"  (1.651 m)    GENERAL:Alert, no distress and comfortable.  EYES: no pallor or icterus OROPHARYNX: no thrush or ulceration; NECK: supple, no masses felt LYMPH:  no palpable lymphadenopathy in the cervical, axillary or inguinal regions LUNGS: clear to auscultation and  No wheeze or crackles HEART/CVS: regular rate & rhythm and no murmurs; No lower extremity edema ABDOMEN: abdomen soft, non-tender and normal bowel sounds Musculoskeletal:no cyanosis of digits and no clubbing  PSYCH: Drowsy NEURO: Deferred examination. Not following order. SKIN:  no rashes or significant lesions     CMP Latest Ref Rng & Units 11/27/2017  Glucose 65 - 99 mg/dL 101(H)  BUN 6 - 20 mg/dL 23(H)  Creatinine 0.44 - 1.00 mg/dL 0.83  Sodium 135 - 145 mmol/L  134(L)  Potassium 3.5 - 5.1 mmol/L 6.8(HH)  Chloride 101 - 111 mmol/L 89(L)  CO2 22 - 32 mmol/L 42(H)  Calcium 8.9 - 10.3 mg/dL 9.8  Total Protein 6.5 - 8.1 g/dL -  Total Bilirubin 0.3 - 1.2 mg/dL -  Alkaline Phos 38 - 126 U/L -  AST 15 - 41 U/L -  ALT 14 - 54 U/L -   CBC Latest Ref Rng & Units 11/27/2017  WBC 3.6 - 11.0 K/uL 227.4(HH)  Hemoglobin 12.0 - 16.0 g/dL 6.2(L)  Hematocrit 35.0 - 47.0 % 20.7(L)  Platelets 150 - 440 K/uL 116(L)     Dg Chest 1 View  Result Date: 12/17/2017 CLINICAL DATA:  Status post unwitnessed fall, with concern for chest injury. Initial encounter. EXAM: CHEST 1 VIEW COMPARISON:  Chest radiograph and CTA of the chest performed 08/10/2017 FINDINGS: The lungs are well-aerated. Mild right midlung and right basilar airspace opacities may reflect atelectasis or possibly mild infection. Vascular congestion may be transient in nature. There is no evidence of pleural effusion or pneumothorax. The cardiomediastinal silhouette is mildly enlarged. No acute osseous abnormalities are seen. IMPRESSION: 1. No displaced rib fracture seen. 2. Mild right midlung and right basilar airspace opacities may reflect atelectasis or possibly mild infection. 3. Vascular congestion may be transient in nature. Mild cardiomegaly. Electronically Signed   By: Garald Balding M.D.   On: 12/16/2017 01:46   Dg Lumbar Spine 2-3 Views  Result Date: 12/10/2017 CLINICAL DATA:  84 year old female with fall and left femoral fracture. EXAM: LUMBAR SPINE - 2-3 VIEW COMPARISON:  None. FINDINGS: There is no acute fracture or subluxation of the lumbar spine. The vertebral body heights are maintained. The bones are osteopenic. There is advanced atherosclerotic calcification of the visualized aorta. IMPRESSION: 1. No acute fracture or subluxation of the lumbar spine. 2. Atherosclerotic calcification of the aorta. Electronically Signed   By: Anner Crete M.D.   On: 12/09/2017 01:45   Dg Hip Unilat With  Pelvis 2-3 Views Left  Result Date: 12/05/2017 CLINICAL DATA:  83 year old female with fall and left hip pain. EXAM: DG HIP (WITH OR WITHOUT PELVIS) 2-3V LEFT COMPARISON:  None. FINDINGS: There is a comminuted fracture of the proximal left femur with intratrochanteric and sub trochanteric fractures. There is medial angulation of the femoral shaft in relation to the neck of the femur. There is displaced fracture fragment of the  lesser trochanter. The left femoral head remains in anatomic alignment with the acetabulum. No other acute fracture. Probable old fracture deformity of the left pubic bone. The bones are osteopenic. The soft tissues appear unremarkable. IMPRESSION: Comminuted, displaced, and angulated fracture of the proximal left femur with intertrochanteric and sub trochanteric involvement. No dislocation. Electronically Signed   By: Anner Crete M.D.   On: 11/29/2017 01:43    Assessment and plan- Patient is a 83 y.o. female with multiple comorbidities including CLL present with femur fracture. Labs showed significantly elevated white count.  # Leukocytosis, dominantly neutrophilia as well as leukocytosis, likely a combination of progressed monoclonal lymphocytosis plus reactive leukocytosis. She was last seen by Dr. Grayland Ormond in the clinic in September 2018. No intervention was decided at that point. Currently patient is not in great shape to receive induction chemotherapy for CLL, and leukostasis is rare until white count is above 400,000. Continue supportive care. If persistently elevated more than 400,000, and may consider hydrea for cytoreduction.   # Anemia and thrombocytopenia, possibly secondary to progression of CLL. Agree with transfusion. # Hyperkalemia, recommend slow blood transfusion rate. Possible pseudohyperkalemia, I recommend re-test using heparinized ABG kits for whole blood potassium measurements which could prevent the coagulation of the blood and the extensive release of  potassium from the CLL cells  Code status: DNR.   Thank you for this kind referral and the opportunity to participate in the care of this patient  Earlie Server, MD, PhD Hematology Oncology [facility] Pager- [phone] 11/29/2017

## 2017-12-18 NOTE — Consult Note (Signed)
ORTHOPAEDIC CONSULTATION  PATIENT NAME: Lisa Figueroa DOB: 09-27-1932  MRN: [id number]  REQUESTING PHYSICIAN: Dustin Flock, MD  Chief Complaint: left hip intertrochanteric fracture  HPI: Lisa Figueroa is a 83 y.o. female who is minimal household ambulator who fell last night. She sustained a displaced  left hip intertrochanteric fracture with subtrochanteric extension. Patient is accompanied by her sister. She denies pain anywhere else in her body. She denies any loss of consciousness. Patient is currently admitted to hospitalist service at [facility] and orthopedic service is consulted for management of left hip fracture.  Past Medical History:  Diagnosis Date  . CLL (chronic lymphocytic leukemia) ([place])   . History of colon polyps   . History of shingles   . Hyperlipidemia   . Hypertension   . Post herpetic neuralgia   . Shingles   . Thyroid cancer ([place]) 1998  . Thyroid cancer [facility])    Past Surgical History:  Procedure Laterality Date  . ABDOMINAL HYSTERECTOMY    . APPENDECTOMY    . BREAST SURGERY Right    cyst excision  . CARDIAC CATHETERIZATION  05/18/2008   At [facility]: mild 30% stenosis in LCX. normal EF.    Marland Kitchen CATARACT EXTRACTION W/PHACO Left 06/04/2015   Procedure: CATARACT EXTRACTION PHACO AND INTRAOCULAR LENS PLACEMENT (IOC);  Surgeon: Birder Robson, MD;  Location: [facility] ORS;  Service: Ophthalmology;  Laterality: Left;  [place] 00:34.1 AP% 23.0 CDE 7.85  . CHOLECYSTECTOMY    . HEMORRHOID SURGERY    . TOTAL THYROIDECTOMY  1998   Social History   Socioeconomic History  . Marital status: Married    Spouse name: None  . Number of children: None  . Years of education: None  . Highest education level: None  Social Needs  . Financial resource strain: None  . Food insecurity - worry: None  . Food insecurity - inability: None  . Transportation needs - medical: None  . Transportation needs - non-medical: None  Occupational History  . Occupation: retired  Tobacco Use  .  Smoking status: Former Smoker    Packs/day: 1.00    Years: 20.00    Pack years: 20.00    Last attempt to quit: 08/08/1984    Years since quitting: 33.3  . Smokeless tobacco: Never Used  Substance and Sexual Activity  . Alcohol use: Yes    Alcohol/week: 0.0 oz    Comment: wine glass daily  . Drug use: No  . Sexual activity: No  Other Topics Concern  . None  Social History Narrative  . None   Family History  Problem Relation Age of Onset  . Cancer Brother        prostate and bone  . Heart disease Mother   . Heart disease Father   . Cancer Maternal Aunt        breast  . Cancer Cousin        breast - maternal side   Allergies  Allergen Reactions  . Penicillin G Swelling    .Has patient had a PCN reaction causing immediate rash, facial/tongue/throat swelling, SOB or lightheadedness with hypotension: Yes Has patient had a PCN reaction causing severe rash involving mucus membranes or skin necrosis: Unknown Has patient had a PCN reaction that required hospitalization: Unknown Has patient had a PCN reaction occurring within the last 10 years: Unknown If all of the above answers are "NO", then may proceed with Cephalosporin use.   . Prednisone Other (See Comments)    crys and can't sleep crys and  can't sleep   Prior to Admission medications   Medication Sig Start Date End Date Taking? Authorizing Provider  acetaminophen (TYLENOL) 500 MG tablet Take 500 mg by mouth daily.    [provider]  Cholecalciferol (VITAMIN D) 2000 units CAPS Take by mouth daily.    [provider]  feeding supplement, ENSURE ENLIVE, (ENSURE ENLIVE) LIQD Take 237 mLs by mouth 2 (two) times daily between meals. 10/07/16   Hower, Aaron Mose, MD  gabapentin (NEURONTIN) 400 MG capsule Take 1 capsule (400 mg total) by mouth 2 (two) times daily. 08/16/17   Crecencio Mc, MD  INCRUSE ELLIPTA 62.5 MCG/INH AEPB INHALE 1 PUFF INTO THE LUNGS ONCE A DAY 07/05/17   Laverle Hobby, MD   levothyroxine (SYNTHROID, LEVOTHROID) 150 MCG tablet TAKE 1 TABLET BY MOUTH EVERY DAY 12/13/17   Crecencio Mc, MD  losartan (COZAAR) 100 MG tablet Take 1 tablet (100 mg total) by mouth daily. FOR HYPERTENSION 08/16/17   Crecencio Mc, MD  OXYGEN Inhale 2 L into the lungs. 2 liters @ night    [provider]  sulfamethoxazole-trimethoprim (BACTRIM DS,SEPTRA DS) 800-160 MG tablet Take 1 tablet by mouth 2 (two) times daily. 10/22/17   Crecencio Mc, MD  traMADol (ULTRAM) 50 MG tablet Take 1 tablet (50 mg total) by mouth every 6 (six) hours as needed. 11/17/17   Crecencio Mc, MD   Dg Chest 1 View  Result Date: 12/14/2017 CLINICAL DATA:  Status post unwitnessed fall, with concern for chest injury. Initial encounter. EXAM: CHEST 1 VIEW COMPARISON:  Chest radiograph and CTA of the chest performed 08/10/2017 FINDINGS: The lungs are well-aerated. Mild right midlung and right basilar airspace opacities may reflect atelectasis or possibly mild infection. Vascular congestion may be transient in nature. There is no evidence of pleural effusion or pneumothorax. The cardiomediastinal silhouette is mildly enlarged. No acute osseous abnormalities are seen. IMPRESSION: 1. No displaced rib fracture seen. 2. Mild right midlung and right basilar airspace opacities may reflect atelectasis or possibly mild infection. 3. Vascular congestion may be transient in nature. Mild cardiomegaly. Electronically Signed   By: Garald Balding M.D.   On: 12/13/2017 01:46   Dg Lumbar Spine 2-3 Views  Result Date: 11/30/2017 CLINICAL DATA:  83 year old female with fall and left femoral fracture. EXAM: LUMBAR SPINE - 2-3 VIEW COMPARISON:  None. FINDINGS: There is no acute fracture or subluxation of the lumbar spine. The vertebral body heights are maintained. The bones are osteopenic. There is advanced atherosclerotic calcification of the visualized aorta. IMPRESSION: 1. No acute fracture or subluxation of the lumbar spine.  2. Atherosclerotic calcification of the aorta. Electronically Signed   By: Anner Crete M.D.   On: 12/07/2017 01:45   Dg Hip Unilat With Pelvis 2-3 Views Left  Result Date: 12/23/2017 CLINICAL DATA:  83 year old female with fall and left hip pain. EXAM: DG HIP (WITH OR WITHOUT PELVIS) 2-3V LEFT COMPARISON:  None. FINDINGS: There is a comminuted fracture of the proximal left femur with intratrochanteric and sub trochanteric fractures. There is medial angulation of the femoral shaft in relation to the neck of the femur. There is displaced fracture fragment of the lesser trochanter. The left femoral head remains in anatomic alignment with the acetabulum. No other acute fracture. Probable old fracture deformity of the left pubic bone. The bones are osteopenic. The soft tissues appear unremarkable. IMPRESSION: Comminuted, displaced, and angulated fracture of the proximal left femur with intertrochanteric and sub trochanteric involvement. No dislocation.  Electronically Signed   By: Anner Crete M.D.   On: 11/27/2017 01:43    Positive ROS: All other systems have been reviewed and were otherwise negative with the exception of those mentioned in the HPI and as above.  Physical Exam: General: Well developed, well nourished female seen in no acute distress. HEENT: Atraumatic and normocephalic. Sclera are clear. Extraocular motion is intact. Oropharynx is clear with moist mucosa. Neck: Supple, nontender, good range of motion. No JVD or carotid bruits. Lungs: Clear to auscultation bilaterally. Cardiovascular: Regular rate and rhythm with normal S1 and S2. No murmurs. No gallops or rubs. Pedal pulses are palpable bilaterally. Homans test is negative bilaterally. No significant pretibial or ankle edema. Abdomen: Soft, nontender, and nondistended. Bowel sounds are present. Skin: No lesions in the area of chief complaint Neurologic: Awake, alert, and oriented. Sensory function is grossly intact. Motor  strength is felt to be 5 over 5 bilaterally. No clonus or tremor. Good motor coordination. Lymphatic: No axillary or cervical lymphadenopathy  MUSCULOSKELETAL:  Patient is comfortable in bed. LLE is shortened and in external rotation. Pain with log roll. She has intact flexion and extension of toes. 2+ DP pulses palpable  Assessment: Left hip displaced intertrochanteric fracture with subtrochanteric extension.  Plan: I had a detailed discussion with the patient and her sister. Based on the nature of the fracture ORIF with nailing is recommended. The surgery is intended to help with weight bearing on LLE as well as to help with bed to chair transfers. Patient is currently a resident at an assisted living facility. I described the procedure with the postoperative recovery process. She will be scheduled for surgery Jan 08, 2018  Enzo Bi,  M.D.

## 2017-12-18 NOTE — H&P (Signed)
[facility] [place] NAME: Lisa Figueroa    MR#:  [id number]  DATE OF BIRTH:  02/28/1932  DATE OF ADMISSION:  12/02/2017  PRIMARY CARE PHYSICIAN: [facility]   REQUESTING/REFERRING PHYSICIAN:   CHIEF COMPLAINT:   Chief Complaint  Patient presents with  . Fall  . Hip Pain    left    HISTORY OF PRESENT ILLNESS: Lisa Figueroa  is a 83 y.o. female with a known history of chronic lymphocytic leukemia, hypertension, hyperlipidemia, postherpetic neuralgia, thyroid cancer he is a resident of assisted living facility.  Patient lost balance when she was trying to get to the wheelchair after using the bathroom.  She fell and landed on her left hip and left thigh.  Patient has pain in the left hip and left thigh area which is aching in nature 6 out of 10 on a scale of 1-10.  No history of any head injury.  Patient was evaluated in the emergency room her x-rays of the left lower extremity showed femur fracture.  Chest x-ray showed no rib fractures.  No complaints of any chest pain, shortness of breath.  Hospitalist service was consulted for further care.  Case was discussed with orthopedic surgeon by ER physician.  PAST MEDICAL HISTORY:   Past Medical History:  Diagnosis Date  . CLL (chronic lymphocytic leukemia) ([place])   . History of colon polyps   . History of shingles   . Hyperlipidemia   . Hypertension   . Post herpetic neuralgia   . Shingles   . Thyroid cancer ([place]) 1998  . Thyroid cancer ([place])     PAST SURGICAL HISTORY:  Past Surgical History:  Procedure Laterality Date  . ABDOMINAL HYSTERECTOMY    . APPENDECTOMY    . BREAST SURGERY Right    cyst excision  . CARDIAC CATHETERIZATION  05/18/2008   At [facility]: mild 30% stenosis in LCX. normal EF.    Marland Kitchen CATARACT EXTRACTION W/PHACO Left 06/04/2015   Procedure: CATARACT EXTRACTION PHACO AND INTRAOCULAR LENS PLACEMENT (IOC);  Surgeon: Birder Robson, MD;  Location: [facility]  ORS;  Service: Ophthalmology;  Laterality: Left;  [place] 00:34.1 AP% 23.0 CDE 7.85  . CHOLECYSTECTOMY    . HEMORRHOID SURGERY    . TOTAL THYROIDECTOMY  1998    SOCIAL HISTORY:  Social History   Tobacco Use  . Smoking status: Former Smoker    Packs/day: 1.00    Years: 20.00    Pack years: 20.00    Last attempt to quit: 08/08/1984    Years since quitting: 33.3  . Smokeless tobacco: Never Used  Substance Use Topics  . Alcohol use: Yes    Alcohol/week: 0.0 oz    Comment: wine glass daily    FAMILY HISTORY:  Family History  Problem Relation Age of Onset  . Cancer Brother        prostate and bone  . Heart disease Mother   . Heart disease Father   . Cancer Maternal Aunt        breast  . Cancer Cousin        breast - maternal side    DRUG ALLERGIES:  Allergies  Allergen Reactions  . Penicillin G Swelling    .Has patient had a PCN reaction causing immediate rash, facial/tongue/throat swelling, SOB or lightheadedness with hypotension: Yes Has patient had a PCN reaction causing severe rash involving mucus membranes or skin necrosis: Unknown Has patient had a PCN reaction that required hospitalization: Unknown  Has patient had a PCN reaction occurring within the last 10 years: Unknown If all of the above answers are "NO", then may proceed with Cephalosporin use.   . Prednisone Other (See Comments)    crys and can't sleep crys and can't sleep    REVIEW OF SYSTEMS:   CONSTITUTIONAL: No fever, fatigue or weakness.  EYES: No blurred or double vision.  EARS, NOSE, AND THROAT: No tinnitus or ear pain.  RESPIRATORY: No cough, shortness of breath, wheezing or hemoptysis.  CARDIOVASCULAR: No chest pain, orthopnea, edema.  GASTROINTESTINAL: No nausea, vomiting, diarrhea or abdominal pain.  GENITOURINARY: No dysuria, hematuria.  ENDOCRINE: No polyuria, nocturia,  HEMATOLOGY: No anemia, easy bruising or bleeding SKIN: No rash or lesion. MUSCULOSKELETAL: Left hip and thigh pain Low  back pain NEUROLOGIC: No tingling, numbness, weakness.  PSYCHIATRY: No anxiety or depression.   MEDICATIONS AT HOME:  Prior to Admission medications   Medication Sig Start Date End Date Taking? Authorizing Provider  acetaminophen (TYLENOL) 500 MG tablet Take 500 mg by mouth daily.    [provider]  Cholecalciferol (VITAMIN D) 2000 units CAPS Take by mouth daily.    [provider]  feeding supplement, ENSURE ENLIVE, (ENSURE ENLIVE) LIQD Take 237 mLs by mouth 2 (two) times daily between meals. 10/07/16   Hower, Aaron Mose, MD  gabapentin (NEURONTIN) 400 MG capsule Take 1 capsule (400 mg total) by mouth 2 (two) times daily. 08/16/17   Crecencio Mc, MD  INCRUSE ELLIPTA 62.5 MCG/INH AEPB INHALE 1 PUFF INTO THE LUNGS ONCE A DAY 07/05/17   Laverle Hobby, MD  levothyroxine (SYNTHROID, LEVOTHROID) 150 MCG tablet TAKE 1 TABLET BY MOUTH EVERY DAY 12/13/17   Crecencio Mc, MD  losartan (COZAAR) 100 MG tablet Take 1 tablet (100 mg total) by mouth daily. FOR HYPERTENSION 08/16/17   Crecencio Mc, MD  OXYGEN Inhale 2 L into the lungs. 2 liters @ night    [provider]  sulfamethoxazole-trimethoprim (BACTRIM DS,SEPTRA DS) 800-160 MG tablet Take 1 tablet by mouth 2 (two) times daily. 10/22/17   Crecencio Mc, MD  traMADol (ULTRAM) 50 MG tablet Take 1 tablet (50 mg total) by mouth every 6 (six) hours as needed. 11/17/17   Crecencio Mc, MD      PHYSICAL EXAMINATION:   VITAL SIGNS: Blood pressure (!) 103/55, pulse 66, temperature 97.7 F (36.5 C), temperature source Oral, resp. rate 14, weight 70.3 kg (155 lb), SpO2 93 %.  GENERAL:  83 y.o.-year-old patient lying in the bed with no acute distress.  EYES: Pupils equal, round, reactive to light and accommodation. No scleral icterus. Extraocular muscles intact.  HEENT: Head atraumatic, normocephalic. Oropharynx and nasopharynx clear.  NECK:  Supple, no jugular venous distention. No thyroid enlargement, no tenderness.   LUNGS: Normal breath sounds bilaterally, no wheezing, rales,rhonchi or crepitation. No use of accessory muscles of respiration.  CARDIOVASCULAR: S1, S2 normal. No murmurs, rubs, or gallops.  ABDOMEN: Soft, nontender, nondistended. Bowel sounds present. No organomegaly or mass.  EXTREMITIES: No pedal edema, cyanosis, or clubbing.  Tenderness left hip and left thigh NEUROLOGIC: Cranial nerves II through XII are intact. Muscle strength 5/5 in all extremities. Sensation intact. Gait not checked.  PSYCHIATRIC: The patient is alert and oriented x 3.  SKIN: No obvious rash, lesion, or ulcer.   LABORATORY PANEL:   CBC Recent Labs  Lab 11/27/2017 0123  WBC 252.4*  HGB 7.0*  HCT 23.3*  PLT 139*  MCV 92.3  MCH 27.9  MCHC 30.2*  RDW 17.4*  LYMPHSABS PENDING  MONOABS PENDING  EOSABS PENDING  BASOSABS PENDING   ------------------------------------------------------------------------------------------------------------------  Chemistries  Recent Labs  Lab 12/22/2017 0124  NA 134*  K 5.4*  CL 86*  CO2 43*  GLUCOSE 105*  BUN 25*  CREATININE 0.82  CALCIUM 10.0  AST 25  ALT 9*  ALKPHOS 67  BILITOT 0.7   ------------------------------------------------------------------------------------------------------------------ estimated creatinine clearance is 46.6 mL/min (by C-G formula based on SCr of 0.82 mg/dL). ------------------------------------------------------------------------------------------------------------------ No results for input(s): TSH, T4TOTAL, T3FREE, THYROIDAB in the last 72 hours.  Invalid input(s): FREET3   Coagulation profile No results for input(s): INR, PROTIME in the last 168 hours. ------------------------------------------------------------------------------------------------------------------- No results for input(s): DDIMER in the last 72  hours. -------------------------------------------------------------------------------------------------------------------  Cardiac Enzymes No results for input(s): CKMB, TROPONINI, MYOGLOBIN in the last 168 hours.  Invalid input(s): CK ------------------------------------------------------------------------------------------------------------------ Invalid input(s): POCBNP  ---------------------------------------------------------------------------------------------------------------  Urinalysis    Component Value Date/Time   COLORURINE YELLOW (A) 11/27/2017 0145   APPEARANCEUR HAZY (A) 12/02/2017 0145   APPEARANCEUR Hazy 07/10/2013 1200   LABSPEC 1.013 12/15/2017 0145   LABSPEC 1.020 07/10/2013 1200   PHURINE 7.0 12/24/2017 0145   GLUCOSEU NEGATIVE 11/30/2017 0145   GLUCOSEU Negative 07/10/2013 1200   HGBUR SMALL (A) 12/22/2017 0145   BILIRUBINUR NEGATIVE 12/27/2017 0145   BILIRUBINUR negative 02/15/2017 1314   BILIRUBINUR Negative 07/10/2013 1200   KETONESUR NEGATIVE 12/06/2017 0145   PROTEINUR 30 (A) 11/30/2017 0145   UROBILINOGEN 0.2 02/15/2017 1314   NITRITE NEGATIVE 12/06/2017 0145   LEUKOCYTESUR NEGATIVE 12/17/2017 0145   LEUKOCYTESUR Negative 07/10/2013 1200     RADIOLOGY: Dg Chest 1 View  Result Date: 12/16/2017 CLINICAL DATA:  Status post unwitnessed fall, with concern for chest injury. Initial encounter. EXAM: CHEST 1 VIEW COMPARISON:  Chest radiograph and CTA of the chest performed 08/10/2017 FINDINGS: The lungs are well-aerated. Mild right midlung and right basilar airspace opacities may reflect atelectasis or possibly mild infection. Vascular congestion may be transient in nature. There is no evidence of pleural effusion or pneumothorax. The cardiomediastinal silhouette is mildly enlarged. No acute osseous abnormalities are seen. IMPRESSION: 1. No displaced rib fracture seen. 2. Mild right midlung and right basilar airspace opacities may reflect atelectasis or  possibly mild infection. 3. Vascular congestion may be transient in nature. Mild cardiomegaly. Electronically Signed   By: Garald Balding M.D.   On: 12/25/2017 01:46   Dg Lumbar Spine 2-3 Views  Result Date: 12/06/2017 CLINICAL DATA:  83 year old female with fall and left femoral fracture. EXAM: LUMBAR SPINE - 2-3 VIEW COMPARISON:  None. FINDINGS: There is no acute fracture or subluxation of the lumbar spine. The vertebral body heights are maintained. The bones are osteopenic. There is advanced atherosclerotic calcification of the visualized aorta. IMPRESSION: 1. No acute fracture or subluxation of the lumbar spine. 2. Atherosclerotic calcification of the aorta. Electronically Signed   By: Anner Crete M.D.   On: 12/26/2017 01:45   Dg Hip Unilat With Pelvis 2-3 Views Left  Result Date: 11/29/2017 CLINICAL DATA:  83 year old female with fall and left hip pain. EXAM: DG HIP (WITH OR WITHOUT PELVIS) 2-3V LEFT COMPARISON:  None. FINDINGS: There is a comminuted fracture of the proximal left femur with intratrochanteric and sub trochanteric fractures. There is medial angulation of the femoral shaft in relation to the neck of the femur. There is displaced fracture fragment of the lesser trochanter. The left femoral head remains in anatomic alignment with the acetabulum. No other acute fracture. Probable  old fracture deformity of the left pubic bone. The bones are osteopenic. The soft tissues appear unremarkable. IMPRESSION: Comminuted, displaced, and angulated fracture of the proximal left femur with intertrochanteric and sub trochanteric involvement. No dislocation. Electronically Signed   By: Anner Crete M.D.   On: 12/22/2017 01:43    EKG: Orders placed or performed during the hospital encounter of 12/03/2017  . ED EKG  . ED EKG    IMPRESSION AND PLAN: 83 year old female patient with history of chronic lymphocytic leukemia, hypertension, hyperlipidemia, thyroid cancer, postherpetic neuralgia  presented to the emergency room with fall and left hip pain.  Admitting diagnosis 1.  Left femur fracture 2.  Accidental fall 3.  Hyperkalemia 4.  Dehydration 5.  Left hip pain 6.  Chronic lymphocytic leukemia Treatment plan Admit patient to medical floor inpatient service Orthopedic surgery consultation IV fluids Pain management with oral tramadol and as needed IV morphine Follow potassium level Monitor hemoglobin and WBC counts DVT prophylaxis sequential compression device to lower extremities  All the records are reviewed and case discussed with ED provider. Management plans discussed with the patient, family and they are in agreement.  CODE STATUS:FULL CODE Code Status History    Date Active Date Inactive Code Status Order ID Comments User Context   08/10/2017 14:40 08/11/2017 17:24 Full Code [id number]  Theodoro Grist, MD Inpatient   10/04/2016 23:01 10/07/2016 16:16 Full Code [id number]  Lance Coon, MD Inpatient    Advance Directive Documentation     Most Recent Value  Type of Advance Directive  Healthcare Power of Attorney, Living will  Pre-existing out of facility DNR order (yellow form or pink MOST form)  No data  "MOST" Form in Place?  No data       TOTAL TIME TAKING CARE OF THIS PATIENT: 50 minutes.    Saundra Shelling M.D on 12/09/2017 at 3:04 AM  Between 7am to 6pm - Pager - [phone]  After 6pm go to [email or web address] - password [email or web address] [facility]  [facility]  Office  [phone]  CC: Primary care physician; [facility]

## 2017-12-18 NOTE — ED Notes (Signed)
Pt transport to 144

## 2017-12-18 NOTE — Progress Notes (Signed)
Pt admitted to the Orthopedic Unit 12/22 after falling out of her wheelchair CXR revealing left lower extremity femur fracture.  Orthopedics consulted recommendation for ORIF with nailing is recommended plans for surgery 12/26/2017.  She was subsequently transferred to ICU 12/22 with hyperkalemia and anemia she does have a hx of CLL Oncology and Nephrology consulted.  Pt currently alert and oriented, following commands, lungs clear, nsr on cardiac monitor.   While in the [facility] PCCM team will assist with medical management.  Will transfuse pRBC's for hgb <7 or active s/sx of bleeding   Marda Stalker, [place] Pager [phone] (please enter 7 digits) PCCM Consult Pager [phone] (please enter 7 digits)

## 2017-12-18 NOTE — Progress Notes (Signed)
pts repeat K+ 6.8. Will notifiy md.

## 2017-12-18 NOTE — ED Notes (Signed)
This RN attempted to call report to floor. Primary RN unavailable at this time.

## 2017-12-18 NOTE — Progress Notes (Signed)
Pt transfered to ICU bed 6, sister Peggy notified.

## 2017-12-18 NOTE — Progress Notes (Addendum)
[place] at [facility]                                                                                                                                                                                  Patient Demographics   Lisa Figueroa, is a 84 y.o. female, DOB - 09-16-1932, ENI:[id number]  Admit date - 11/28/2017   Admitting Physician Saundra Shelling, MD  Outpatient Primary MD for the patient is [facility]   LOS - 0  Subjective: Called by nurse due to concern for patient's potassium increasing as well as her anemia Patient's sisters are at the bedside they are the ones that are answering most of the questions.  Denies any shortness of breath or chest pain Complains of significant pain in the left femur    Review of Systems:   CONSTITUTIONAL: No documented fever. No fatigue, weakness. No weight gain, no weight loss.  EYES: No blurry or double vision.  ENT: No tinnitus. No postnasal drip. No redness of the oropharynx.  RESPIRATORY: No cough, no wheeze, no hemoptysis. No dyspnea.  CARDIOVASCULAR: No chest pain. No orthopnea. No palpitations. No syncope.  GASTROINTESTINAL: No nausea, no vomiting or diarrhea. No abdominal pain. No melena or hematochezia.  GENITOURINARY: No dysuria or hematuria.  ENDOCRINE: No polyuria or nocturia. No heat or cold intolerance.  HEMATOLOGY: No anemia. No bruising. No bleeding.  INTEGUMENTARY: No rashes. No lesions.  MUSCULOSKELETAL: Positive pain in the left leg NEUROLOGIC: No numbness, tingling, or ataxia. No seizure-type activity.  PSYCHIATRIC: No anxiety. No insomnia. No ADD.    Vitals:   Vitals:   12/11/2017 0245 12/26/2017 0315 12/15/2017 0346 12/15/2017 0741  BP:   (!) 99/57 (!) 96/48  Pulse: 71 69 69 69  Resp: 17 14 16 16   Temp:   97.7 F (36.5 C) 98.8 F (37.1 C)  TempSrc:   Oral   SpO2: 96% 91% 93% 91%  Weight:   140 lb (63.5 kg)   Height:   5\' 2"  (1.575 m)     Wt Readings from Last 3  Encounters:  11/29/2017 140 lb (63.5 kg)  09/09/17 157 lb 6.4 oz (71.4 kg)  09/01/17 158 lb (71.7 kg)     Intake/Output Summary (Last 24 hours) at 11/30/2017 6144 Last data filed at 12/11/2017 0600 Gross per 24 hour  Intake 151.25 ml  Output -  Net 151.25 ml    Physical Exam:   GENERAL: Pleasant-appearing uncomfortable due to pain HEAD, EYES, EARS, NOSE AND THROAT: Atraumatic, normocephalic. Extraocular muscles are intact. Pupils equal and reactive to light. Sclerae anicteric. No conjunctival injection. No oro-pharyngeal erythema.  NECK: Supple. There is no jugular venous  distention. No bruits, no lymphadenopathy, no thyromegaly.  HEART: Regular rate and rhythm,. No murmurs, no rubs, no clicks.  LUNGS: Clear to auscultation bilaterally. No rales or rhonchi. No wheezes.  ABDOMEN: Soft, flat, nontender, nondistended. Has good bowel sounds. No hepatosplenomegaly appreciated.  EXTREMITIES: 1+ edema NEUROLOGIC: The patient is alert, awake, oriented to person and place not time SKIN: Moist and warm with no rashes appreciated.  Psych: Not anxious, depressed LN: No inguinal LN enlargement    Antibiotics   Anti-infectives (From admission, onward)   None      Medications   Scheduled Meds: . feeding supplement (ENSURE ENLIVE)  237 mL Oral BID BM  . gabapentin  400 mg Oral BID  . levothyroxine  150 mcg Oral QAC breakfast  . umeclidinium bromide  1 puff Inhalation Daily   Continuous Infusions: . sodium chloride    . dextrose 5 % and 0.9% NaCl 75 mL/hr at 12/26/2017 0551   PRN Meds:.acetaminophen **OR** acetaminophen, morphine injection, ondansetron **OR** ondansetron (ZOFRAN) IV, senna-docusate, traMADol   Data Review:   Micro Results Recent Results (from the past 240 hour(s))  Surgical PCR screen     Status: Abnormal   Collection Time: 12/08/2017  3:57 AM  Result Value Ref Range Status   MRSA, PCR NEGATIVE NEGATIVE Final   Staphylococcus aureus POSITIVE (A) NEGATIVE Final     Comment: (NOTE) The Xpert SA Assay (FDA approved for NASAL specimens in patients 48 years of age and older), is one component of a comprehensive surveillance program. It is not intended to diagnose infection nor to guide or monitor treatment. Performed at [facility], [address] [place], [place] [address]     Radiology Reports Dg Chest 1 View  Result Date: 11/29/2017 CLINICAL DATA:  Status post unwitnessed fall, with concern for chest injury. Initial encounter. EXAM: CHEST 1 VIEW COMPARISON:  Chest radiograph and CTA of the chest performed 08/10/2017 FINDINGS: The lungs are well-aerated. Mild right midlung and right basilar airspace opacities may reflect atelectasis or possibly mild infection. Vascular congestion may be transient in nature. There is no evidence of pleural effusion or pneumothorax. The cardiomediastinal silhouette is mildly enlarged. No acute osseous abnormalities are seen. IMPRESSION: 1. No displaced rib fracture seen. 2. Mild right midlung and right basilar airspace opacities may reflect atelectasis or possibly mild infection. 3. Vascular congestion may be transient in nature. Mild cardiomegaly. Electronically Signed   By: Garald Balding M.D.   On: 12/05/2017 01:46   Dg Lumbar Spine 2-3 Views  Result Date: 12/06/2017 CLINICAL DATA:  83 year old female with fall and left femoral fracture. EXAM: LUMBAR SPINE - 2-3 VIEW COMPARISON:  None. FINDINGS: There is no acute fracture or subluxation of the lumbar spine. The vertebral body heights are maintained. The bones are osteopenic. There is advanced atherosclerotic calcification of the visualized aorta. IMPRESSION: 1. No acute fracture or subluxation of the lumbar spine. 2. Atherosclerotic calcification of the aorta. Electronically Signed   By: Anner Crete M.D.   On: 12/13/2017 01:45   Dg Hip Unilat With Pelvis 2-3 Views Left  Result Date: 12/20/2017 CLINICAL DATA:  83 year old female with fall and left hip  pain. EXAM: DG HIP (WITH OR WITHOUT PELVIS) 2-3V LEFT COMPARISON:  None. FINDINGS: There is a comminuted fracture of the proximal left femur with intratrochanteric and sub trochanteric fractures. There is medial angulation of the femoral shaft in relation to the neck of the femur. There is displaced fracture fragment of the lesser trochanter. The left femoral  head remains in anatomic alignment with the acetabulum. No other acute fracture. Probable old fracture deformity of the left pubic bone. The bones are osteopenic. The soft tissues appear unremarkable. IMPRESSION: Comminuted, displaced, and angulated fracture of the proximal left femur with intertrochanteric and sub trochanteric involvement. No dislocation. Electronically Signed   By: Anner Crete M.D.   On: 12/13/2017 01:43     CBC Recent Labs  Lab 12/10/2017 0123 12/13/2017 0406  WBC 252.4* 227.4*  HGB 7.0* 6.2*  HCT 23.3* 20.7*  PLT 139* 116*  MCV 92.3 94.9  MCH 27.9 28.2  MCHC 30.2* 29.7*  RDW 17.4* 17.8*  LYMPHSABS 234.7*  --   MONOABS 7.6*  --   EOSABS 0.0  --   BASOSABS 0.0  --     Chemistries  Recent Labs  Lab 12/04/2017 0124 12/17/2017 0406  NA 134* 132*  K 5.4* 6.9*  CL 86* 87*  CO2 43* 43*  GLUCOSE 105* 94  BUN 25* 23*  CREATININE 0.82 0.82  CALCIUM 10.0 9.3  AST 25  --   ALT 9*  --   ALKPHOS 67  --   BILITOT 0.7  --    ------------------------------------------------------------------------------------------------------------------ estimated creatinine clearance is 45.5 mL/min (by C-G formula based on SCr of 0.82 mg/dL). ------------------------------------------------------------------------------------------------------------------ No results for input(s): HGBA1C in the last 72 hours. ------------------------------------------------------------------------------------------------------------------ No results for input(s): CHOL, HDL, LDLCALC, TRIG, CHOLHDL, LDLDIRECT in the last 72  hours. ------------------------------------------------------------------------------------------------------------------ No results for input(s): TSH, T4TOTAL, T3FREE, THYROIDAB in the last 72 hours.  Invalid input(s): FREET3 ------------------------------------------------------------------------------------------------------------------ No results for input(s): VITAMINB12, FOLATE, FERRITIN, TIBC, IRON, RETICCTPCT in the last 72 hours.  Coagulation profile Recent Labs  Lab 11/30/2017 0406  INR 1.10    No results for input(s): DDIMER in the last 72 hours.  Cardiac Enzymes No results for input(s): CKMB, TROPONINI, MYOGLOBIN in the last 168 hours.  Invalid input(s): CK ------------------------------------------------------------------------------------------------------------------ Invalid input(s): [place]  Patient is a 83 year old white female presenting with fall   1. severe Hyperkalemia suspect due to  Bactrim therapy  Patient did not respond to Kayexalate and other therapies I have to move patient to the ICU I will give her again Kayexalate amp of D50 and insulin, calcium gluconate I have discussed the case with ICU physician as well as the nephrologist  2.  Left femur fracture: Patient has multiple medical problems, her hemoglobin needs to be better She is at moderate to high risk of surgery no further cardiopulmonary workup needs to be done Hypokalemia I feel this is likely hemolysis I will be repeating another potassium level stat    3.    Anemia this is related to her CLL: Patient should at least have a hemoglobin greater than 8 prior to surgery due to further loss during surgery we will recheck her hemoglobin later today make sure he is improving  4.  Dehydration continue IV fluids  5.  Left hip pain  6.  Chronic lymphocytic leukemia severely elevated WBC count Continue to monitor        Code Status Orders  (From admission, onward)         Start     Ordered   12/02/2017 0343  Full code  Continuous     11/27/2017 0342    Code Status History    Date Active Date Inactive Code Status Order ID Comments User Context   08/10/2017 14:40 08/11/2017 17:24 Full Code [id number]  Theodoro Grist, MD Inpatient   10/04/2016  23:01 10/07/2016 16:16 Full Code [id number]  Lance Coon, MD Inpatient    Advance Directive Documentation     Most Recent Value  Type of Advance Directive  Healthcare Power of Attorney, Living will  Pre-existing out of facility DNR order (yellow form or pink MOST form)  No data  "MOST" Form in Place?  No data           Consults  orthopedics   DVT Prophylaxis SCDs for now recommend Lovenox postop  Lab Results  Component Value Date   PLT 116 (L) 11/29/2017     Time Spent in minutes   41min equal care time spent patient had to be transferred to the ICU due to persistent hyperkalemia  Greater than 50% of time spent in care coordination and counseling patient regarding the condition and plan of care.   Dustin Flock M.D on 12/13/2017 at 9:04 AM  Between 7am to 6pm - Pager - [phone]  After 6pm go to [email or web address] - password [email or web address] [facility]   Office  [phone]

## 2017-12-18 NOTE — Progress Notes (Signed)
While rounding, [place] was asked to visit with family of IC-[id number]. Pt has transitioned to comfort care. Daughter and Niece are bedside. Both are at peace with the situation and have spent time with their pastor earlier in the evening. [facility] is available for follow up if needed or desired.    12/24/2017 2000  Clinical Encounter Type  Visited With Family;Patient not available  Visit Type Follow-up;Spiritual support;Critical Care;Patient actively dying  Referral From Nurse

## 2017-12-18 NOTE — ED Triage Notes (Signed)
Per EMS. Patient coming from [place]. Patient had unwitnessed fall. Patient denies head injury, LOC. Patient c/o left hip pain, left leg pain, lower back pain.

## 2017-12-18 NOTE — Progress Notes (Signed)
[place] responded to an OR for prayer. Pt was asleep on my arrival. RN stated the Pt is not very oriented and was surprised at the OR. [facility] is available for follow up if needed.     12/17/2017 1300  Clinical Encounter Type  Visited With Patient  Visit Type Initial;Spiritual support  Referral From Nurse  Consult/Referral To Chaplain

## 2017-12-18 NOTE — ED Provider Notes (Addendum)
[facility] Emergency Department Provider Note   ____________________________________________   First MD Initiated Contact with Patient 11/27/2017 0045     (approximate)  I have reviewed the triage vital signs and the nursing notes.   HISTORY  Chief Complaint Fall and Hip Pain (left)  History limited by dementia  HPI Lisa Figueroa is a 83 y.o. female brought to the ED from [facility] via EMS with a chief complaint of unwitnessed fall.  Patient is wheelchair-bound but stands for transfers and ambulates a few steps.  She was found on the floor complaining of left hip and lower back pain.  Patient herself denies striking head or LOC.  Denies vision changes, neck pain, chest pain, shortness of breath, abdominal pain, nausea, vomiting.   Past Medical History:  Diagnosis Date  . CLL (chronic lymphocytic leukemia) ([place])   . History of colon polyps   . History of shingles   . Hyperlipidemia   . Hypertension   . Post herpetic neuralgia   . Shingles   . Thyroid cancer ([place]) 1998  . Thyroid cancer [facility])     Patient Active Problem List   Diagnosis Date Noted  . Femur fracture ([place]) 12/26/2017  . Cognitive deficits 09/11/2017  . Bilateral leg weakness 08/10/2017  . Generalized weakness 08/10/2017  . Rib fracture 02/15/2017  . Chronic respiratory failure with hypoxia, on home O2 therapy ([place]) 10/16/2016  . Hospital discharge follow-up 10/16/2016  . Allergic rhinitis 07/03/2016  . CLL (chronic lymphocytic leukemia) ([place]) 02/02/2016  . History of bad fall 06/20/2015  . Medicare annual wellness visit, subsequent 05/22/2015  . Essential hypertension 11/19/2014  . Hyperlipidemia 11/19/2014  . Osteoporosis 11/18/2014  . S/P thyroidectomy 11/18/2014  . Postsurgical hypothyroidism 11/18/2014  . Post-herpetic trigeminal neuralgia 11/15/2014  . Need for prophylactic vaccination against Streptococcus pneumoniae (pneumococcus) 08/08/2014  . Abnormal mammogram  05/18/2013    Past Surgical History:  Procedure Laterality Date  . ABDOMINAL HYSTERECTOMY    . APPENDECTOMY    . BREAST SURGERY Right    cyst excision  . CARDIAC CATHETERIZATION  05/18/2008   At [facility]: mild 30% stenosis in LCX. normal EF.    Marland Kitchen CATARACT EXTRACTION W/PHACO Left 06/04/2015   Procedure: CATARACT EXTRACTION PHACO AND INTRAOCULAR LENS PLACEMENT (IOC);  Surgeon: Birder Robson, MD;  Location: [facility] ORS;  Service: Ophthalmology;  Laterality: Left;  [place] 00:34.1 AP% 23.0 CDE 7.85  . CHOLECYSTECTOMY    . HEMORRHOID SURGERY    . TOTAL THYROIDECTOMY  1998    Prior to Admission medications   Medication Sig Start Date End Date Taking? Authorizing Provider  acetaminophen (TYLENOL) 500 MG tablet Take 500 mg by mouth daily.    [provider]  Cholecalciferol (VITAMIN D) 2000 units CAPS Take by mouth daily.    [provider]  feeding supplement, ENSURE ENLIVE, (ENSURE ENLIVE) LIQD Take 237 mLs by mouth 2 (two) times daily between meals. 10/07/16   Hower, Aaron Mose, MD  gabapentin (NEURONTIN) 400 MG capsule Take 1 capsule (400 mg total) by mouth 2 (two) times daily. 08/16/17   Crecencio Mc, MD  INCRUSE ELLIPTA 62.5 MCG/INH AEPB INHALE 1 PUFF INTO THE LUNGS ONCE A DAY 07/05/17   Laverle Hobby, MD  levothyroxine (SYNTHROID, LEVOTHROID) 150 MCG tablet TAKE 1 TABLET BY MOUTH EVERY DAY 12/13/17   Crecencio Mc, MD  losartan (COZAAR) 100 MG tablet Take 1 tablet (100 mg total) by mouth daily. FOR HYPERTENSION 08/16/17   Crecencio Mc, MD  OXYGEN Inhale 2 L into the lungs. 2 liters @ night    [provider]  sulfamethoxazole-trimethoprim (BACTRIM DS,SEPTRA DS) 800-160 MG tablet Take 1 tablet by mouth 2 (two) times daily. 10/22/17   Crecencio Mc, MD  traMADol (ULTRAM) 50 MG tablet Take 1 tablet (50 mg total) by mouth every 6 (six) hours as needed. 11/17/17   Crecencio Mc, MD    Allergies Penicillin g and Prednisone  Family History  Problem Relation  Age of Onset  . Cancer Brother        prostate and bone  . Heart disease Mother   . Heart disease Father   . Cancer Maternal Aunt        breast  . Cancer Cousin        breast - maternal side    Social History Social History   Tobacco Use  . Smoking status: Former Smoker    Packs/day: 1.00    Years: 20.00    Pack years: 20.00    Last attempt to quit: 08/08/1984    Years since quitting: 33.3  . Smokeless tobacco: Never Used  Substance Use Topics  . Alcohol use: Yes    Alcohol/week: 0.0 oz    Comment: wine glass daily  . Drug use: No    Review of Systems  Constitutional: No fever/chills. Eyes: No visual changes. ENT: No sore throat. Cardiovascular: Denies chest pain. Respiratory: Denies shortness of breath. Gastrointestinal: No abdominal pain.  No nausea, no vomiting.  No diarrhea.  No constipation. Genitourinary: Negative for dysuria. Musculoskeletal: Positive for left hip and lower back pain. Skin: Negative for rash. Neurological: Negative for headaches, focal weakness or numbness.   ____________________________________________   PHYSICAL EXAM:  VITAL SIGNS: ED Triage Vitals  Enc Vitals Group     BP 12/16/2017 0030 133/78     Pulse Rate 11/27/2017 0030 70     Resp 12/26/2017 0030 18     Temp 12/14/2017 0030 97.7 F (36.5 C)     Temp Source 11/28/2017 0030 Oral     SpO2 11/28/2017 0030 97 %     Weight 12/24/2017 0027 155 lb (70.3 kg)     Height --      Head Circumference --      Peak Flow --      Pain Score 11/27/2017 0027 10     Pain Loc --      Pain Edu? --      Excl. in [place]? --     Constitutional: Alert and oriented. Well appearing and in mild acute distress. Eyes: Conjunctivae are normal. PERRL. EOMI. Head: Atraumatic. Nose: No congestion/rhinnorhea. Mouth/Throat: Mucous membranes are moist.  Oropharynx non-erythematous. Neck: No stridor.  No cervical spine tenderness to palpation. Cardiovascular: Normal rate, regular rhythm. Grossly normal heart sounds.   Good peripheral circulation. Respiratory: Normal respiratory effort.  No retractions. Lungs CTAB. Gastrointestinal: Soft and nontender. No distention. No abdominal bruits. No CVA tenderness. Musculoskeletal:  Left lateral femur with mild to moderate swelling.  2+ femoral and distal pulses.  Limited range of motion secondary to pain.  Brisk, less than 5-second capillary refill. Neurologic: Alert and oriented to name.  Normal speech and language. No gross focal neurologic deficits are appreciated.  Skin:  Skin is warm, dry and intact. No rash noted. Psychiatric: Mood and affect are normal. Speech and behavior are normal.  ____________________________________________   LABS (all labs ordered are listed, but only abnormal results are displayed)  Labs Reviewed  CBC WITH DIFFERENTIAL/PLATELET -  Abnormal; Notable for the following components:      Result Value   WBC 252.4 (*)    RBC 2.53 (*)    Hemoglobin 7.0 (*)    HCT 23.3 (*)    MCHC 30.2 (*)    RDW 17.4 (*)    Platelets 139 (*)    nRBC 1 (*)    Neutro Abs 10.1 (*)    Lymphs Abs 234.7 (*)    Monocytes Absolute 7.6 (*)    All other components within normal limits  COMPREHENSIVE METABOLIC PANEL - Abnormal; Notable for the following components:   Sodium 134 (*)    Potassium 5.4 (*)    Chloride 86 (*)    CO2 43 (*)    Glucose, Bld 105 (*)    BUN 25 (*)    ALT 9 (*)    All other components within normal limits  URINALYSIS, COMPLETE (UACMP) WITH MICROSCOPIC - Abnormal; Notable for the following components:   Color, Urine YELLOW (*)    APPearance HAZY (*)    Hgb urine dipstick SMALL (*)    Protein, ur 30 (*)    All other components within normal limits  BASIC METABOLIC PANEL - Abnormal; Notable for the following components:   Sodium 132 (*)    Potassium 6.9 (*)    Chloride 87 (*)    CO2 43 (*)    BUN 23 (*)    Anion gap 2 (*)    All other components within normal limits  CBC - Abnormal; Notable for the following components:     WBC 227.4 (*)    RBC 2.18 (*)    Hemoglobin 6.2 (*)    HCT 20.7 (*)    MCHC 29.7 (*)    RDW 17.8 (*)    Platelets 116 (*)    All other components within normal limits  URINE CULTURE  SURGICAL PCR SCREEN  PROTIME-INR  BASIC METABOLIC PANEL  TYPE AND SCREEN   ____________________________________________  EKG  ED ECG REPORT I, SUNG,JADE J, the attending physician, personally viewed and interpreted this ECG.   Date: 12/06/2017  EKG Time: 0036  Rate: 69  Rhythm: normal EKG, normal sinus rhythm  Axis: Normal  Intervals:none  ST&T Change: Nonspecific  ____________________________________________  RADIOLOGY  Dg Chest 1 View  Result Date: 12/11/2017 CLINICAL DATA:  Status post unwitnessed fall, with concern for chest injury. Initial encounter. EXAM: CHEST 1 VIEW COMPARISON:  Chest radiograph and CTA of the chest performed 08/10/2017 FINDINGS: The lungs are well-aerated. Mild right midlung and right basilar airspace opacities may reflect atelectasis or possibly mild infection. Vascular congestion may be transient in nature. There is no evidence of pleural effusion or pneumothorax. The cardiomediastinal silhouette is mildly enlarged. No acute osseous abnormalities are seen. IMPRESSION: 1. No displaced rib fracture seen. 2. Mild right midlung and right basilar airspace opacities may reflect atelectasis or possibly mild infection. 3. Vascular congestion may be transient in nature. Mild cardiomegaly. Electronically Signed   By: Garald Balding M.D.   On: 12/07/2017 01:46   Dg Lumbar Spine 2-3 Views  Result Date: 12/20/2017 CLINICAL DATA:  83 year old female with fall and left femoral fracture. EXAM: LUMBAR SPINE - 2-3 VIEW COMPARISON:  None. FINDINGS: There is no acute fracture or subluxation of the lumbar spine. The vertebral body heights are maintained. The bones are osteopenic. There is advanced atherosclerotic calcification of the visualized aorta. IMPRESSION: 1. No acute fracture or  subluxation of the lumbar spine. 2. Atherosclerotic calcification of the aorta.  Electronically Signed   By: Anner Crete M.D.   On: 12/17/2017 01:45   Dg Hip Unilat With Pelvis 2-3 Views Left  Result Date: 12/16/2017 CLINICAL DATA:  83 year old female with fall and left hip pain. EXAM: DG HIP (WITH OR WITHOUT PELVIS) 2-3V LEFT COMPARISON:  None. FINDINGS: There is a comminuted fracture of the proximal left femur with intratrochanteric and sub trochanteric fractures. There is medial angulation of the femoral shaft in relation to the neck of the femur. There is displaced fracture fragment of the lesser trochanter. The left femoral head remains in anatomic alignment with the acetabulum. No other acute fracture. Probable old fracture deformity of the left pubic bone. The bones are osteopenic. The soft tissues appear unremarkable. IMPRESSION: Comminuted, displaced, and angulated fracture of the proximal left femur with intertrochanteric and sub trochanteric involvement. No dislocation. Electronically Signed   By: Anner Crete M.D.   On: 11/27/2017 01:43    ____________________________________________   PROCEDURES  Procedure(s) performed: None  Procedures  Critical Care performed: No  ____________________________________________   INITIAL IMPRESSION / ASSESSMENT AND PLAN / ED COURSE  As part of my medical decision making, I reviewed the following data within the electronic MEDICAL RECORD NUMBER History obtained from family, Nursing notes reviewed and incorporated, Labs reviewed, EKG interpreted, Old chart reviewed, Radiograph reviewed, Discussed with admitting physician Dr. Estanislado Pandy, A consult was requested and obtained from this/these consultant(s) Orthopedics (Dr. Donivan Scull)  and Notes from prior ED visits.   83 year old female status post fall at nursing facility with comminuted left femur fracture.  Have discussed with orthopedics and hospitalist who will evaluate patient in the emergency  department for admission.  Clinical Course as of Dec 18 613  Sat Dec 18, 2017  0612 CBC was pending at time of admission.  I have noted results remarkable for WBC 252 which is much higher than 3 months ago, and H/H 7/23.  Spoke with hospitalist Dr. Estanislado Pandy who is aware.  [JS]    Clinical Course User Index [JS] Paulette Blanch, MD     ____________________________________________   FINAL CLINICAL IMPRESSION(S) / ED DIAGNOSES  Final diagnoses:  Fall, initial encounter  Left hip pain  Closed fracture of left femur, unspecified fracture morphology, unspecified portion of femur, initial encounter [facility])     ED Discharge Orders    None       Note:  This document was prepared using Dragon voice recognition software and may include unintentional dictation errors.    Paulette Blanch, MD 11/27/2017 2440    Paulette Blanch, MD 12/06/2017 [phone]

## 2017-12-18 NOTE — Progress Notes (Signed)
Pt became hypotensive with systolic bp 17'O and developed new onset atrial fibrillation with rvr I spoke with Dr. Jerelyn Charles hospitalist to discuss plan of care he agreed with PCCM assisting with plan of care.  Discussed change in pts condition with pts sister who is [employer] she confirmed pt is a DO NOT RESUSCITATE/DO NOT INTUBATE.  I discussed placing a central line and vasopressors due to hypotension, however Mrs. Dennard Schaumann stated her sister would not want these interventions.  She states she would like to transition her sister to comfort care measures only. Pts family notified pts daughter Simara Rhyner. Therefore, comfort measure orders placed Dr. Catha Gosselin physician and Dr. Soyla Murphy intensivist notified.    Marda Stalker, [place] Pager [phone] (please enter 7 digits) PCCM Consult Pager [phone] (please enter 7 digits)

## 2017-12-19 ENCOUNTER — Encounter: Admission: EM | Disposition: E | Payer: Self-pay | Source: Home / Self Care | Attending: Internal Medicine

## 2017-12-19 LAB — TYPE AND SCREEN
ABO/RH(D): B POS
Antibody Screen: NEGATIVE
UNIT DIVISION: 0
UNIT DIVISION: 0
Unit division: 0

## 2017-12-19 LAB — BPAM RBC
BLOOD PRODUCT EXPIRATION DATE: 201901012359
Blood Product Expiration Date: 201901132359
Blood Product Expiration Date: 201901202359
ISSUE DATE / TIME: 201812220934
ISSUE DATE / TIME: 201812221841
UNIT TYPE AND RH: 7300
Unit Type and Rh: 1700
Unit Type and Rh: 7300

## 2017-12-19 LAB — URINE CULTURE: Culture: NO GROWTH

## 2017-12-19 SURGERY — FIXATION, FRACTURE, INTERTROCHANTERIC, WITH INTRAMEDULLARY ROD
Anesthesia: Choice | Laterality: Left

## 2017-12-28 NOTE — Progress Notes (Signed)
CH responded to a PG for IC-06. The Pt passed. One of her sisters is bedside. She was soon joined by a second sister. White spent some time listening to the family's story and providing emotional support. The family is in a good space spiritually. CH offered prayer of thanksgiving for the life of the Pt and the love of her family.    01-11-18 0500  Clinical Encounter Type  Visited With Family  Visit Type Follow-up;Death  Referral From Nurse

## 2017-12-28 NOTE — Progress Notes (Signed)
Pt passed away peacefully with family and this RN at bedside. This RN and Donnamae Jude pronounced patient at [phone]. Elink and Deterding MD notified of death. CDS notified.

## 2017-12-28 NOTE — Death Summary Note (Signed)
[place] at [facility] Date of Admission: 12/31/2017 12:23 AM  Date of death:   Admitting diagnosis:  Left-sided femur fracture   Diagnosis at time of death 1.  Hypotension 2.  Atrial fibrillation with rapid ventricular rate 3.  Severe hypokalemia likely due to Bactrim therapy 4.  Left femur fracture 5.  Anemia likely multifactorial including due to CLL 6.  Dehydration 7.  Lymphoproliferative disorder and not CLL according to oncology      Hospital course Patient is a 84 year old female with known history of some sort of lymphoproliferative disorder who presented after a fall when she lost balance.  Patient was admitted she on admission she was noted to have hyperkalemia as well as anemia as well as significant leukocytosis.  Patient's hyperkalemia was treated she was also transfused.  Her last known potassium was 5.5 decreased from 6.9.  She was also transfused 2 units of packed RBCs.  Last check hemoglobin was 6.8.  Patient due to her hyperkalemia was transferred to the ICU.  There she received appropriate treatment for the hyperkalemia.  Her potassium did improve however her blood pressure started dropping as well as developed atrial fibrillation with rapid ventricular rate.  It was discussed with the family regarding a central line and starting patient on pressors however family felt that due to her multiple other medical issues they did not want her to be started on pressors or be aggressive with her care.  Patient was transitioned to just comfort care measures.  She passed away early a.m. of 01-01-2018.  Family was made aware of her death.        TOTAL TIME TAKING CARE OF THIS PATIENT: 35 minutes.    Dustin Flock M.D on 01-Jan-2018 at 8:18 AM  Between 7am to 6pm - Pager - [phone]  After 6pm go to [email or web address] - password [email or web address] [facility]  [facility]  Office  [phone]  CC: Primary care physician; [employer]

## 2017-12-28 DEATH — deceased

## 2018-01-23 IMAGING — CR DG CHEST 1V
1 series · 1 of 1 positions shown · non-contrast
Comparison: Chest radiograph and CTA of the chest performed
08/10/2017

CLINICAL DATA: Status post unwitnessed fall, with concern for chest
injury. Initial encounter.

EXAM:
CHEST 1 VIEW

[chest ap]
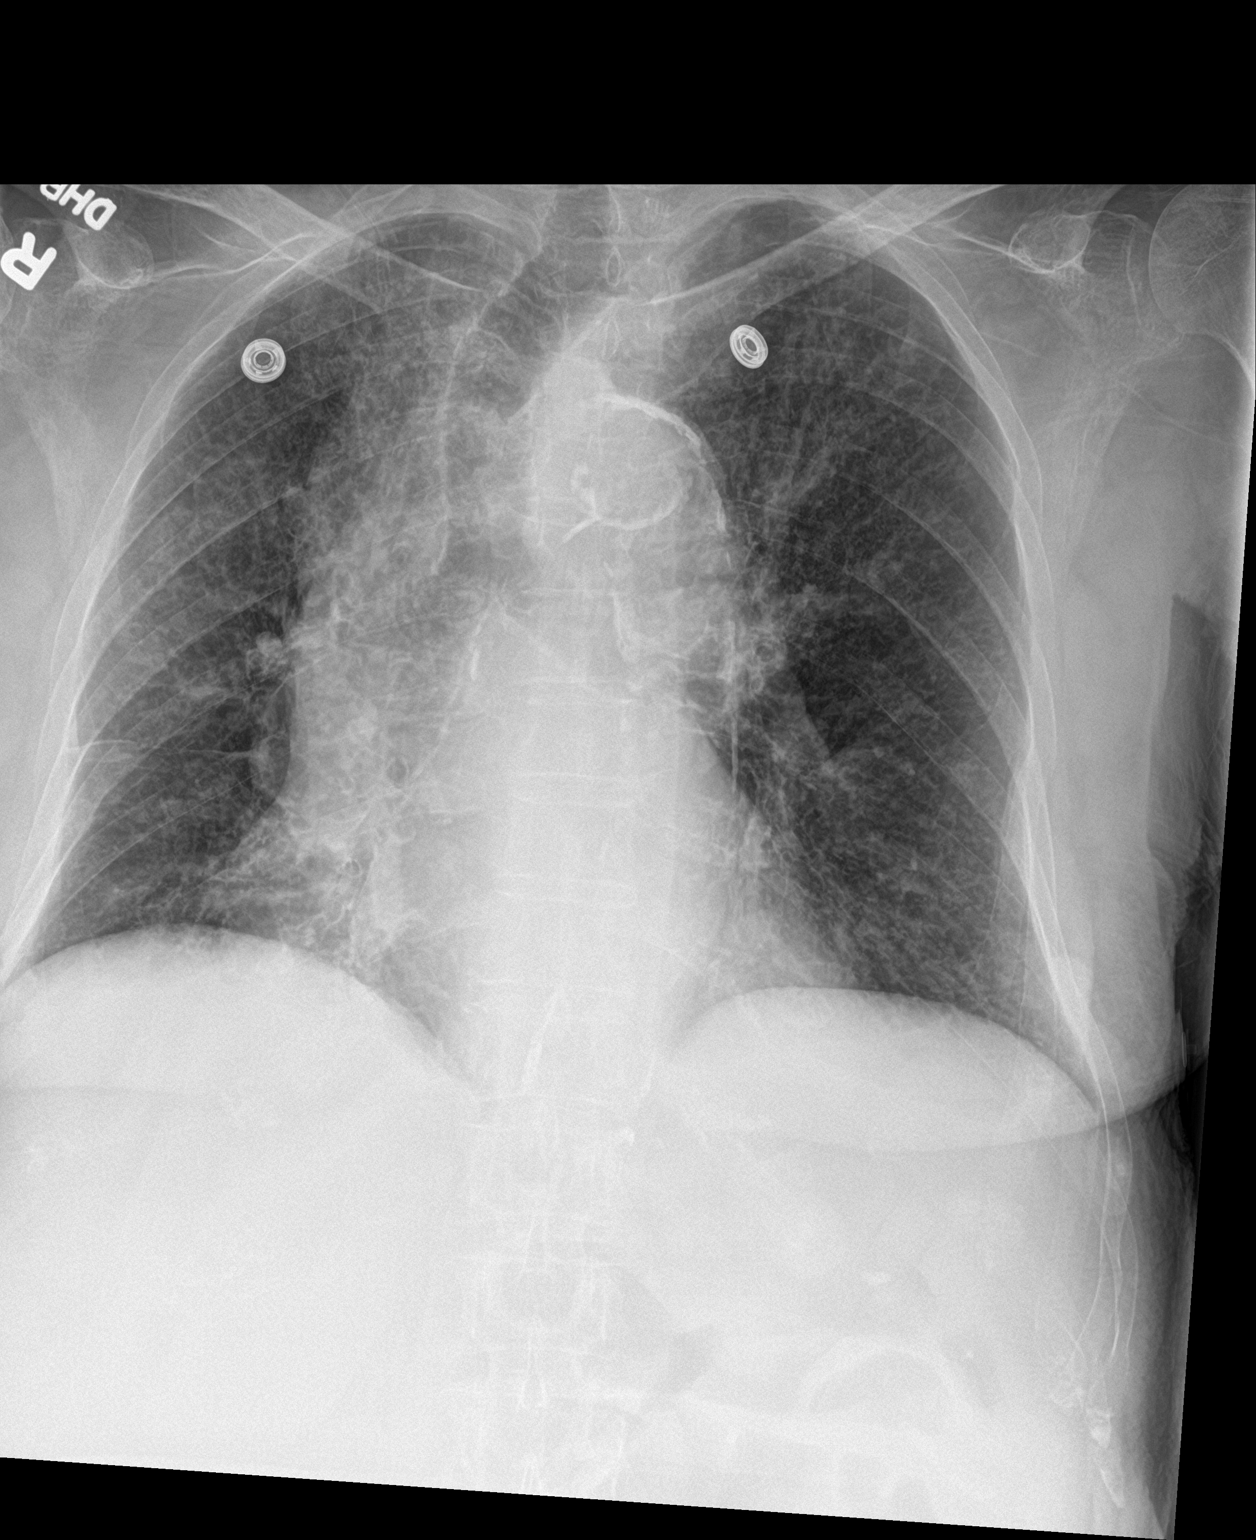

[1 of 1 positions shown; findings below may reference images not displayed]

FINDINGS: The lungs are well-aerated. Mild right midlung and right basilar
airspace opacities may reflect atelectasis or possibly mild
infection. Vascular congestion may be transient in nature. There is
no evidence of pleural effusion or pneumothorax.

The cardiomediastinal silhouette is mildly enlarged. No acute
osseous abnormalities are seen.
IMPRESSION: 1. No displaced rib fracture seen.
2. Mild right midlung and right basilar airspace opacities may
reflect atelectasis or possibly mild infection.
3. Vascular congestion may be transient in nature. Mild
cardiomegaly.

## 2018-01-23 IMAGING — CR DG HIP (WITH OR WITHOUT PELVIS) 2-3V*L*
3 series · 3 of 3 positions shown · non-contrast
Comparison: None.

CLINICAL DATA: 85-year-old female with fall and left hip pain.

EXAM:
DG HIP (WITH OR WITHOUT PELVIS) 2-3V LEFT

[pelvis ap]
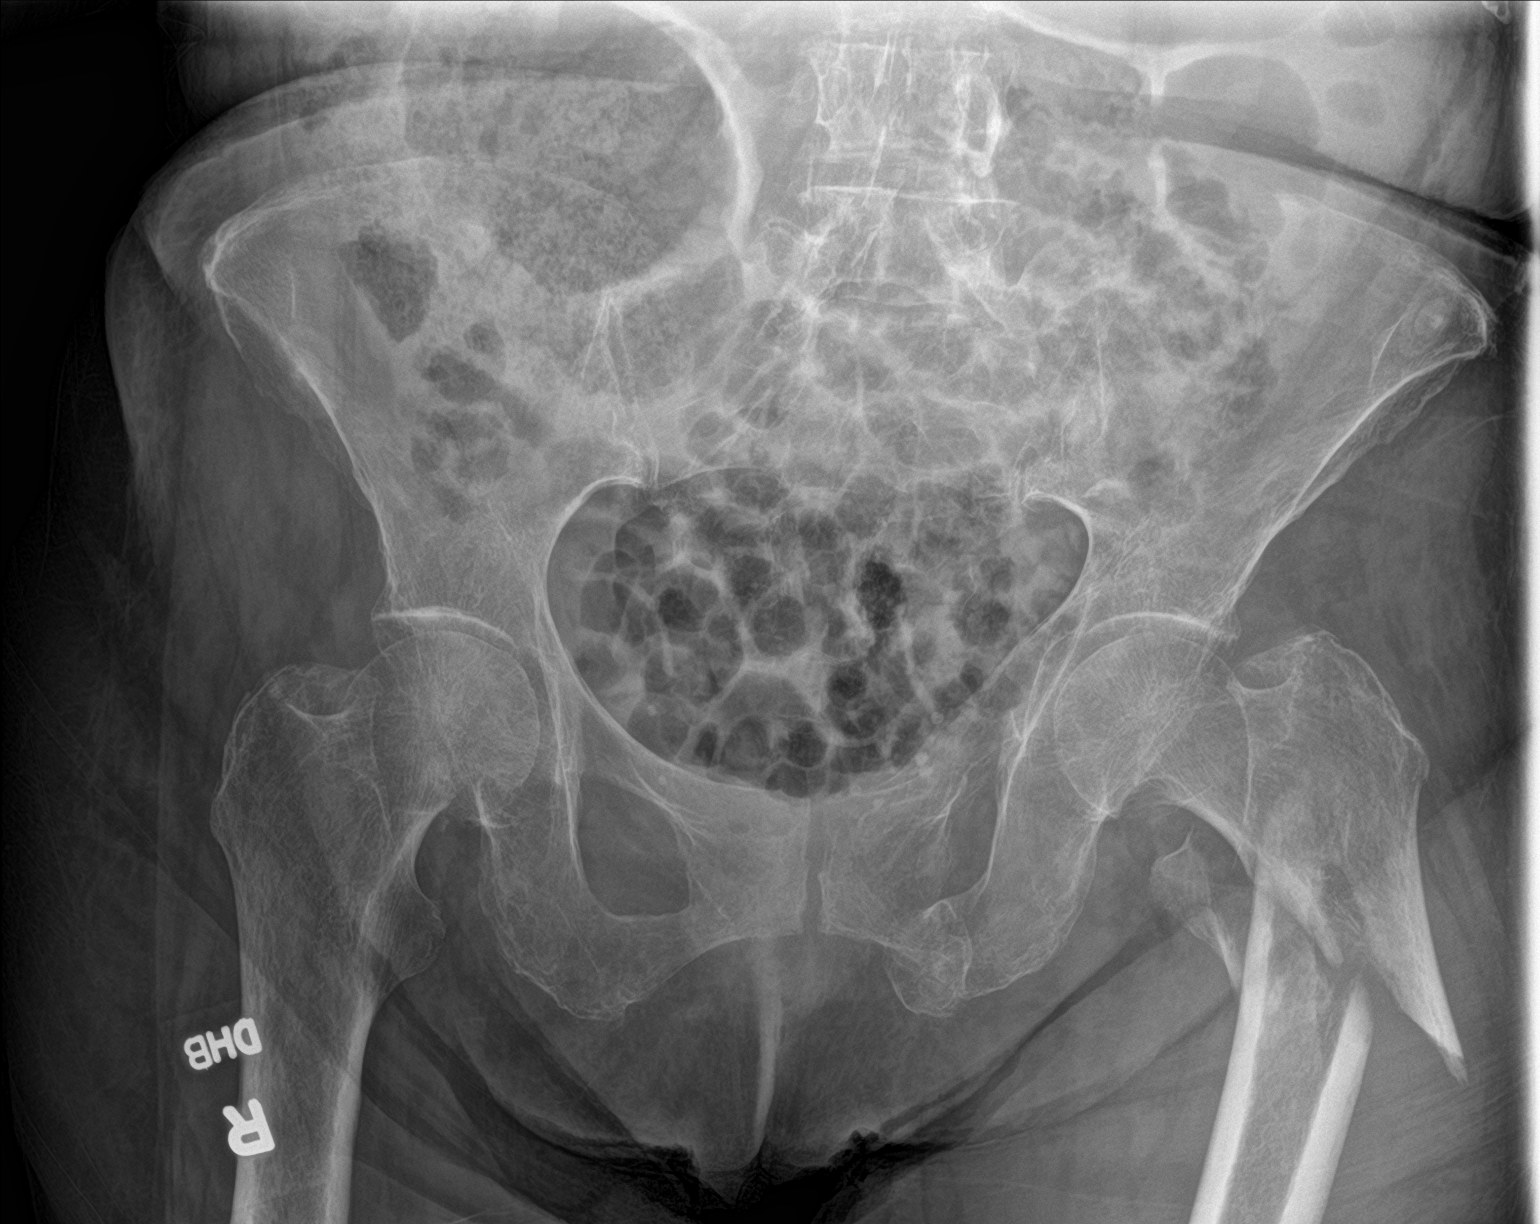

[hip ap]
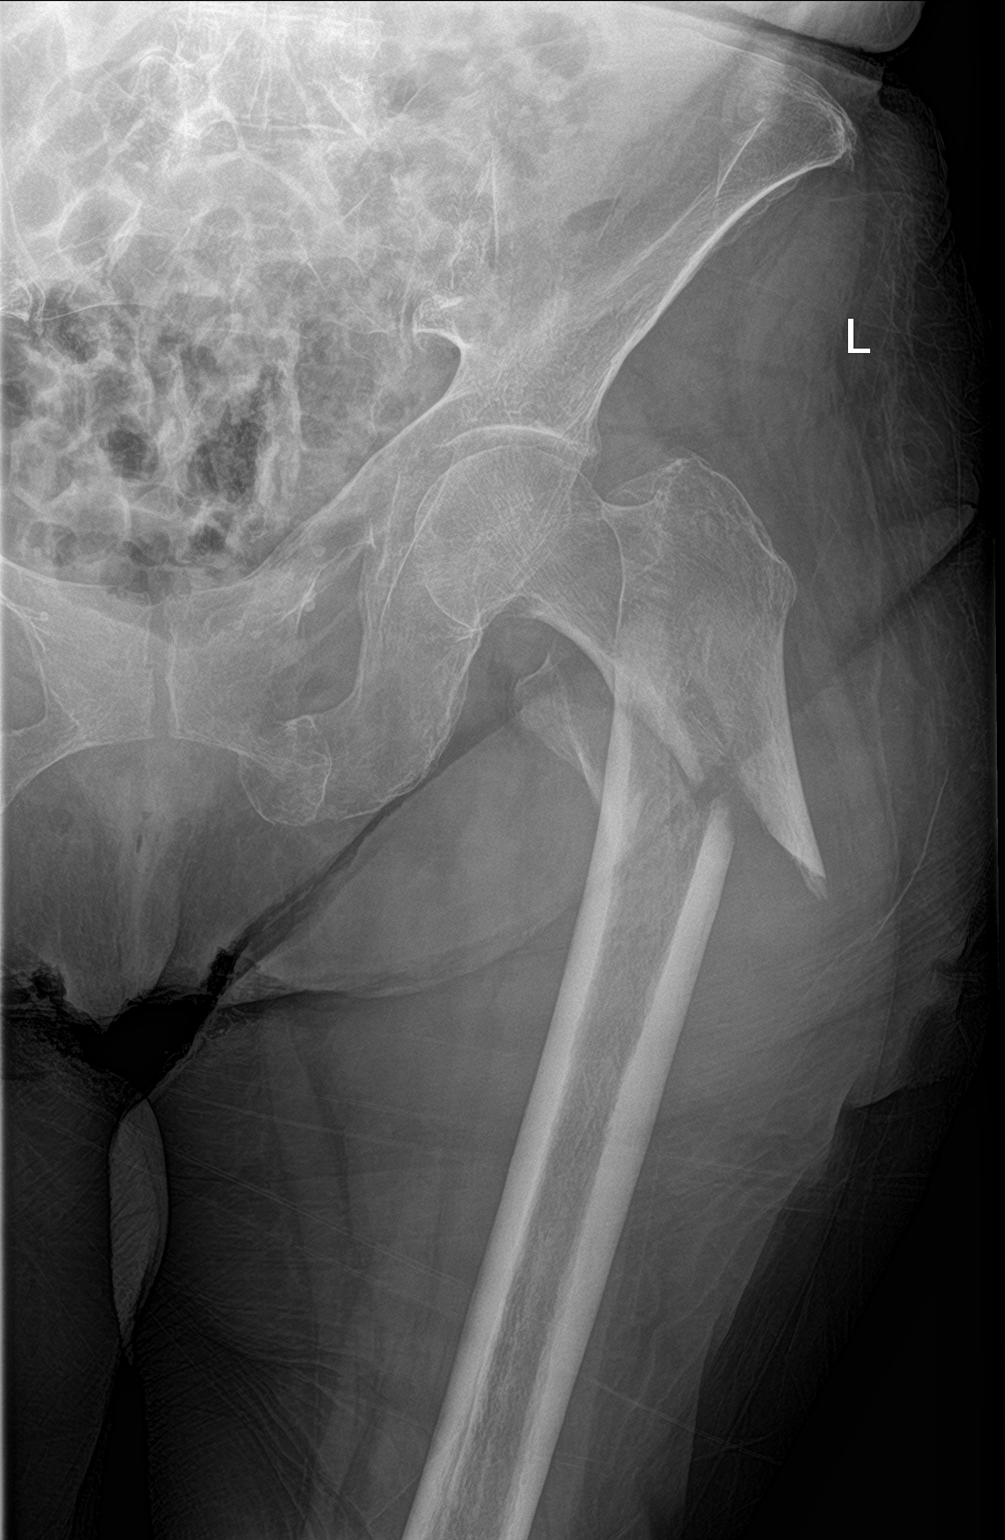

[hip lat]
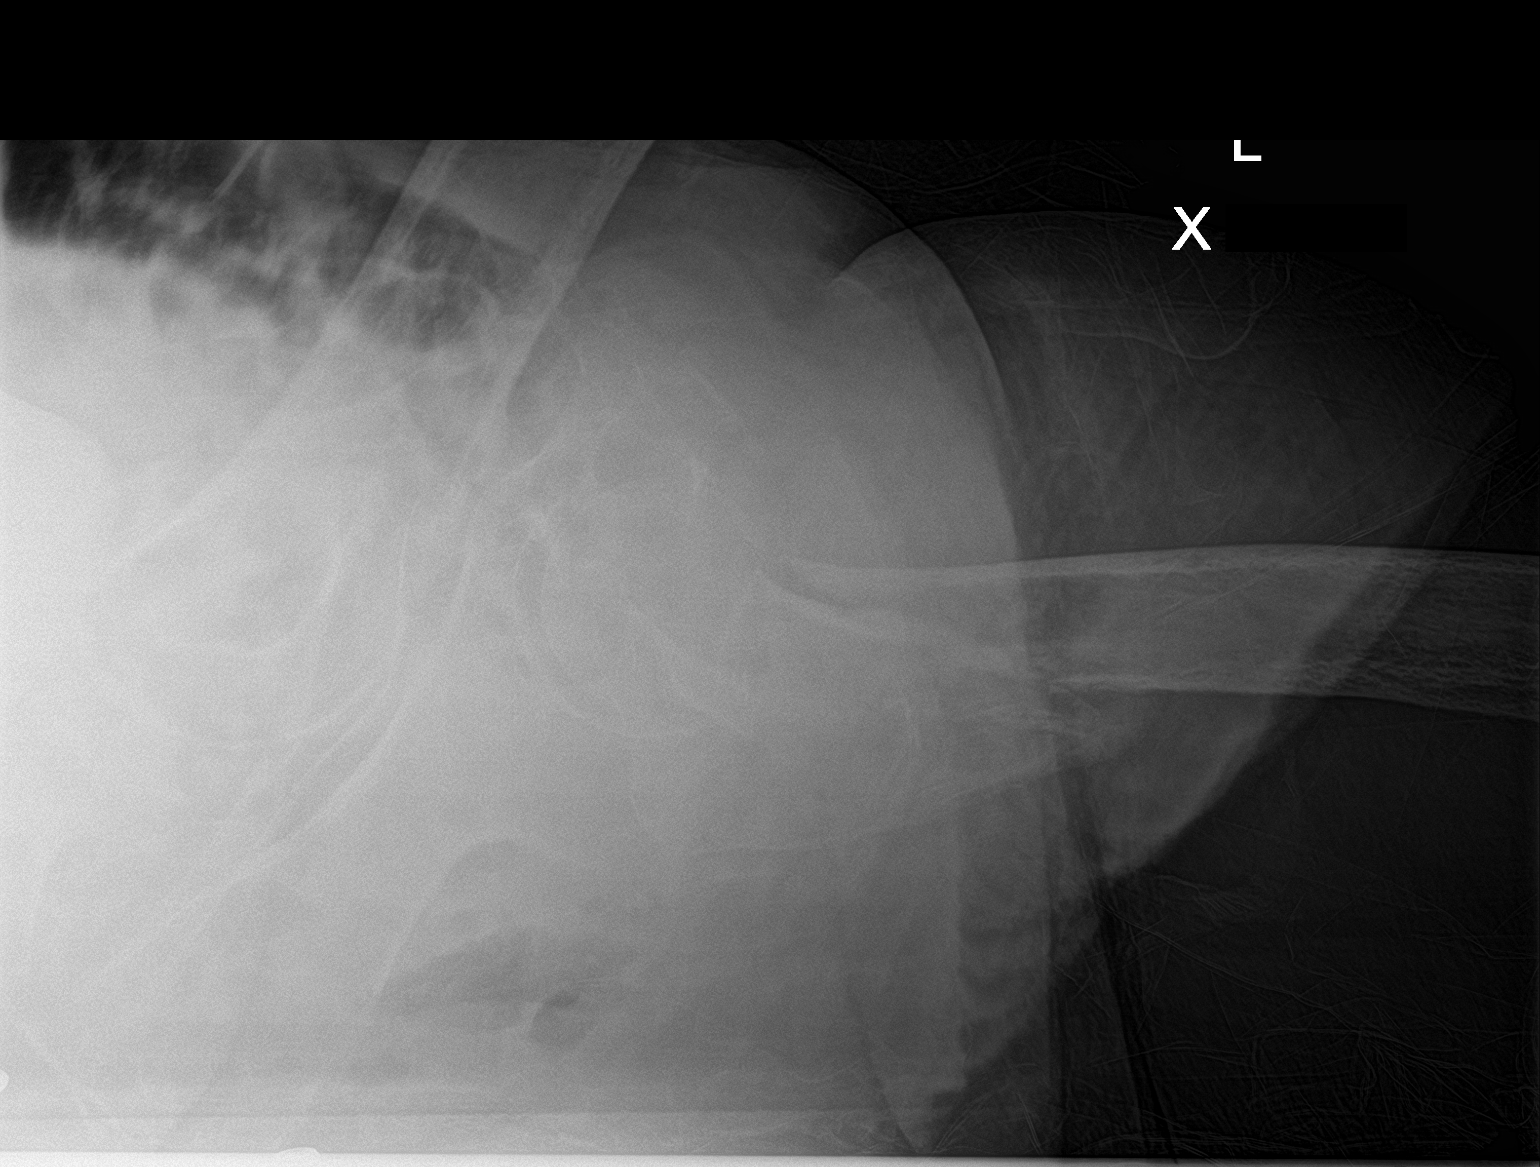

[3 of 3 positions shown; findings below may reference images not displayed]

FINDINGS: There is a comminuted fracture of the proximal left femur with
intratrochanteric and sub trochanteric fractures. There is medial
angulation of the femoral shaft in relation to the neck of the
femur. There is displaced fracture fragment of the lesser
trochanter. The left femoral head remains in anatomic alignment with
the acetabulum. No other acute fracture. Probable old fracture
deformity of the left pubic bone. The bones are osteopenic. The soft
tissues appear unremarkable.
IMPRESSION: Comminuted, displaced, and angulated fracture of the proximal left
femur with intertrochanteric and sub trochanteric involvement. No
dislocation.

## 2018-01-23 IMAGING — CR DG LUMBAR SPINE 2-3V
3 series · 3 of 3 positions shown · non-contrast
Comparison: None.

CLINICAL DATA: 85-year-old female with fall and left femoral
fracture.

EXAM:
LUMBAR SPINE - 2-3 VIEW

[l-spine ap]
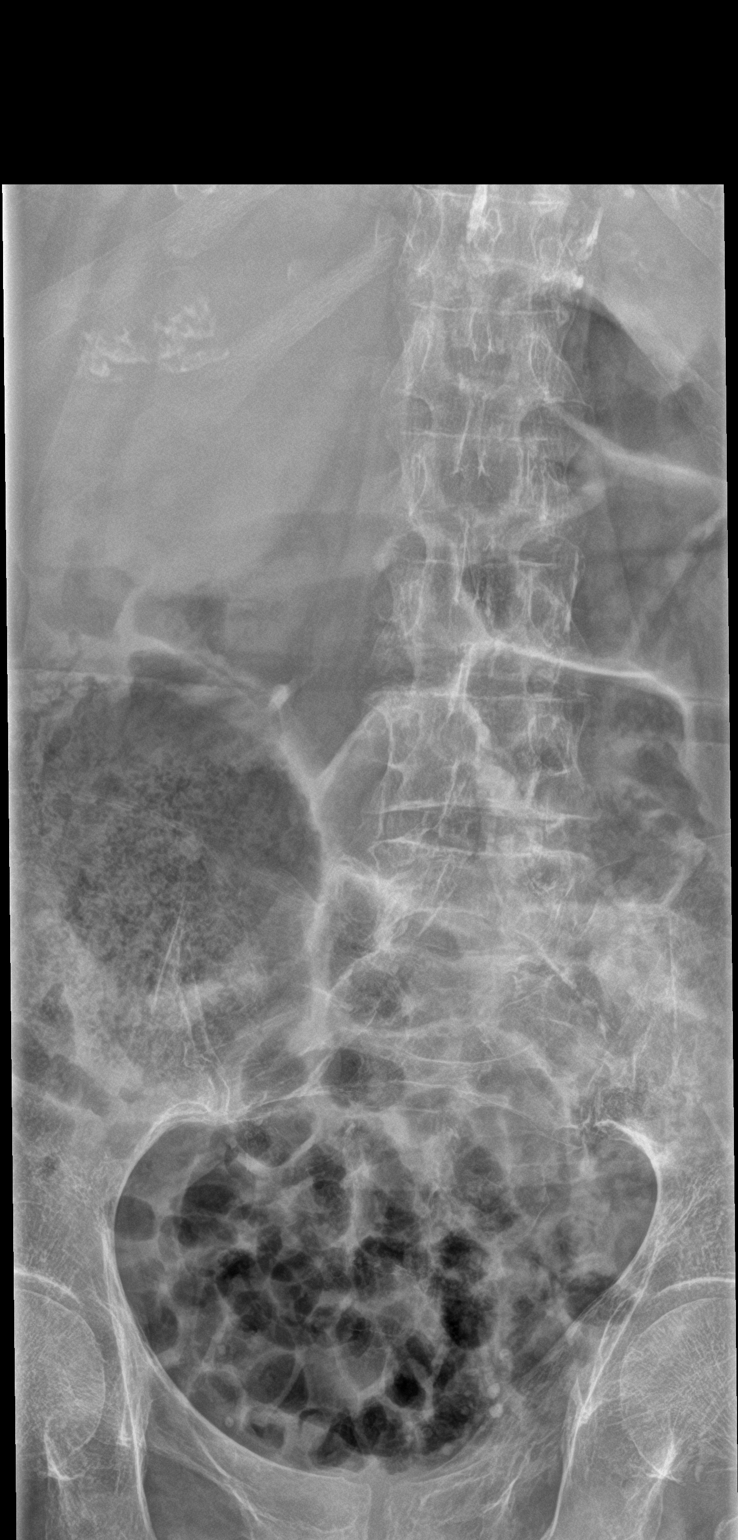

[l-spine lat]
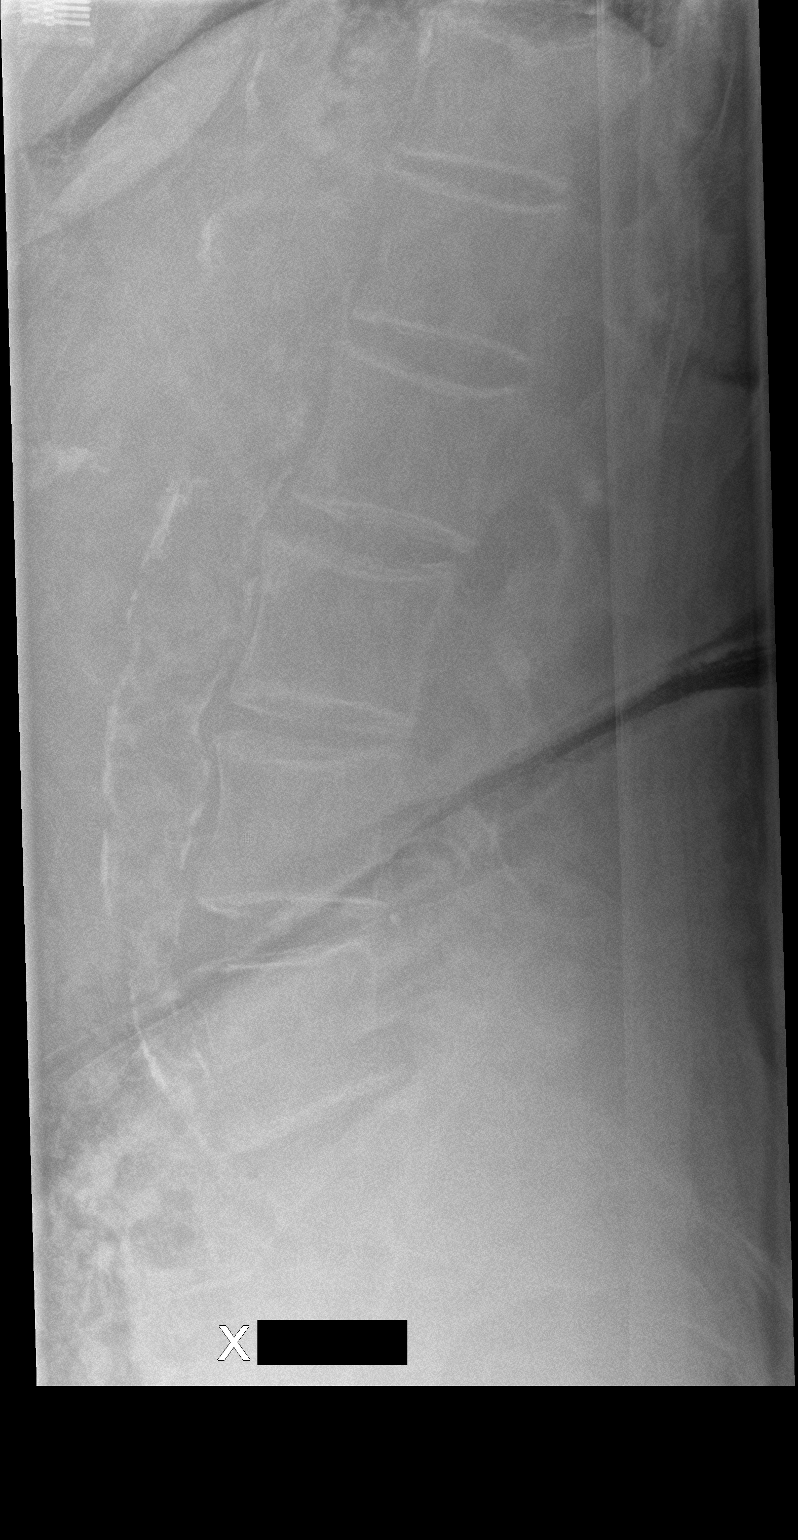

[l-spine spot]
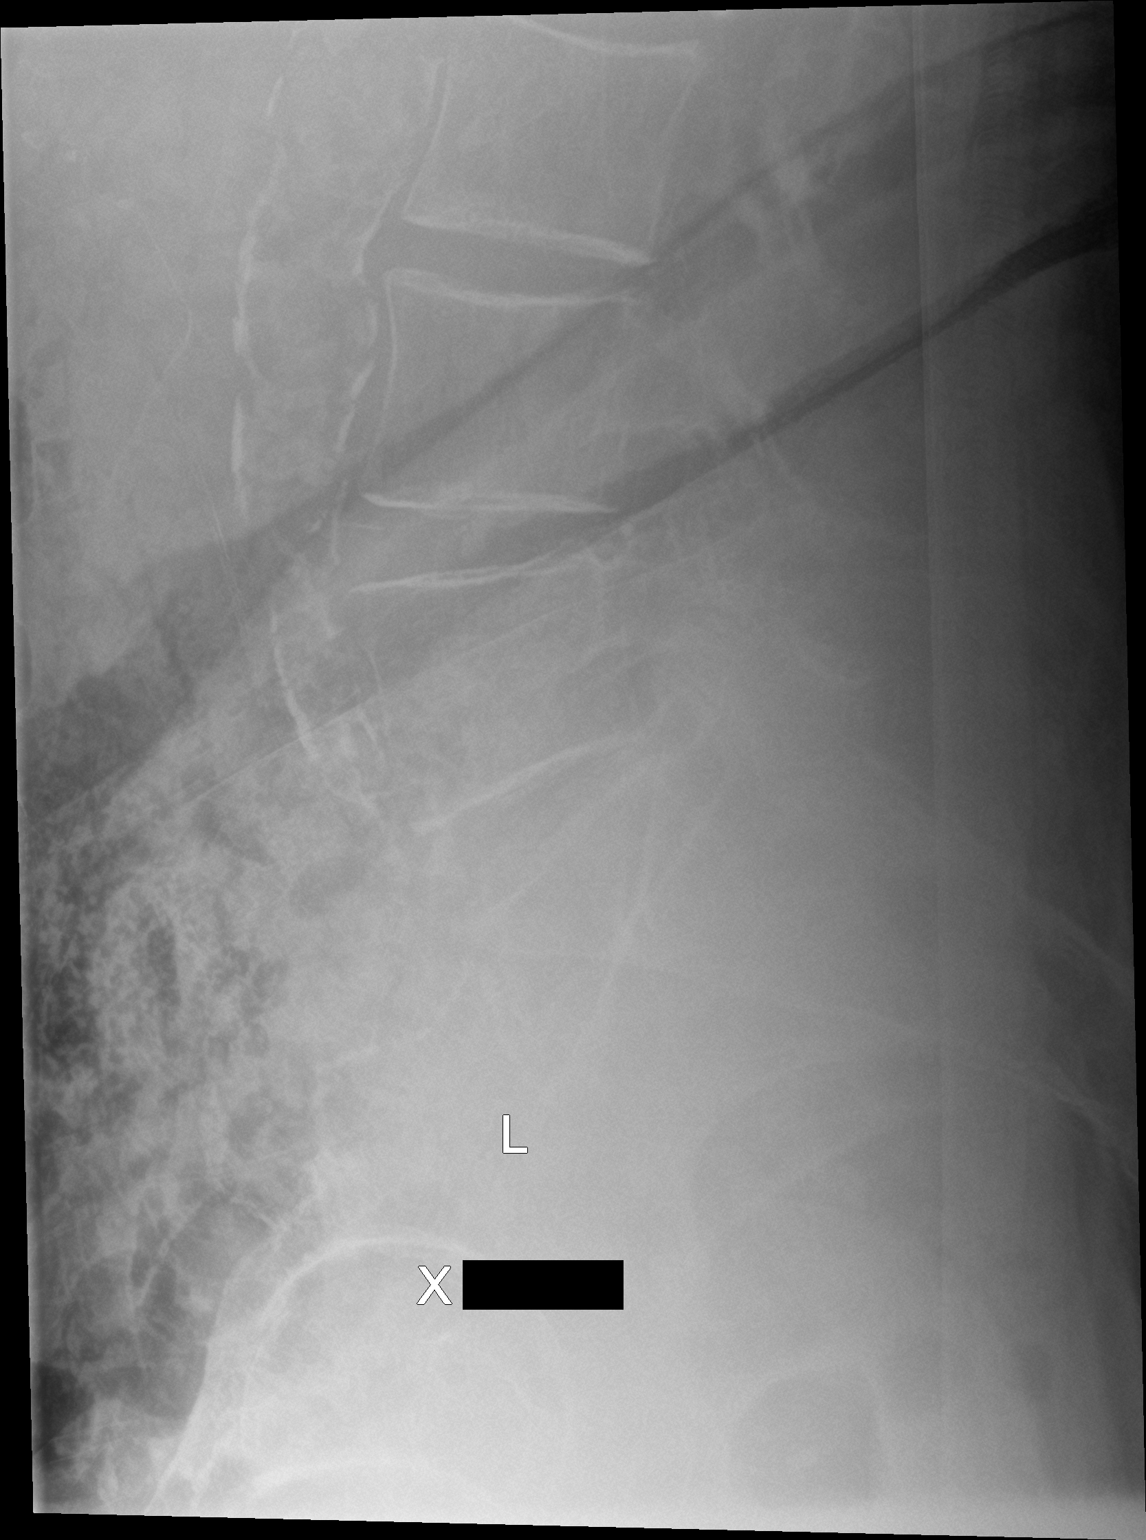

[3 of 3 positions shown; findings below may reference images not displayed]

FINDINGS: There is no acute fracture or subluxation of the lumbar spine. The
vertebral body heights are maintained. The bones are osteopenic.
There is advanced atherosclerotic calcification of the visualized
aorta.
IMPRESSION: 1. No acute fracture or subluxation of the lumbar spine.
2. Atherosclerotic calcification of the aorta.

## 2018-05-06 ENCOUNTER — Ambulatory Visit: Payer: Medicare Other
